# Patient Record
Sex: Male | Born: 1935 | Race: White | Hispanic: No | Marital: Married | Smoking: Never smoker
Health system: Southern US, Community
[De-identification: ages and names within clinical notes are randomized; demographics above are authoritative.]

## PROBLEM LIST (undated history)

## (undated) DIAGNOSIS — F419 Anxiety disorder, unspecified: Secondary | ICD-10-CM

## (undated) DIAGNOSIS — R251 Tremor, unspecified: Secondary | ICD-10-CM

## (undated) DIAGNOSIS — D62 Acute posthemorrhagic anemia: Secondary | ICD-10-CM

## (undated) DIAGNOSIS — G8929 Other chronic pain: Secondary | ICD-10-CM

## (undated) DIAGNOSIS — S02109A Fracture of base of skull, unspecified side, initial encounter for closed fracture: Secondary | ICD-10-CM

## (undated) DIAGNOSIS — M549 Dorsalgia, unspecified: Secondary | ICD-10-CM

## (undated) DIAGNOSIS — Z79899 Other long term (current) drug therapy: Secondary | ICD-10-CM

## (undated) DIAGNOSIS — F0390 Unspecified dementia without behavioral disturbance: Secondary | ICD-10-CM

## (undated) DIAGNOSIS — S065X9A Traumatic subdural hemorrhage with loss of consciousness of unspecified duration, initial encounter: Secondary | ICD-10-CM

## (undated) DIAGNOSIS — G629 Polyneuropathy, unspecified: Secondary | ICD-10-CM

## (undated) DIAGNOSIS — F329 Major depressive disorder, single episode, unspecified: Secondary | ICD-10-CM

## (undated) DIAGNOSIS — K5909 Other constipation: Secondary | ICD-10-CM

## (undated) DIAGNOSIS — N400 Enlarged prostate without lower urinary tract symptoms: Secondary | ICD-10-CM

## (undated) DIAGNOSIS — K219 Gastro-esophageal reflux disease without esophagitis: Secondary | ICD-10-CM

## (undated) DIAGNOSIS — R45851 Suicidal ideations: Secondary | ICD-10-CM

## (undated) DIAGNOSIS — S0292XA Unspecified fracture of facial bones, initial encounter for closed fracture: Secondary | ICD-10-CM

## (undated) DIAGNOSIS — E039 Hypothyroidism, unspecified: Secondary | ICD-10-CM

## (undated) DIAGNOSIS — I10 Essential (primary) hypertension: Secondary | ICD-10-CM

## (undated) DIAGNOSIS — M1990 Unspecified osteoarthritis, unspecified site: Secondary | ICD-10-CM

## (undated) DIAGNOSIS — N390 Urinary tract infection, site not specified: Secondary | ICD-10-CM

## (undated) DIAGNOSIS — F39 Unspecified mood [affective] disorder: Secondary | ICD-10-CM

## (undated) DIAGNOSIS — F32A Depression, unspecified: Secondary | ICD-10-CM

## (undated) DIAGNOSIS — C50912 Malignant neoplasm of unspecified site of left female breast: Secondary | ICD-10-CM

## (undated) HISTORY — DX: Suicidal ideations: R45.851

## (undated) HISTORY — PX: SKIN SURGERY: SHX2413

## (undated) HISTORY — PX: PROSTATE SURGERY: SHX751

## (undated) HISTORY — PX: EYE SURGERY: SHX253

## (undated) HISTORY — DX: Other constipation: K59.09

## (undated) HISTORY — DX: Unspecified fracture of facial bones, initial encounter for closed fracture: S02.92XA

## (undated) HISTORY — DX: Fracture of base of skull, unspecified side, initial encounter for closed fracture: S02.109A

## (undated) HISTORY — DX: Acute posthemorrhagic anemia: D62

## (undated) HISTORY — DX: Traumatic subdural hemorrhage with loss of consciousness of unspecified duration, initial encounter: S06.5X9A

## (undated) HISTORY — PX: APPENDECTOMY: SHX54

## (undated) HISTORY — PX: MULTIPLE TOOTH EXTRACTIONS: SHX2053

---

## 1998-10-01 ENCOUNTER — Emergency Department (HOSPITAL_COMMUNITY): Admission: EM | Admit: 1998-10-01 | Discharge: 1998-10-02 | Payer: Self-pay | Admitting: Emergency Medicine

## 1998-10-02 ENCOUNTER — Encounter: Payer: Self-pay | Admitting: Emergency Medicine

## 2000-02-24 ENCOUNTER — Encounter: Admission: RE | Admit: 2000-02-24 | Discharge: 2000-02-24 | Payer: Self-pay | Admitting: Oral Surgery

## 2000-02-24 ENCOUNTER — Encounter: Payer: Self-pay | Admitting: Oral Surgery

## 2000-02-25 ENCOUNTER — Ambulatory Visit (HOSPITAL_BASED_OUTPATIENT_CLINIC_OR_DEPARTMENT_OTHER): Admission: RE | Admit: 2000-02-25 | Discharge: 2000-02-25 | Payer: Self-pay | Admitting: Oral Surgery

## 2000-10-18 ENCOUNTER — Emergency Department (HOSPITAL_COMMUNITY): Admission: EM | Admit: 2000-10-18 | Discharge: 2000-10-18 | Payer: Self-pay | Admitting: Internal Medicine

## 2000-10-18 ENCOUNTER — Encounter: Payer: Self-pay | Admitting: Internal Medicine

## 2000-11-18 ENCOUNTER — Encounter: Admission: RE | Admit: 2000-11-18 | Discharge: 2000-11-18 | Payer: Self-pay | Admitting: Family Medicine

## 2000-11-18 ENCOUNTER — Encounter: Payer: Self-pay | Admitting: Family Medicine

## 2000-11-25 ENCOUNTER — Encounter: Payer: Self-pay | Admitting: Family Medicine

## 2000-11-25 ENCOUNTER — Encounter: Admission: RE | Admit: 2000-11-25 | Discharge: 2000-11-25 | Payer: Self-pay | Admitting: Family Medicine

## 2001-09-04 ENCOUNTER — Ambulatory Visit (HOSPITAL_COMMUNITY): Admission: RE | Admit: 2001-09-04 | Discharge: 2001-09-04 | Payer: Self-pay | Admitting: Family Medicine

## 2001-09-04 ENCOUNTER — Encounter: Payer: Self-pay | Admitting: Family Medicine

## 2002-10-24 ENCOUNTER — Emergency Department (HOSPITAL_COMMUNITY): Admission: EM | Admit: 2002-10-24 | Discharge: 2002-10-24 | Payer: Self-pay | Admitting: Emergency Medicine

## 2002-10-24 ENCOUNTER — Encounter: Payer: Self-pay | Admitting: Emergency Medicine

## 2007-12-18 ENCOUNTER — Emergency Department (HOSPITAL_COMMUNITY): Admission: EM | Admit: 2007-12-18 | Discharge: 2007-12-18 | Payer: Self-pay | Admitting: Emergency Medicine

## 2009-07-02 ENCOUNTER — Encounter: Admission: RE | Admit: 2009-07-02 | Discharge: 2009-07-02 | Payer: Self-pay | Admitting: Internal Medicine

## 2009-07-14 ENCOUNTER — Ambulatory Visit (HOSPITAL_COMMUNITY): Admission: RE | Admit: 2009-07-14 | Discharge: 2009-07-14 | Payer: Self-pay | Admitting: Internal Medicine

## 2010-08-07 NOTE — Op Note (Signed)
[place]. [facility]  Patient:    George Foster, George Foster                        MRN: [id number] Proc. Date: 02/25/00 Adm. Date:  [id number] Attending:  Retia Passe                           Operative Report  PREOPERATIVE DIAGNOSIS:  Nonrestorable teeth numbers 2, 4, 6, 12, 13, 18, 29, 20, 28, 29, mandibular right and left tori, mandibular anterior alveolar exostoses, maxillary right and left buccal exostoses.  POSTOPERATIVE DIAGNOSIS:  Nonrestorable teeth numbers 2, 4, 6, 12, 13, 18, 29, 20, 28, 29, mandibular right and left tori, mandibular anterior alveolar exostoses, maxillary right and left buccal exostoses.  OPERATION PERFORMED:  Removal of the above teeth, production of the maxillary and mandibular exostoses and tori.  SURGEON:  Vania Rea. Warren Danes, D.D.S.  ASSISTANT:  Gaynell Face.  ANESTHESIA:  General orotracheal.  ESTIMATED BLOOD LOSS:  Less than 50 cc.  FLUID REPLACEMENT:  Approximately 1000 cc crystalloid solution.  COMPLICATIONS:  None.  INDICATIONS FOR PROCEDURE:  The patient is a 77 year old gentleman who was referred to my office for evaluation and removal of the above teeth and reduction of the bony growths of the maxilla and mandible.  Due to the extent of the exostoses and tori, the procedure was performed at the [facility] under general anesthesia where the airway could be maintained during removal of the bony exostoses and tori.  DESCRIPTION OF PROCEDURE:  On February 25, 2000, George Foster was taken to the [facility] where he was placed on the operating table in a supine position.  Following successful oral endotracheal intubation and general anesthesia, the patients face, neck and oral cavity were prepped and draped in the usual sterile operating room fashion.  The hypopharynx was suctioned free of fluids and secretions, and a moistened 2 inch vaginal pack was placed as a throat pack.   Attention was then directed intraorally, where approximately 10 cc of 0.5% Xylocaine containing 1:200,000 epinephrine were infiltrated in the maxillary buccal soft tissues, the maxillary palatal soft tissues, and the right and left inferior alveolar neurovascular regions.  A #15 Bard-Parker blade was then used to create a full thickness mucoperiosteal incision along the crest of the alveolar gingival tissues beginning in the right posterior maxilla and brought across the midline and through the posterior left maxilla.  A full thickness mucoperiosteal flap was elevated laterally and palatally exposing the alveolar bone.  Multiple large exostoses were noted.  The maxillary teeth were then subluxated from the alveolus using an 11 A elevator, removed from the oral cavity using a 150 dental forcep.  The multiple exostoses were then reduced using a Stryker rotary osteotome and a #8 round bur followed by an alveolar bur.  The area was then thoroughly irrigated with sterile saline irrigating solutions and suctioned.  Mucoperiosteal margins were approximated and sutured in an interrupted fashion using 4-0 chromic suture material in a continuous interlocking fashion.  In a similar fashion, the mandibular teeth were removed, the exostoses and tori reduced.  Immediate dentures were then placed and found to fit appropriately.  The oral cavity was thoroughly suctioned and the throat pack removed.  The hypopharynx was suctioned free of fluids and secretions.  The patient was allowed to awaken from the anesthesia and taken to the recovery room  where he tolerated the procedure well and without apparent complication. DD:  02/25/00 TD:  02/25/00 Job: [id number]

## 2010-12-21 LAB — URINALYSIS, ROUTINE W REFLEX MICROSCOPIC
Glucose, UA: NEGATIVE
Hgb urine dipstick: NEGATIVE
Ketones, ur: NEGATIVE
Nitrite: NEGATIVE
Protein, ur: NEGATIVE
Specific Gravity, Urine: 1.017
Urobilinogen, UA: 0.2

## 2010-12-21 LAB — BASIC METABOLIC PANEL
BUN: 14
CO2: 27
Calcium: 8.8
Chloride: 108
Creatinine, Ser: 0.93
GFR calc Af Amer: >60
GFR calc non Af Amer: >60
Glucose, Bld: 108 — ABNORMAL HIGH

## 2010-12-21 LAB — DIFFERENTIAL
Basophils Absolute: 0
Eosinophils Absolute: 0.1
Eosinophils Relative: 2
Monocytes Absolute: 0.3

## 2010-12-21 LAB — CBC: RBC: 3.55 — ABNORMAL LOW

## 2010-12-21 LAB — URINE CULTURE: Colony Count: NO GROWTH

## 2010-12-24 ENCOUNTER — Emergency Department (HOSPITAL_COMMUNITY): Payer: Medicare Other

## 2010-12-24 ENCOUNTER — Observation Stay (HOSPITAL_COMMUNITY)
Admission: EM | Admit: 2010-12-24 | Discharge: 2010-12-28 | Disposition: A | Discharge status: Home / Self Care | Payer: Medicare Other | Attending: Internal Medicine | Admitting: Internal Medicine

## 2010-12-24 DIAGNOSIS — R404 Transient alteration of awareness: Principal | ICD-10-CM | POA: Insufficient documentation

## 2010-12-24 DIAGNOSIS — G319 Degenerative disease of nervous system, unspecified: Secondary | ICD-10-CM | POA: Insufficient documentation

## 2010-12-24 DIAGNOSIS — I10 Essential (primary) hypertension: Secondary | ICD-10-CM | POA: Insufficient documentation

## 2010-12-24 DIAGNOSIS — N179 Acute kidney failure, unspecified: Secondary | ICD-10-CM | POA: Insufficient documentation

## 2010-12-24 DIAGNOSIS — E039 Hypothyroidism, unspecified: Secondary | ICD-10-CM | POA: Insufficient documentation

## 2010-12-24 DIAGNOSIS — F411 Generalized anxiety disorder: Secondary | ICD-10-CM | POA: Insufficient documentation

## 2010-12-24 DIAGNOSIS — Z79899 Other long term (current) drug therapy: Secondary | ICD-10-CM | POA: Insufficient documentation

## 2010-12-24 DIAGNOSIS — G8929 Other chronic pain: Secondary | ICD-10-CM | POA: Insufficient documentation

## 2010-12-24 DIAGNOSIS — M549 Dorsalgia, unspecified: Secondary | ICD-10-CM | POA: Insufficient documentation

## 2010-12-24 LAB — URINALYSIS, ROUTINE W REFLEX MICROSCOPIC
Bilirubin Urine: NEGATIVE
Glucose, UA: NEGATIVE mg/dL
Hgb urine dipstick: NEGATIVE
Leukocytes, UA: NEGATIVE
pH: 6 (ref 5.0–8.0)

## 2010-12-24 LAB — DIFFERENTIAL
Eosinophils Relative: 1 % (ref 0–5)
Lymphocytes Relative: 15 % (ref 12–46)
Neutro Abs: 3.4 10*3/uL (ref 1.7–7.7)

## 2010-12-24 LAB — CBC
HCT: 33.6 % — ABNORMAL LOW (ref 39.0–52.0)
Hemoglobin: 11.4 g/dL — ABNORMAL LOW (ref 13.0–17.0)
MCH: 32.9 pg (ref 26.0–34.0)
MCHC: 33.9 g/dL (ref 30.0–36.0)
MCV: 96.8 fL (ref 78.0–100.0)
RBC: 3.47 MIL/uL — ABNORMAL LOW (ref 4.22–5.81)
RDW: 12.8 % (ref 11.5–15.5)

## 2010-12-24 LAB — COMPREHENSIVE METABOLIC PANEL
BUN: 20 mg/dL (ref 6–23)
CO2: 25 mEq/L (ref 19–32)
Calcium: 9.4 mg/dL (ref 8.4–10.5)
Chloride: 102 mEq/L (ref 96–112)
GFR calc Af Amer: 51 mL/min — ABNORMAL LOW (ref >90)
Sodium: 137 mEq/L (ref 135–145)
Total Bilirubin: 0.4 mg/dL (ref 0.3–1.2)

## 2010-12-25 ENCOUNTER — Observation Stay (HOSPITAL_COMMUNITY): Payer: Medicare Other

## 2010-12-25 LAB — TSH: TSH: 0.68 u[IU]/mL (ref 0.350–4.500)

## 2010-12-26 DIAGNOSIS — F411 Generalized anxiety disorder: Secondary | ICD-10-CM

## 2010-12-31 NOTE — Discharge Summary (Signed)
[name], [name] NO.:  [id number]  MEDICAL RECORD NO.:  [id number]  LOCATION:  1511                         FACILITY:  [facility]  PHYSICIAN:  Baltazar Najjar, MD     DATE OF BIRTH:  25-Nov-1924  DATE OF ADMISSION:  12/24/2010 DATE OF DISCHARGE:                              DISCHARGE SUMMARY   DISCHARGE DIAGNOSES: 1. Altered mental status secondary to polypharmacy. 2. Acute kidney injury. 3. Chronic back pain. 4. Anxiety. 5. History of hypothyroidism.  CONSULTATIONS:  Psychiatric consult.  The patient was seen by Dr. Lolly Mustache psychiatric service.  RADIOLOGY/IMAGING STUDIES:  Head CT showed negative for blood or any other acute intracranial process.  Atrophy and nonspecific white matter changes.  BRIEF ADMITTING HISTORY:  Please refer to H and P for more details.  On summary, [name] is an 77 year old man with history of chronic back pain on methadone.  He also has history of anxiety and takes Xanax, presented to the ER with altered mental status.  Please see H and P for more details.  H and P stated that the patient was brought into the hospital by his wife; however his wife with who is an RN told me that the patient was found wandering in the streets and was brought in by EMS.  As per wife also, he had been having problems with his memory in the past which was described as mild.  HOSPITAL COURSE: 1. Altered mental status thought to be secondary to polypharmacy.  We     adjusted his medication.  I decreased his Xanax to 0.5 mg p.o.     twice a day instead of 3 times a day.  I also decreased his     tramadol to 25 mg t.i.d. instead of 50 mg t.i.d.  We decreased his     gabapentin from 600 mg q.h.s. to 400 q.h.s.  We kept him on the     methadone and his wife does not want his dose to be changed.  With     this approach, the patient's mental status significantly improved.     He is alert, oriented x2, and his confusion was probably secondary     to  combination of those medications.  His head CT showed no acute     events.  MRI was ordered; however, that was not done given the     patient was claustrophobic and refused MRI.  He also was found to     have normal neurological exam with no focal deficits and his mental     status started to improve with adjustment of his medicine.  Psychiatric service was consulted and the patient was noted to have capacity to make his own decision and to be involved in his treatment. He was also found to have to both no threat to himself as Psych consultation.  His wife was initially was very upset and wanted to take the patient again to home against medical advise.  She was not happy that he was admitted and stated that her brother stays at home and watch him when she goes to work.  George Foster initially stated, when I spoke  to her on the phone, when I was phoned by RN that she does not want to wait for me to come in to see the patient and she was planning to take him immediately to home, even though I explained to her I will be coming shortly to see the patient.  So, according to that, I spoke to the Neurosurgeon, George Foster and I also spoke to George Foster with risk management to explain the situation.  The patient was seen by Psych and noted to have capacity as above and I also involved the social worker to rule out any kind of elderly abuse at home.  I do not really have any specific event to confirm that, however, the patient was found wandering on the street and I want to make sure we take all the measures to ensure his safety.  Noted that, afterwards the wife decided to keep him in the hospital, she was okay by me seeing the patient, so I saw the patient on rounds yesterday.  Please see progress note dated, 10/05.  At that time, the patient was still confused and I requested the psych consultation to check on his capacity and to see if they can help [place] with adjusting  his medication.  I also called his doctor, Dr. Murray Hodgkins, I left a message for him to discuss his case; however, I have not heard back from him yet.  Yesterday, as per staff, the patient needed 2 person assist to move him from the chair to the bed.  So, I requested PT/OT consultation which is still pending at the time of dictation.  As per wife and nursing staff today, the patient was able to ambulate with 1 person assist and his ambulation was much better; however, I still would like PT/OT to see him to see if he will need any kind of physical therapy at home prior to discharge.  We will discuss with the social worker for following that and arrange for surface as needed at home and also to offer the wife any kind of home health aide and to ensure the safety of the patient at home.  The patient seen and examined by me today.  He is alert, oriented x2, I am not sure what his baseline orientation, noted that he has history of memory issues in the past.  He denies any complaints.  His exam is unremarkable.  His vital signs, blood pressure 117/54, pulse rate of 68, temperature 98.2, O2 sat 96% on room air.  I also called his wife and discussed his status with her and she would like him to be discharged.  The patient is also requesting discharge home today.  I will discharge him today after his being seen by PT/OT.  1. Acute kidney injury.  The patient's creatinine was found to be at     1.4 on admission.  His baseline creatinine was normal prior to     that.  The patient was on Diovan at home that was held on admission     and I will discharge him on amlodipine 5 mg p.o. daily.  He will     need repeat basic metabolic panel to reassess his creatinine and if     his renal function is stabilized, he can be placed back on his     Diovan if indicated by his PCP.  DISCHARGE MEDICATION: 1. Amlodipine 5 mg p.o. daily. 2. Colace 100 mg p.o. twice daily. 3. MiraLax 17 grams  p.o. daily as  needed. 4. Xanax 0.5 mg p.o. twice daily. 5. Gabapentin 400 mg p.o. daily at bedtime. 6. Tramadol 50 mg half a tablet p.o. q.8 h. p.r.n. 7. Dutasteride-tamsulosin 1 tablet p.o. daily. 8. Haldol 0.5 mg 1 tablet p.o. twice daily. 9. Synthroid 100 mcg 1 tablet p.o. daily. 10.Methadone 10 mg tablets 1-1/2 to 2 tablets p.o. 3 times a day as     instructed.  As per wife, he takes 2 tablets in the morning, 1-1/2     tablet in the afternoon, and 1-1/2 tablet in the evening. 11.Omeprazole 40 mg p.o. daily.  DISCHARGE INSTRUCTIONS: 1. The patient to continue above medications as prescribed. 2. The patient to follow with his PCP ASAP or preferably within 1 week     of discharge. 3. The patient will need repeat basic metabolic panel to reassess his     kidney function. 4. Social worker to address home situation and to arrange for any     social worker/to address any home situation to ensure the patient's     safety and case manager to arrange for any home services that the     patient might need. 5. PT/OT to see the patient prior to discharge. 6. The patient/ wife to report any worsening of symptoms or any new     symptoms to PCP or come to the ER if needed.  CONDITION ON DISCHARGE:  Stable.          ______________________________Sosan Cleotis Lema, MD     SA/MEDQ  D:  12/27/2010  T:  12/27/2010  Job:  [id number]  cc:   Lenon Curt. Chilton Si, M.D. Fax: [phone]  Electronically Signed by Hannah Beat MD on 12/31/2010 08:23:11 AM

## 2011-01-06 NOTE — H&P (Signed)
NAME:  George Foster, George Foster NO.:  [id number]  MEDICAL RECORD NO.:  [id number]  LOCATION:  WLED                         FACILITY:  [facility]  PHYSICIAN:  Houston Siren, MD           DATE OF BIRTH:  Aug 23, 1924  DATE OF ADMISSION:  12/24/2010 DATE OF DISCHARGE:                             HISTORY & PHYSICAL   PRIMARY CARE PHYSICIAN:  Lenon Curt. Chilton Si, M.D.  ADVANCED DIRECTIVE:  Full code.  REASON FOR ADMISSION:  Altered mental status felt to be secondary to medication.  HISTORY OF PRESENT ILLNESS:  This is an 77 year old male with history of chronic severe back pain, anxiety, and hypothyroidism, brought in by his wife who is an Charity fundraiser for [employer] because of increased confusion today. She did say that he was having problem with his memory intermittently in the past, but it has been mild.  There has been no fever, chills, headache, or any focal neurological deficit.  It should be noted that he has been on methadone along with Xanax, allopurinol, and tramadol on a chronic basis. Generally, his wife gives him the medication.  Workup in emergency room included a head CT which was negative except for atrophy. Liver function tests, white count, and urinalysis were all normal.  His creatinine is 1.42 and blood sugar is 124.  Hospitalist was asked to admit the patient for observation because of transient worsening of his mental status.  He has no chest pain, shortness of breath, fever, chills, or any other symptomatology.  PAST MEDICAL HISTORY: 1. Hypothyroidism. 2. Anxiety. 3. Chronic back pain.  CURRENT MEDICATIONS: 1. Methadone 10 mg 2 tablets q.a.m., half tablet q.p.m., half tablet     q.h.s. 2. Xanax 0.5 mg t.i.d. 3. Allopurinol 0.5 mg b.i.d. 4. Diovan 160 mg per day. 5. Gabapentin. 6. Levoxyl 100 mcg per day. 7. Omeprazole. 8. Tramadol.  FAMILY HISTORY:  Noncontributory.  SOCIAL HISTORY:  He lives at home with his wife who is an Charity fundraiser.  SOCIAL HISTORY:  Denied  tobacco, alcohol, or drug use.  PHYSICAL EXAMINATION:  VITAL SIGNS:  Blood pressure 123/63, pulse of 85, respiratory rate of 23, temperature 98.4. GENERAL:  He is alert and oriented, knows that he is at [facility] and was able to tell a story that he was confused today trying to find his check book, when outside trying to get attention from a neighbor.  His story matched out okay and he knows that his wife is an Charity fundraiser.  He does have facial symmetry and fluent speech.  Tongue is midline. NECK:  Supple. HEENT:  Pupils equal, round, and reactive.  No carotid bruit. CARDIAC EXAM:  Revealed S1 and S2 regular. LUNGS:  Clear. EXTREMITIES:  His strength is equal bilaterally.  No calf tenderness. SKIN:  Warm and dry.  There is no rash. NEUROLOGIC:  Babinski is negative.  Strength is equal.  No asterixis.  LABORATORY STUDIES:  Serum sodium 137, potassium 4.8, glucose 124, creatinine 1.42, calcium of 9.4.  Urinalysis is negative.  White count 4,300, hemoglobin of 11.4, platelet count of 158,000.  Liver function tests are normal.  Head CT shows atrophy.  IMPRESSION:  This is an 77 year old with baseline mental status with memory loss, presented with acute transient confusion.  I suspect that medication-induced confusion is high on the list.  He is on methadone, Xanax, allopurinol, and tramadol.  We will admit.  I would like to at least get B12, RPR, and TSH.  We are trying to minimize his medication and discontinue those sedative ones.  I will reduce methadone to 5 mg t.i.d.  We will try to use Haldol if he is very agitated.  He is stable, otherwise, full code, and will be admitted to [facility] Rayfield Citizen, MD     PL/MEDQ  D:  12/24/2010  T:  12/24/2010  Job:  [id number]  Electronically Signed by Houston Siren  on 01/06/2011 02:00:29 AM

## 2011-01-21 ENCOUNTER — Emergency Department (HOSPITAL_COMMUNITY): Payer: Medicare Other

## 2011-01-21 ENCOUNTER — Emergency Department (HOSPITAL_COMMUNITY)
Admission: EM | Admit: 2011-01-21 | Discharge: 2011-01-21 | Disposition: A | Discharge status: Home / Self Care | Payer: Medicare Other | Attending: Internal Medicine | Admitting: Internal Medicine

## 2011-01-21 DIAGNOSIS — J189 Pneumonia, unspecified organism: Secondary | ICD-10-CM | POA: Insufficient documentation

## 2011-01-21 DIAGNOSIS — R259 Unspecified abnormal involuntary movements: Secondary | ICD-10-CM | POA: Insufficient documentation

## 2011-01-21 DIAGNOSIS — R4182 Altered mental status, unspecified: Secondary | ICD-10-CM | POA: Insufficient documentation

## 2011-01-21 DIAGNOSIS — E860 Dehydration: Secondary | ICD-10-CM | POA: Insufficient documentation

## 2011-01-21 LAB — CBC
HCT: 31.5 % — ABNORMAL LOW (ref 39.0–52.0)
Hemoglobin: 10.9 g/dL — ABNORMAL LOW (ref 13.0–17.0)
RBC: 3.22 MIL/uL — ABNORMAL LOW (ref 4.22–5.81)
WBC: 4.4 10*3/uL (ref 4.0–10.5)

## 2011-01-21 LAB — DIFFERENTIAL
Basophils Absolute: 0 10*3/uL (ref 0.0–0.1)
Basophils Relative: 0 % (ref 0–1)
Eosinophils Absolute: 0.2 10*3/uL (ref 0.0–0.7)
Monocytes Relative: 9 % (ref 3–12)

## 2011-01-21 LAB — URINALYSIS, ROUTINE W REFLEX MICROSCOPIC
Bilirubin Urine: NEGATIVE
Hgb urine dipstick: NEGATIVE
Ketones, ur: NEGATIVE mg/dL
Specific Gravity, Urine: 1.021 (ref 1.005–1.030)
Urobilinogen, UA: 0.2 mg/dL (ref 0.0–1.0)

## 2011-01-21 LAB — COMPREHENSIVE METABOLIC PANEL
ALT: 39 U/L (ref 0–53)
AST: 129 U/L — ABNORMAL HIGH (ref 0–37)
Alkaline Phosphatase: 68 U/L (ref 39–117)
CO2: 25 mEq/L (ref 19–32)
Calcium: 10 mg/dL (ref 8.4–10.5)
Chloride: 105 mEq/L (ref 96–112)
GFR calc Af Amer: 40 mL/min — ABNORMAL LOW (ref >90)
GFR calc non Af Amer: 34 mL/min — ABNORMAL LOW (ref >90)
Glucose, Bld: 117 mg/dL — ABNORMAL HIGH (ref 70–99)
Potassium: 4.6 mEq/L (ref 3.5–5.1)
Sodium: 139 mEq/L (ref 135–145)

## 2011-01-21 LAB — AMMONIA: Ammonia: 25 umol/L (ref 11–60)

## 2011-01-22 NOTE — Consult Note (Signed)
NAME:  George Foster, George Foster NO.:  [id number]  MEDICAL RECORD NO.:  [id number]  LOCATION:  WLED                         FACILITY:  [facility]  PHYSICIAN:  Jonny Ruiz, MD    DATE OF BIRTH:  1924-04-19  DATE OF CONSULTATION: DATE OF DISCHARGE:                                CONSULTATION   REQUESTING PHYSICIAN:  Dr. Dianne Dun.  REASON FOR CONSULTATION:  Change in mental status.  HISTORY OF PRESENT ILLNESS:  The patient is an 77 year old male recently discharged from our institution about 2 weeks ago because of change in mental status/confusion.  Today, he is brought back by his wife because of change in mental status, which is described as erratic behavior, wandering, insomnia, restlessness, occasional tremors and in general confused and disoriented.  In the emergency department, the patient was evaluated with blood work and x-rays, and he was found on portable chest x-ray with mild left lower lobe airspace disease, which might be atelectasis or possibly pneumonia.  No pleural effusion or mass lesion. No CHF.  Dr. Geanie Logan suspected the patient could be having a healthcare associated pneumonia and the patient was started on Cipro, Zosyn and vancomycin IV.  When I saw the patient, he was sitting in the chair, calm, he stood up and introduced himself to me and he was oriented x3. The patient acknowledged that he has been having episodes of confusion, disorientation, and doing things that he does not mean to do.  At some point, the patient was wandering around the house with a flashlight, which the patient acknowledged.  The wife tells me that his medication doses have been changed recently.  Dr. Chilton Si, his primary care physician, recently increased his Haldol to 0.5 mg in the morning and 1 mg in the evening, to manage his behavior; however, the wife being a nurse decided not to increase his dose of haloperidol in the evening. She is just giving him 0.5 mg b.i.d.  He is also  on Xanax as well as methadone for degenerative joint disease.  PAST MEDICAL HISTORY: 1. Hypothyroidism. 2. Degenerative joint disease. 3. Chronic back pain. 4. Anxiety disorder. 5. Dementia, probably Alzheimer type. 6. GERD.  CURRENT MEDICATIONS: 1. Haloperidol 0.5 mg b.i.d. 2. Methadone 10 mg 2 tablets in the morning, half tablet in the     afternoon, and half at bedtime. 3. Xanax 0.5 mg t.i.d., recently decreased to b.i.d. 4. Gabapentin. 5. Jalyn. 6. Levothyroxine. 7. Omeprazole. 8. Multivitamin 1 a day.  ALLERGIES:  No known drug allergies.  FAMILY HISTORY:  Noncontributory.  SOCIAL HISTORY:  The patient lives with his wife who is a Engineer, civil (consulting).  He denies tobacco, alcohol, or illicits.  He is a full code.  REVIEW OF SYSTEMS:  CONSTITUTIONAL:  No fever, chills, or sweats.  No weight loss.  Normal appetite.  ENT:  No sore throat, earache or runny nose.  No sinus tenderness.  CARDIOVASCULAR:  He denies chest pain, shortness of breath, palpitations, orthopnea, nocturia, PND or edema. RESPIRATORY:  He denies cough, shortness of breath, wheezes or sputum production.  GASTROINTESTINAL:  No abdominal pain, nausea, vomiting, diarrhea.  GU:  The patient has had urinary frequency for  a long time and also diminished urinary stream.  He was told by PCP he has an enlarged prostate.  NEUROLOGICAL:  He denies headaches, neck stiffness, no focal weakness or numbness.  LABORATORY ASSESSMENT:  Sodium is 139, potassium 4.6, chloride 105, carbon dioxide 25, glucose 117, BUN 38, creatinine 1.74, calcium 10, total protein 6.6, albumin 3.9, AST 129, ALT 39, alk phos 68, bilirubin total 0.5, ammonia 25.  The WBC is 4.4, hemoglobin 10.9, hematocrit 31.5, MCV 97.8, platelet count 164.  His EKG is unremarkable and his CT scan of the head shows atrophy with chronic microvascular ischemia.  No acute intracranial abnormality.  His UA is negative and chest x-ray as stated in the HPI.  IMPRESSION:  An  77 year old man with a history of dementia, presenting with change in behavior as well as fluctuating disorientation and insomnia.  Initially it was felt by the ED that he had pneumonia, however, the patient has been afebrile with no convincing evidence on physical examination and chest x-ray of pneumonia.  Unfortunately, he already received Zosyn, Cipro and vancomycin in the ED.  The patient has no evidence of ongoing infection at this time and therefore he should return home and be followed by his primary care physician, Dr. Neva Seat for further management of his medications intended to control his mood and behavior.  The patient has no indication for hospitalization at this time.          ______________________________ Jonny Ruiz, MD     GL/MEDQ  D:  01/21/2011  T:  01/21/2011  Job:  [id number]  cc:   Lenon Curt. Chilton Si, M.D. Fax: [phone]  Electronically Signed by Jonny Ruiz MD on 01/22/2011 02:12:57 PM

## 2011-06-06 ENCOUNTER — Encounter (HOSPITAL_COMMUNITY): Payer: Self-pay

## 2011-06-06 ENCOUNTER — Inpatient Hospital Stay (HOSPITAL_COMMUNITY)
Admission: EM | Admit: 2011-06-06 | Discharge: 2011-06-11 | DRG: 689 | Disposition: A | Discharge status: Other Acute Inpatient Hospital | Payer: Medicare Other | Attending: Internal Medicine | Admitting: Internal Medicine

## 2011-06-06 ENCOUNTER — Emergency Department (HOSPITAL_COMMUNITY): Payer: Medicare Other

## 2011-06-06 ENCOUNTER — Other Ambulatory Visit: Payer: Self-pay

## 2011-06-06 DIAGNOSIS — Y92009 Unspecified place in unspecified non-institutional (private) residence as the place of occurrence of the external cause: Secondary | ICD-10-CM

## 2011-06-06 DIAGNOSIS — R5381 Other malaise: Secondary | ICD-10-CM | POA: Diagnosis present

## 2011-06-06 DIAGNOSIS — N390 Urinary tract infection, site not specified: Secondary | ICD-10-CM

## 2011-06-06 DIAGNOSIS — F419 Anxiety disorder, unspecified: Secondary | ICD-10-CM | POA: Diagnosis present

## 2011-06-06 DIAGNOSIS — G8929 Other chronic pain: Secondary | ICD-10-CM

## 2011-06-06 DIAGNOSIS — F03918 Unspecified dementia, unspecified severity, with other behavioral disturbance: Secondary | ICD-10-CM | POA: Diagnosis present

## 2011-06-06 DIAGNOSIS — B9620 Unspecified Escherichia coli [E. coli] as the cause of diseases classified elsewhere: Secondary | ICD-10-CM | POA: Diagnosis present

## 2011-06-06 DIAGNOSIS — F0391 Unspecified dementia with behavioral disturbance: Secondary | ICD-10-CM

## 2011-06-06 DIAGNOSIS — R4182 Altered mental status, unspecified: Secondary | ICD-10-CM

## 2011-06-06 DIAGNOSIS — R45851 Suicidal ideations: Secondary | ICD-10-CM

## 2011-06-06 DIAGNOSIS — I10 Essential (primary) hypertension: Secondary | ICD-10-CM

## 2011-06-06 DIAGNOSIS — Z79899 Other long term (current) drug therapy: Secondary | ICD-10-CM | POA: Diagnosis present

## 2011-06-06 DIAGNOSIS — G92 Toxic encephalopathy: Secondary | ICD-10-CM | POA: Diagnosis present

## 2011-06-06 DIAGNOSIS — G929 Unspecified toxic encephalopathy: Secondary | ICD-10-CM | POA: Diagnosis present

## 2011-06-06 DIAGNOSIS — F411 Generalized anxiety disorder: Secondary | ICD-10-CM | POA: Diagnosis present

## 2011-06-06 DIAGNOSIS — W010XXA Fall on same level from slipping, tripping and stumbling without subsequent striking against object, initial encounter: Secondary | ICD-10-CM | POA: Diagnosis present

## 2011-06-06 DIAGNOSIS — T07XXXA Unspecified multiple injuries, initial encounter: Secondary | ICD-10-CM | POA: Diagnosis present

## 2011-06-06 DIAGNOSIS — A498 Other bacterial infections of unspecified site: Secondary | ICD-10-CM | POA: Diagnosis present

## 2011-06-06 DIAGNOSIS — E039 Hypothyroidism, unspecified: Secondary | ICD-10-CM | POA: Diagnosis present

## 2011-06-06 DIAGNOSIS — S41109A Unspecified open wound of unspecified upper arm, initial encounter: Secondary | ICD-10-CM | POA: Diagnosis present

## 2011-06-06 DIAGNOSIS — K219 Gastro-esophageal reflux disease without esophagitis: Secondary | ICD-10-CM | POA: Diagnosis present

## 2011-06-06 HISTORY — DX: Suicidal ideations: R45.851

## 2011-06-06 HISTORY — DX: Gastro-esophageal reflux disease without esophagitis: K21.9

## 2011-06-06 HISTORY — DX: Unspecified dementia, unspecified severity, without behavioral disturbance, psychotic disturbance, mood disturbance, and anxiety: F03.90

## 2011-06-06 HISTORY — DX: Other long term (current) drug therapy: Z79.899

## 2011-06-06 HISTORY — DX: Other chronic pain: G89.29

## 2011-06-06 HISTORY — DX: Dorsalgia, unspecified: M54.9

## 2011-06-06 HISTORY — DX: Unspecified osteoarthritis, unspecified site: M19.90

## 2011-06-06 HISTORY — DX: Hypothyroidism, unspecified: E03.9

## 2011-06-06 HISTORY — DX: Anxiety disorder, unspecified: F41.9

## 2011-06-06 HISTORY — DX: Essential (primary) hypertension: I10

## 2011-06-06 LAB — COMPREHENSIVE METABOLIC PANEL
CO2: 27 mEq/L (ref 19–32)
Calcium: 9.2 mg/dL (ref 8.4–10.5)
Creatinine, Ser: 1.21 mg/dL (ref 0.50–1.35)
GFR calc Af Amer: 61 mL/min — ABNORMAL LOW (ref >90)
GFR calc non Af Amer: 53 mL/min — ABNORMAL LOW (ref >90)
Glucose, Bld: 107 mg/dL — ABNORMAL HIGH (ref 70–99)
Total Protein: 6.5 g/dL (ref 6.0–8.3)

## 2011-06-06 LAB — URINE MICROSCOPIC-ADD ON

## 2011-06-06 LAB — CBC
Hemoglobin: 11.7 g/dL — ABNORMAL LOW (ref 13.0–17.0)
MCH: 33.3 pg (ref 26.0–34.0)
MCHC: 33.4 g/dL (ref 30.0–36.0)
MCV: 99.7 fL (ref 78.0–100.0)
Platelets: 148 10*3/uL — ABNORMAL LOW (ref 150–400)
RBC: 3.51 MIL/uL — ABNORMAL LOW (ref 4.22–5.81)

## 2011-06-06 LAB — URINALYSIS, ROUTINE W REFLEX MICROSCOPIC
Nitrite: POSITIVE — AB
Specific Gravity, Urine: 1.025 (ref 1.005–1.030)
Urobilinogen, UA: 1 mg/dL (ref 0.0–1.0)

## 2011-06-06 LAB — ETHANOL: Alcohol, Ethyl (B): <11 mg/dL (ref 0–11)

## 2011-06-06 MED ORDER — CEFTRIAXONE SODIUM 1 G IJ SOLR
1.0000 g | INTRAMUSCULAR | Status: DC
  Administered 2011-06-06 – 2011-06-07 (×2): 1 g via INTRAVENOUS
  Filled 2011-06-06 (×3): qty 10

## 2011-06-06 MED ORDER — ENOXAPARIN SODIUM 40 MG/0.4ML ~~LOC~~ SOLN
40.0000 mg | SUBCUTANEOUS | Status: DC
  Administered 2011-06-09 – 2011-06-10 (×2): 40 mg via SUBCUTANEOUS
  Filled 2011-06-06 (×6): qty 0.4

## 2011-06-06 MED ORDER — ACETAMINOPHEN 325 MG PO TABS
650.0000 mg | ORAL_TABLET | Freq: Four times a day (QID) | ORAL | Status: DC | PRN
  Administered 2011-06-07 – 2011-06-10 (×6): 650 mg via ORAL
  Filled 2011-06-06 (×7): qty 2

## 2011-06-06 MED ORDER — THERA M PLUS PO TABS: 1.0000 | ORAL_TABLET | Freq: Every day | ORAL | Status: DC

## 2011-06-06 MED ORDER — CIPROFLOXACIN HCL 500 MG PO TABS
500.0000 mg | ORAL_TABLET | Freq: Once | ORAL | Status: AC
  Administered 2011-06-06: 500 mg via ORAL
  Filled 2011-06-06: qty 1

## 2011-06-06 MED ORDER — ACETAMINOPHEN 650 MG RE SUPP: 650.0000 mg | Freq: Four times a day (QID) | RECTAL | Status: DC | PRN

## 2011-06-06 MED ORDER — SODIUM CHLORIDE 0.9 % IV SOLN
250.0000 mL | INTRAVENOUS | Status: DC | PRN
  Administered 2011-06-06: 250 mL via INTRAVENOUS

## 2011-06-06 MED ORDER — LORAZEPAM 1 MG PO TABS
1.0000 mg | ORAL_TABLET | Freq: Three times a day (TID) | ORAL | Status: DC | PRN
  Administered 2011-06-06 – 2011-06-09 (×4): 1 mg via ORAL
  Filled 2011-06-06 (×4): qty 1

## 2011-06-06 MED ORDER — GABAPENTIN 300 MG PO CAPS
600.0000 mg | ORAL_CAPSULE | Freq: Every day | ORAL | Status: DC
  Administered 2011-06-06 – 2011-06-10 (×5): 600 mg via ORAL
  Filled 2011-06-06 (×6): qty 2

## 2011-06-06 MED ORDER — GABAPENTIN 600 MG PO TABS
600.0000 mg | ORAL_TABLET | Freq: Every day | ORAL | Status: DC
  Filled 2011-06-06: qty 2

## 2011-06-06 MED ORDER — LEVOTHYROXINE SODIUM 100 MCG PO TABS
100.0000 ug | ORAL_TABLET | Freq: Every day | ORAL | Status: DC
  Administered 2011-06-07 – 2011-06-11 (×5): 100 ug via ORAL
  Filled 2011-06-06 (×6): qty 1

## 2011-06-06 MED ORDER — PANTOPRAZOLE SODIUM 40 MG PO TBEC
40.0000 mg | DELAYED_RELEASE_TABLET | Freq: Every day | ORAL | Status: DC
  Administered 2011-06-06 – 2011-06-11 (×5): 40 mg via ORAL
  Filled 2011-06-06 (×6): qty 1

## 2011-06-06 MED ORDER — TEMAZEPAM 15 MG PO CAPS
15.0000 mg | ORAL_CAPSULE | Freq: Every evening | ORAL | Status: DC | PRN
  Administered 2011-06-06 – 2011-06-08 (×2): 15 mg via ORAL
  Filled 2011-06-06 (×2): qty 1

## 2011-06-06 MED ORDER — SODIUM CHLORIDE 0.9 % IJ SOLN: 3.0000 mL | INTRAMUSCULAR | Status: DC | PRN

## 2011-06-06 MED ORDER — BISACODYL 5 MG PO TBEC
5.0000 mg | DELAYED_RELEASE_TABLET | Freq: Every day | ORAL | Status: DC | PRN
  Administered 2011-06-09 – 2011-06-10 (×2): 5 mg via ORAL
  Filled 2011-06-06 (×2): qty 1

## 2011-06-06 MED ORDER — METHADONE HCL 10 MG PO TABS
10.0000 mg | ORAL_TABLET | Freq: Three times a day (TID) | ORAL | Status: DC
  Administered 2011-06-06 – 2011-06-08 (×6): 10 mg via ORAL
  Filled 2011-06-06 (×6): qty 1

## 2011-06-06 MED ORDER — ONDANSETRON HCL 4 MG PO TABS: 4.0000 mg | ORAL_TABLET | Freq: Four times a day (QID) | ORAL | Status: DC | PRN

## 2011-06-06 MED ORDER — ONDANSETRON HCL 4 MG/2ML IJ SOLN: 4.0000 mg | Freq: Four times a day (QID) | INTRAMUSCULAR | Status: DC | PRN

## 2011-06-06 MED ORDER — ADULT MULTIVITAMIN W/MINERALS CH
1.0000 | ORAL_TABLET | Freq: Every day | ORAL | Status: DC
  Administered 2011-06-06 – 2011-06-11 (×6): 1 via ORAL
  Filled 2011-06-06 (×6): qty 1

## 2011-06-06 MED ORDER — SODIUM CHLORIDE 0.9 % IJ SOLN
3.0000 mL | Freq: Two times a day (BID) | INTRAMUSCULAR | Status: DC
  Administered 2011-06-06 – 2011-06-10 (×8): 3 mL via INTRAVENOUS
  Administered 2011-06-10: 21:00:00 via INTRAVENOUS

## 2011-06-06 NOTE — BH Assessment (Addendum)
Assessment Note   George Foster is an 78 y.o. male who was transported to [facility] Emergency Department with the chief complaint of "unusual behavior" and suicidal ideations per his spouse. Pt's spouse informed Clinical research associate that yesterday evening patient became very irate, throwing items in their home and "ranting and raving" for no apparent reason. Patient eventually calmed down, however later patient began to cut superficial lacerations on the front of his hands. Per spouse, "I have 3 little metal chinese fishes at home. He tore the fins off and used the metal piece to cut himself. I was terrified. I cleaned his wounds and bandaged them up."  Patient's spouse stated that this morning patient informed his wife that he was going to commit suicide which is when she transported him to [facility] for evaluation. Patient has a noted diagnosis of dementia and has no previous SI attempts or gestures per spouse. During assessment patient was a poor historian and unable to provide information in regards to the events that happen prior to being transported to the hospital. Patient continuously stated, "Please help my wife. She needs to get well. She wants to leave me. I love her too much for her to leave." Pt and pt's spouse were tearful several times during the assessment. Pt's spouse stated she has been married to pt for 31 years and that she recently filed for bankruptcy due to quitting her job to provide continuous care for her husband. "It's a lot on me. I can't sleep at night because I'm concerned about my safety and his safety." "My brother helps as much as he can but it's not enough." Patient's spouse reported that pt has never received any psychiatric treatment and that this is the first occurrence in which he cut himself. Patient has a limited support system and several financial stressors reported by his spouse.   Axis I: Dementia NOS Axis II: Deferred Axis III:  Past Medical History  Diagnosis Date  . Dementia     . Hypertension   . Arthritis    Axis IV: problems related to social environment Axis V: 41-50 serious symptoms  Past Medical History:  Past Medical History  Diagnosis Date  . Dementia   . Hypertension   . Arthritis     History reviewed. No pertinent past surgical history.  Family History: No family history on file.  Social History:  reports that he has never smoked. He does not have any smokeless tobacco history on file. He reports that he does not drink alcohol or use illicit drugs.  Additional Social History:  Alcohol / Drug Use History of alcohol / drug use?: No history of alcohol / drug abuse Allergies: Not on File  Home Medications:  Medications Prior to Admission  Medication Dose Route Frequency Provider Last Rate Last Dose  . ciprofloxacin (CIPRO) tablet 500 mg  500 mg Oral Once Shaaron Adler, PA-C   500 mg at 06/06/11 1257   No current outpatient prescriptions on file as of 06/06/2011.    OB/GYN Status:  No LMP for male patient.  General Assessment Data Location of Assessment: [facility] ED Living Arrangements: Spouse/significant other Can pt return to current living arrangement?: Yes Admission Status: Voluntary Is patient capable of signing voluntary admission?: No Transfer from: Acute Hospital Referral Source: Self/Family/Friend  Education Status Is patient currently in school?: No  Risk to self Suicidal Ideation: Yes-Currently Present Suicidal Intent: No-Not Currently/Within Last 6 Months Is patient at risk for suicide?: Yes Suicidal Plan?: No Access to  Means: Yes Specify Access to Suicidal Means: Access to sharp objects What has been your use of drugs/alcohol within the last 12 months?: N/A Previous Attempts/Gestures: No How many times?: 0  Triggers for Past Attempts: None known Intentional Self Injurious Behavior: Cutting Comment - Self Injurious Behavior: Superficial lacerations bilateral on hands  Family Suicide History: No Recent  stressful life event(s): Other (Comment) (Dementia ) Persecutory voices/beliefs?: No Depression: Yes Depression Symptoms: Tearfulness Substance abuse history and/or treatment for substance abuse?: No  Risk to Others Homicidal Ideation: No Thoughts of Harm to Others: No Current Homicidal Intent: No Current Homicidal Plan: No Access to Homicidal Means: No Identified Victim: None reported History of harm to others?: No Assessment of Violence: None Noted Does patient have access to weapons?: No Criminal Charges Pending?: No Does patient have a court date: No  Psychosis Hallucinations: None noted Delusions: None noted  Mental Status Report Appear/Hygiene: Other (Comment) (Appropriate) Eye Contact: Fair Motor Activity: Freedom of movement Speech: Tangential;Incoherent Level of Consciousness: Quiet/awake Mood: Depressed;Helpless;Sad Affect: Depressed Anxiety Level: None Thought Processes: Tangential Judgement: Impaired Orientation: Person;Place;Time Obsessive Compulsive Thoughts/Behaviors: None  Cognitive Functioning Concentration: Decreased Memory: Remote Intact IQ: Average Insight: Fair Impulse Control: Poor Appetite: Fair Weight Loss:  (Spouse stated pt has lost weight but is unsure of the amount) Weight Gain: 0  Sleep: Decreased Total Hours of Sleep: 1  (Per wife pt may sleep for 15 minutes and stay up for 48hrs) Vegetative Symptoms: None  Prior Inpatient Therapy Prior Inpatient Therapy: No  Prior Outpatient Therapy Prior Outpatient Therapy: No  ADL Screening (condition at time of admission) Patient's cognitive ability adequate to safely complete daily activities?: Yes Patient able to express need for assistance with ADLs?: Yes Communication: Independent Dressing (OT): Independent Grooming: Independent Feeding: Independent Bathing: Independent Toileting: Independent In/Out Bed: Independent Walks in Home: Independent (Used a walker in the past but no  longer requires it ) Weakness of Legs:  (Unable to observe. No weakness reported by spouse) Weakness of Arms/Hands: None  Home Assistive Devices/Equipment Home Assistive Devices/Equipment: Environmental consultant (specify type)  Therapy Consults (therapy consults require a physician order) PT Evaluation Needed: No OT Evalulation Needed: No Abuse/Neglect Assessment (Assessment to be complete while patient is alone) Physical Abuse: Denies Verbal Abuse: Denies Sexual Abuse: Denies Exploitation of patient/patient's resources: Denies Self-Neglect: Denies Values / Beliefs Cultural Requests During Hospitalization: None Spiritual Requests During Hospitalization: None Consults Spiritual Care Consult Needed: No      Additional Information 1:1 In Past 12 Months?: Yes CIRT Risk: No Elopement Risk: No Does patient have medical clearance?: No     Disposition:  Disposition Disposition of Patient: Referred to (Referral for medical admission by PA)  On Site Evaluation by:  Self Reviewed with Physician:  Lorenz Coaster, PA   Paulino Door, Adriel Desrosier C 06/06/2011 2:42 PM

## 2011-06-06 NOTE — ED Notes (Signed)
BELONGINGS IN [address] IN TCU

## 2011-06-06 NOTE — ED Notes (Signed)
Pt Belongings in [place] [address]. Two bags two rings one gold band and one gold band with black.

## 2011-06-06 NOTE — ED Provider Notes (Signed)
History     CSN: [id number]  Arrival date & time 06/06/11  1018   First MD Initiated Contact with Patient 06/06/11 1111      Chief Complaint  Patient presents with  . Fall    (Consider location/radiation/quality/duration/timing/severity/associated sxs/prior treatment) The history is provided by the patient and the spouse. The history is limited by the condition of the patient.  78 y/o M who presents with c/c of fall and depression with suicidal ideation. The fall occurred yesterday; pt lost his footing while walking on gravel. It was a witnessed fall and there was no loss of consciousness. Pt reports he hit his head in the fall, though his wife disagrees. He has been complaining of right foot pain and right arm pain since the fall. He has been ambulatory since the fall. There was no treatment PTA. Pt wife also notes 6 months of gradually worsening depression with a hx of dementia (for which she reports he has not had any outpatient evaluation but rather was dx in ED). He has generally poor sleep habits, with very little sleep the last several nights. In the last few days, he has been expressing suicidal ideation with self-inflicted wounds to BUE. Pt relates to me that he is worried about losing his wife and doesn't want to live without her. Per wife, his dose of ativan to help sleep at night was recently increased from 1mg  to 2mg , though medication reconciliation has not been performed by pharmacy at this point. The patient and spouse deny homicidal ideation or hallucinations. Wife does note that this behavior is not typical for the patient and she is concerned he is rapidly declining.  Past Medical History  Diagnosis Date  . Dementia   . Hypertension   . Arthritis     History reviewed. No pertinent past surgical history.  No family history on file.  History  Substance Use Topics  . Smoking status: Never Smoker   . Smokeless tobacco: Not on file  . Alcohol Use: No      Review  of Systems  Unable to perform ROS: Dementia  Respiratory: Negative for cough and shortness of breath.   Cardiovascular: Negative for chest pain and leg swelling.  Musculoskeletal: Negative for back pain.       See HPI  Skin: Positive for wound.  Neurological: Positive for headaches.  Psychiatric/Behavioral: Positive for confusion.       See HPI    Allergies  Review of patient's allergies indicates not on file.  Home Medications  No current outpatient prescriptions on file.  BP 126/59  Pulse 88  Temp(Src) 98.5 F (36.9 C) (Oral)  Resp 16  SpO2 97%  Physical Exam  Nursing note and vitals reviewed. Constitutional: He appears well-developed and well-nourished. Distressed: tearful, though in no distress.  HENT:  Head: Normocephalic and atraumatic.  Right Ear: External ear normal.  Left Ear: External ear normal.  Mouth/Throat: Oropharynx is clear and moist.  Eyes: Conjunctivae and EOM are normal. Pupils are equal, round, and reactive to light.  Neck: Normal range of motion. Neck supple.  Cardiovascular: Normal rate, regular rhythm, normal heart sounds and intact distal pulses.   Pulmonary/Chest: Breath sounds normal. No respiratory distress. He has no wheezes. He exhibits no tenderness.  Abdominal: Soft. Bowel sounds are normal. He exhibits no distension. There is no tenderness. There is no rebound and no guarding.  Musculoskeletal: Normal range of motion. He exhibits no edema.       Legs:  Right foot: He exhibits tenderness. He exhibits normal range of motion, no swelling, normal capillary refill and no deformity.       Feet:  Skin: Skin is warm and dry.       Abrasions to dorsal and volar aspect of bilateral hands and forearms without bleeding or wound drainage    ED Course  Procedures (including critical care time)  Labs Reviewed  CBC - Abnormal; Notable for the following:    RBC 3.51 (*)    Hemoglobin 11.7 (*)    HCT 35.0 (*)    Platelets 148 (*)    All other  components within normal limits  COMPREHENSIVE METABOLIC PANEL - Abnormal; Notable for the following:    Glucose, Bld 107 (*)    GFR calc non Af Amer 53 (*)    GFR calc Af Amer 61 (*)    All other components within normal limits  URINALYSIS, ROUTINE W REFLEX MICROSCOPIC - Abnormal; Notable for the following:    Color, Urine AMBER (*) BIOCHEMICALS MAY BE AFFECTED BY COLOR   APPearance TURBID (*)    Hgb urine dipstick MODERATE (*)    Bilirubin Urine SMALL (*)    Ketones, ur TRACE (*)    Protein, ur 30 (*)    Nitrite POSITIVE (*)    Leukocytes, UA LARGE (*)    All other components within normal limits  URINE MICROSCOPIC-ADD ON - Abnormal; Notable for the following:    Bacteria, UA FEW (*)    Casts HYALINE CASTS (*)    Crystals CA OXALATE CRYSTALS (*)    All other components within normal limits  ETHANOL  URINE RAPID DRUG SCREEN (HOSP PERFORMED)  URINE CULTURE   Dg Chest 2 View  06/06/2011  *RADIOLOGY REPORT*  Clinical Data: Medical clearance  CHEST - 2 VIEW  Comparison: 01/21/2011  Findings: Cardiomediastinal silhouette is stable.  No acute infiltrate or pleural effusion.  Stable mild elevation of the left hemidiaphragm with left basilar atelectasis or scarring.  Mild degenerative changes thoracic spine.  IMPRESSION: No active disease.  Mild elevation of the left hemidiaphragm with left basilar atelectasis or scarring.  Original Report Authenticated By: Natasha Mead, M.D.   Ct Head Wo Contrast  06/06/2011  *RADIOLOGY REPORT*  Clinical Data:  Fall, right leg pain  CT HEAD WITHOUT CONTRAST CT CERVICAL SPINE WITHOUT CONTRAST  Technique:  Multidetector CT imaging of the head and cervical spine was performed following the standard protocol without intravenous contrast.  Multiplanar CT image reconstructions of the cervical spine were also generated.  Comparison:  01/21/2011  CT HEAD  Findings:  No skull fracture is noted.  Paranasal sinuses and mastoid air cells are unremarkable.  No intracranial  hemorrhage, mass effect or midline shift.  No acute infarction.  No mass lesion is noted on this unenhanced scan.  Stable cerebral atrophy.  Stable periventricular and subcortical chronic white matter disease.  Ventricular size is stable from prior exam.  Impression: 1.  No acute intracranial abnormality.  Stable atrophy and chronic white matter disease.  CT CERVICAL SPINE  Findings:  Axial images shows no acute fracture or subluxation. There is diffuse osteopenia.  There is no pneumothorax in visualized lung apices.  Computer processed images shows no acute fracture or subluxation.  There is diffuse osteopenia.  Degenerative changes are noted C1-C2 articulation.  Large anterior osteophyte noted lower endplate of the C3 vertebral body.  There is disc space flattening with mild anterior and posterior spurring at C4-C5 C5-C6 and C6-C7 level.  Anterior spurring noted at C 7-T1 level.  There is mild spinal canal stenosis at C4-C5 and C5-C6 level due to posterior osteophytes and calcifications of posterior longitudinal ligament. No prevertebral soft tissue swelling.  Cervical airway is patent.  IMPRESSION:  1.  No acute fracture or subluxation.  Diffuse osteopenia. Degenerative changes as described above.  Original Report Authenticated By: Natasha Mead, M.D.   Ct Cervical Spine Wo Contrast  06/06/2011  *RADIOLOGY REPORT*  Clinical Data:  Fall, right leg pain  CT HEAD WITHOUT CONTRAST CT CERVICAL SPINE WITHOUT CONTRAST  Technique:  Multidetector CT imaging of the head and cervical spine was performed following the standard protocol without intravenous contrast.  Multiplanar CT image reconstructions of the cervical spine were also generated.  Comparison:  01/21/2011  CT HEAD  Findings:  No skull fracture is noted.  Paranasal sinuses and mastoid air cells are unremarkable.  No intracranial hemorrhage, mass effect or midline shift.  No acute infarction.  No mass lesion is noted on this unenhanced scan.  Stable cerebral  atrophy.  Stable periventricular and subcortical chronic white matter disease.  Ventricular size is stable from prior exam.  Impression: 1.  No acute intracranial abnormality.  Stable atrophy and chronic white matter disease.  CT CERVICAL SPINE  Findings:  Axial images shows no acute fracture or subluxation. There is diffuse osteopenia.  There is no pneumothorax in visualized lung apices.  Computer processed images shows no acute fracture or subluxation.  There is diffuse osteopenia.  Degenerative changes are noted C1-C2 articulation.  Large anterior osteophyte noted lower endplate of the C3 vertebral body.  There is disc space flattening with mild anterior and posterior spurring at C4-C5 C5-C6 and C6-C7 level. Anterior spurring noted at C 7-T1 level.  There is mild spinal canal stenosis at C4-C5 and C5-C6 level due to posterior osteophytes and calcifications of posterior longitudinal ligament. No prevertebral soft tissue swelling.  Cervical airway is patent.  IMPRESSION:  1.  No acute fracture or subluxation.  Diffuse osteopenia. Degenerative changes as described above.  Original Report Authenticated By: Natasha Mead, M.D.   Dg Foot Complete Right  06/06/2011  *RADIOLOGY REPORT*  Clinical Data: Fall, right foot pain  RIGHT FOOT COMPLETE - 3+ VIEW  Comparison: None.  Findings: Four views of the right foot submitted.  No acute fracture or subluxation.  There is diffuse osteopenia.  Plantar spur of the calcaneus is noted.  IMPRESSION: No acute fracture or subluxation.  Diffuse osteopenia.  Plantar spur of the calcaneus.  Original Report Authenticated By: Natasha Mead, M.D.    Date: 06/06/2011  Rate: 74  Rhythm: sinus  QRS Axis: normal  Intervals: normal  ST/T Wave abnormalities: normal  Conduction Disutrbances:none  Narrative Interpretation:   Old EKG Reviewed: unchanged    1. UTI (urinary tract infection)   2. Altered mental status   3. Suicidal ideation       MDM  11:30 AM Pt seen and evaluated.  Initial H&P complete. Depression with SI vs worsening dementia. Fall that appears mechanical with questionable head injury. Medical clearance/screening labs, CXR, CT head and neck ordered. Will continue to monitor to determine whether pt will require medical vs psychiatric admission.    2:15 PM Urinalysis with infection. Other labs reviewed with only stable anemia. CXR, right foot x-ray, CT head and c-spine reviewed without any acute findings. Pt will be admitted to hospital for tx of UTI and further testing/treatment of altered mental status.        Judeth Cornfield  Phillips Hay, PA-C 06/06/11 1601

## 2011-06-06 NOTE — ED Provider Notes (Signed)
Medical screening examination/treatment/procedure(s) were conducted as a shared visit with non-physician practitioner(s) and myself.  I personally evaluated the patient during the encounter.  Pt with delirium, worse in the past weeks, evidence of sig UTI without being septic.  No h/o psychosis previously.  Will admit, start on oral abx, likely would benefit from psych consultation as well.  Head CT is neg for new lesions, trauma.  VS ok.   George Foster. Hughey Rittenberry, MD 06/06/11 1630

## 2011-06-06 NOTE — ED Notes (Signed)
Patient here with spouse this am who reports that patient has been up all night and fell due to same. Complains of right leg and foot pain. Patient also has self inflicted superficial lacerations to bilateral arms and hands-spouse reports that this am made suicidal threats, patient denies

## 2011-06-06 NOTE — H&P (Addendum)
Hospital Admission Note Date: 06/06/2011  Patient name: George Foster Medical record number: [id number] Date of birth: Mar 25, 1924 Age: 78 y.o. Gender: male PCP: GREEN, Lenon Curt, MD, MD  Attending physician: Maryruth Bun Karelyn Brisby, MD Emergency Contact: George Foster, Krone Spouse [phone]  Code Status:  Full code  Chief Complaint: Altered mental status  History of Present Illness: George Foster is an 78 y.o. male with a PMH of dementia who was brought to the hospital with altered mental status.  According to the patient's wife (who is no longer present, and who was not available by telephone), Mr. Apfel has been depressed for the past 6 months. He has had poor sleeping hygiene and was put on Ativan by his primary care physician. He Ativan dose was recently increased, and he became self destructive. Patient cut his upper arms and apparently, expressed suicidal ideation to his wife. The patient freely admits that he intentionally cut himself over "a woman". According to the emergency department physician's notes, he was worried about losing his wife and didn't want to live without her. Results of the history of a mechanical fall within the past 24 hours where the patient lost his footing while walking on gravel. The fall was witnessed and there was no loss of consciousness. Patient claims to have hit his head in the fall, but his wife, who witnessed the fall, and states that he did not. The patient is currently cooperative and answering questions appropriately.  Past Medical History Past Medical History  Diagnosis Date  . Dementia   . Hypertension   . Arthritis   . Hypothyroidism   . Chronic back pain   . Anxiety disorder   . Gastroesophageal reflux disease   . Polypharmacy     History of hospitalizations for altered mental status related to medications    Past Surgical History Past Surgical History  Procedure Date  . Appendectomy   . Removal of nonrestorable teeth numbers 2, 4, 6, 12, 13, 18,  29     Meds: Prior to Admission medications   Medication Sig Start Date End Date Taking? Authorizing Provider  bisacodyl (BISACODYL) 5 MG EC tablet Take 5 mg by mouth daily as needed. For constipation.   Yes Historical Provider, MD  gabapentin (NEURONTIN) 600 MG tablet Take 600-1,200 mg by mouth at bedtime.   Yes Historical Provider, MD  levothyroxine (SYNTHROID, LEVOTHROID) 100 MCG tablet Take 100 mcg by mouth daily.   Yes Historical Provider, MD  LORazepam (ATIVAN) 1 MG tablet Take 1 mg by mouth every 8 (eight) hours as needed. For nerves.   Yes Historical Provider, MD  methadone (DOLOPHINE) 10 MG tablet Take 10 mg by mouth every 8 (eight) hours.   Yes Historical Provider, MD  Multiple Vitamins-Minerals (MULTIVITAMINS THER. W/MINERALS) TABS Take 1 tablet by mouth daily.   Yes Historical Provider, MD  omeprazole (PRILOSEC) 40 MG capsule Take 40 mg by mouth daily.   Yes Historical Provider, MD  temazepam (RESTORIL) 15 MG capsule Take 15 mg by mouth at bedtime as needed. For sleep.   Yes Historical Provider, MD    Allergies: Review of patient's allergies indicates not on file.  Social History: History   Social History  . Marital Status: Married    Spouse Name: Ginny    Number of Children: 5  . Years of Education: 12   Occupational History  . Retired Merchandiser, retail in a [employer]    Social History Main Topics  . Smoking status: Never Smoker   . Smokeless  tobacco: Never Used  . Alcohol Use: No     Pt denies  . Drug Use: No     Pt denies  . Sexually Active: Not on file   Other Topics Concern  . Not on file   Social History Narrative   Married.  Lives in [place] with wife.  Ambulates with a walker.    Family History:  Family History  Problem Relation Age of Onset  . Heart disease Father     Review of Systems: Constitutional: + occasional fever or chills;  Appetite normal; +weight loss of 60 lbs in past 6 months, in part intentional.  HEENT: No blurry vision or diplopia, +  poor visual acuity, no pharyngitis or dysphagia CV: No chest pain or arrhythmia.  Resp: No SOB, no cough. GI: No N/V, no diarrhea, no melena or hematochezia.  GU: + dysuria, + frequency, + hesitancy,  no hematuria.  MSK: +generalized joint aches and muscle pains.  Neuro:  + headache, no focal neurological deficits.  Psych: No depression or anxiety.  Endo: + thyroid disease, no DM.  Skin: No rashes or lesions.  Heme: No anemia or blood dyscrasia   Physical Exam: Blood pressure 126/59, pulse 88, temperature 98.5 F (36.9 C), temperature source Oral, resp. rate 16, SpO2 97.00%. BP 126/59  Pulse 88  Temp(Src) 98.5 F (36.9 C) (Oral)  Resp 16  SpO2 97%  General Appearance:    Alert, cooperative, no distress, appears stated age  Head:    Normocephalic, without obvious abnormality, atraumatic  Eyes:    PERRL, conjunctiva/corneas clear, EOM's intact      Ears:    Normal external ear canals, both ears  Nose:   Nares normal, septum midline, mucosa normal, no drainage    or sinus tenderness  Throat:   Lips, mucosa, and tongue normal; edentulous   Neck:   Supple, symmetrical, trachea midline, no adenopathy;       thyroid:  No enlargement/tenderness/nodules; no carotid   bruit or JVD  Back:     Symmetric, no curvature, ROM normal, no CVA tenderness  Lungs:     Clear to auscultation bilaterally, respirations unlabored  Chest wall:    No tenderness or deformity  Heart:    Regular rate and rhythm, S1 and S2 normal, no murmur, rub   or gallop  Abdomen:     Soft, non-tender, bowel sounds active all four quadrants,    no masses, no organomegaly  Extremities:   Extremities normal, atraumatic, no cyanosis or edema  Pulses:   2+ and symmetric all extremities  Skin:   Skin color, texture, turgor normal, no rashes or lesions  Lymph nodes:   Cervical, supraclavicular, and axillary nodes normal  Neurologic:   CNII-XII intact. Normal strength.   Lab results: Basic Metabolic Panel:  Lab 06/06/11 9562  NA  138  K 4.4  CL 103  CO2 27  GLUCOSE 107*  BUN 22  CREATININE 1.21  CALCIUM 9.2  MG --  PHOS --   GFR CrCl is unknown because there is no height on file for the current visit. Liver Function Tests:  Lab 06/06/11 1120  AST 20  ALT 12  ALKPHOS 67  BILITOT 0.4  PROT 6.5  ALBUMIN 3.9    CBC:  Lab 06/06/11 1120  WBC 5.6  NEUTROABS --  HGB 11.7*  HCT 35.0*  MCV 99.7  PLT 148*   Urinalysis:  Ref. Range 06/06/2011 11:46  Color, Urine Latest Range: YELLOW  AMBER (A)  APPearance Latest Range: CLEAR  TURBID (A)  Specific Gravity, Urine Latest Range: 1.005-1.030  1.025  pH Latest Range: 5.0-8.0  5.5  Glucose, UA Latest Range: NEGATIVE mg/dL NEGATIVE  Bilirubin Urine Latest Range: NEGATIVE  SMALL (A)  Ketones, ur Latest Range: NEGATIVE mg/dL TRACE (A)  Protein Latest Range: NEGATIVE mg/dL 30 (A)  Urobilinogen, UA Latest Range: 0.0-1.0 mg/dL 1.0  Nitrite Latest Range: NEGATIVE  POSITIVE (A)  Leukocytes, UA Latest Range: NEGATIVE  LARGE (A)  Hgb urine dipstick Latest Range: NEGATIVE  MODERATE (A)  WBC, UA Latest Range: <3 WBC/hpf TOO NUMEROUS TO COUNT  RBC / HPF Latest Range: <3 RBC/hpf 0-2  Bacteria, UA Latest Range: RARE  FEW (A)  Crystals Latest Range: NEGATIVE  CA OXALATE CRYSTALS (A)  Casts Latest Range: NEGATIVE  HYALINE CASTS (A)    12-lead WUJ:WJXBJ RHYTHM ~ normal P axis, V-rate 50- 99  Imaging results:   Dg Chest 2 View 06/06/2011 IMPRESSION: No active disease.  Mild elevation of the left hemidiaphragm with left basilar atelectasis or scarring.  Original Report Authenticated By: Natasha Mead, M.D.    Ct Head Wo Contrast 06/06/2011  Impression: 1.  No acute intracranial abnormality.  Stable atrophy and chronic white matter disease.     Ct Cervical Spine Wo Contrast 06/06/2011 IMPRESSION:  1.  No acute fracture or subluxation.  Diffuse osteopenia. Degenerative changes as described above.  Original Report Authenticated By: Natasha Mead, M.D.    Dg Foot Complete Right  06/06/2011  IMPRESSION: No acute fracture or subluxation.  Diffuse osteopenia.  Plantar spur of the calcaneus.  Original Report Authenticated By: Natasha Mead, M.D.    Assessment & Plan: Principal Problem:  *Toxic encephalopathy The patient's altered mental status appears to be due to toxic encephalopathy from a urinary tract infection in the setting of dementia. We will treat his urinary tract infection with IV Rocephin and followup on urine cultures, sent on admission. Given his concurrent suicidal ideation, would also get a psychiatry evaluation. Would also do a reversible causes of dementia workup including a check of his TSH, RPR, and B12. Active Problems:  Urinary tract infection Urine cultures were sent. The patient was given one dose of Cipro in the emergency department. We'll place him on IV Rocephin, 1 g daily.  Dementia with behavioral disturbance /  Suicidal ideation The patient's mental status is currently very clear. Given his recent self-mutilation, we'll get a psychiatry consult. We'll place on suicide precautions.  Hypertension The patient is not on any antihypertensives at baseline and his blood pressure is currently normal. We'll continue to monitor.  Hypothyroidism We'll check the patient's TSH to ensure that he is appropriately replaced and continue his current dose of Synthroid.  Gastroesophageal reflux disease We'll continue the patient's PPI therapy.  Anxiety disorder We'll continue when necessary Ativan, but only give 1 mg every 8 hours as needed. If he becomes agitated, would prefer to use an antipsychotic medication.  Polypharmacy Patient's current medications have been reviewed. We'll lower his dose of Ativan. Would continue his current dose of methadone at 10 mg every 8 hours as he would be at high-risk for withdrawal if we were to stop this.  Wounds, multiple Patient's upper extremity wounds appear to be superficial and not infected. We'll apply Neosporin but leave  open to air.   Prophylaxis: Lovenox has been ordered for DVT prophylaxis.  Time Spent On Admission: One hour  Patsey Pitstick 06/06/2011, 3:39 PM Pager [phone]

## 2011-06-07 LAB — BASIC METABOLIC PANEL
CO2: 27 mEq/L (ref 19–32)
Calcium: 8.8 mg/dL (ref 8.4–10.5)
Creatinine, Ser: 1.09 mg/dL (ref 0.50–1.35)
Glucose, Bld: 93 mg/dL (ref 70–99)

## 2011-06-07 LAB — CBC
MCH: 33 pg (ref 26.0–34.0)
MCV: 100 fL (ref 78.0–100.0)
Platelets: 164 10*3/uL (ref 150–400)
RDW: 14.1 % (ref 11.5–15.5)

## 2011-06-07 MED ORDER — KETOROLAC TROMETHAMINE 30 MG/ML IJ SOLN
30.0000 mg | Freq: Four times a day (QID) | INTRAMUSCULAR | Status: DC | PRN
  Administered 2011-06-07 (×2): 30 mg via INTRAVENOUS
  Filled 2011-06-07: qty 1

## 2011-06-07 MED ORDER — KETOROLAC TROMETHAMINE 30 MG/ML IJ SOLN
INTRAMUSCULAR | Status: AC
  Filled 2011-06-07: qty 1

## 2011-06-07 NOTE — Progress Notes (Addendum)
PROGRESS NOTE  George Foster EAV:[id number] DOB: Nov 03, 1924 DOA: 06/06/2011 PCP: Kimber Relic, MD, MD  Brief narrative: George Foster is a 78 year old man with a past medical history of dementia who was admitted on 06/06/2011 with suicidal ideation, self mutilation, and toxic encephalopathy from a urinary tract infection. He is currently being maintained with a Recruitment consultant and awaiting formal psychiatry evaluation.  Assessment/Plan: Principal Problem:  *Toxic encephalopathy  The patient's altered mental status appears to be due to toxic encephalopathy from a urinary tract infection in the setting of dementia. We are treating his urinary tract infection with IV Rocephin and will followup on urine cultures, sent on admission. Given his concurrent suicidal ideation, we requested a psychiatry evaluation, which is pending. We also ordered a reversible causes of dementia workup including a check of his TSH which was within normal limits, RPR which was nonreactive and B12, which was within normal limits.  Active Problems:  Urinary tract infection  Urine cultures were sent. The patient was given one dose of Cipro in the emergency department. He is now on Rocephin IV daily.  Dementia with behavioral disturbance / Suicidal ideation  Given his recent self-mutilation, we ordered a psychiatry consult. He remains on suicide precautions.  Hypertension  The patient is not on any antihypertensives at baseline and his blood pressure is currently normal. He did have one high reading overnight. We'll continue to monitor.  Hypothyroidism  Appropriately replaced on his current dose of Synthroid, given his normal TSH. Gastroesophageal reflux disease  We'll continue the patient's PPI therapy.  Anxiety disorder  We'll continue when necessary Ativan, but only give 1 mg every 8 hours as needed. If he becomes agitated, would prefer to use an antipsychotic medication.  Polypharmacy  Patient's current medications have been  reviewed. We'll lower his dose of Ativan. Would continue his current dose of methadone at 10 mg every 8 hours as he would be at high-risk for withdrawal if we were to stop this.  Wounds, multiple  Patient's upper extremity wounds appear to be superficial and not infected. We'll apply Neosporin but leave open to air.   Code Status: Full code Family Communication: George Foster Spouse [phone]; Called and left message 06/07/11. Disposition Plan: Home anticipated but will await formal psychiatry evaluation.  Consultants:  Psychiatry consult pending.  Procedures:  12-lead EKG 06/06/2011: SINUS RHYTHM ~ normal P axis, V-rate 50- 99   Antibiotics:  Cipro 06/06/2011----> 06/06/2011  Rocephin 06/06/2011--->   Subjective  George Foster complains of a headache and ongoing occasional dysuria. He is sitting up and eating well otherwise.   Objective    Interim History:  George Foster was stable overnight.  Objective: Filed Vitals:   06/06/11 1632 06/06/11 1700 06/06/11 2125 06/07/11 0525  BP: 126/57 171/96 127/74 107/66  Pulse: 75 73 69 62  Temp:  98.3 F (36.8 C) 97.9 F (36.6 C) 98 F (36.7 C)  TempSrc:  Oral Oral Oral  Resp: 18  16 18   Height:  5' 9.5" (1.765 m)    Weight:  73.3 kg (161 lb 9.6 oz)    SpO2: 99% 98% 96% 95%    Intake/Output Summary (Last 24 hours) at 06/07/11 0757 Last data filed at 06/06/11 2300  Gross per 24 hour  Intake    103 ml  Output    200 ml  Net    -97 ml    Exam: Gen:  NAD Cardiovascular:  RRR, No M/R/G Respiratory: Lungs CTAB Gastrointestinal: Abdomen soft, NT/ND with  normal active bowel sounds. Extremities: No C/E/C Psychiatry: Mood and affect stable    Data Reviewed: Basic Metabolic Panel:  Lab 06/07/11 4696 06/06/11 1120  NA 138 138  K 3.9 4.4  CL 105 103  CO2 27 27  GLUCOSE 93 107*  BUN 21 22  CREATININE 1.09 1.21  CALCIUM 8.8 9.2  MG -- --  PHOS -- --   GFR Estimated Creatinine Clearance: 50.4 ml/min (by C-G formula  based on Cr of 1.09). Liver Function Tests:  Lab 06/06/11 1120  AST 20  ALT 12  ALKPHOS 67  BILITOT 0.4  PROT 6.5  ALBUMIN 3.9    CBC:  Lab 06/07/11 0509 06/06/11 1120  WBC 3.9* 5.6  NEUTROABS -- --  HGB 11.0* 11.7*  HCT 33.3* 35.0*  MCV 100.0 99.7  PLT 164 148*   Thyroid function studies  Basename 06/06/11 1930  TSH 0.241*  T4TOTAL --  T3FREE --  THYROIDAB --   Anemia work up  [employer] 06/06/11 1930  VITAMINB12 499  FOLATE --  FERRITIN --  TIBC --  IRON --  RETICCTPCT --    Ref. Range 06/06/2011 19:30  RPR Latest Range: NON REACTIVE  NON REACTIVE   Microbiology No results found for this or any previous visit (from the past 240 hour(s)).     Studies: Dg Chest 2 View 06/06/2011 IMPRESSION: No active disease. Mild elevation of the left hemidiaphragm with left basilar atelectasis or scarring. Original Report Authenticated By: Natasha Mead, M.D.  Ct Head Wo Contrast 06/06/2011 Impression: 1. No acute intracranial abnormality. Stable atrophy and chronic white matter disease.  Ct Cervical Spine Wo Contrast 06/06/2011 IMPRESSION: 1. No acute fracture or subluxation. Diffuse osteopenia. Degenerative changes as described above. Original Report Authenticated By: Natasha Mead, M.D.  Dg Foot Complete Right 06/06/2011 IMPRESSION: No acute fracture or subluxation. Diffuse osteopenia. Plantar spur of the calcaneus. Original Report Authenticated By: Natasha Mead, M.D.   Scheduled Meds:   . cefTRIAXone (ROCEPHIN)  IV  1 g Intravenous Q24H  . ciprofloxacin  500 mg Oral Once  . enoxaparin  40 mg Subcutaneous Q24H  . gabapentin  600 mg Oral QHS  . levothyroxine  100 mcg Oral QAC breakfast  . methadone  10 mg Oral Q8H  . mulitivitamin with minerals  1 tablet Oral Daily  . pantoprazole  40 mg Oral Q1200  . sodium chloride  3 mL Intravenous Q12H  . DISCONTD: gabapentin  600-1,200 mg Oral QHS  . DISCONTD: multivitamins ther. w/minerals  1 tablet Oral Daily   Continuous Infusions:      LOS: 1 day   Hillery Aldo, MD Pager [phone]  06/07/2011, 7:57 AM

## 2011-06-07 NOTE — Progress Notes (Signed)
Met with Pt re: current admission.  Pt reports that he's having marital and financial trouble and that he's depressed.  He admitted to having suicidal thoughts while currently hospitalized but denies any plan or intent.  Pt denies HI, AVH, delusions, paranoia.  Pt reports that he's dreaming more and that his dreams are more vivid and disturbing.  Pt reports cutting self with a thin piece of metal numerous times due to stress.  Pt repeats that he's concerned about his marriage and is worried that his wife is going to leave him.  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  He has worked other jobs, by [employer]    Pt has been married 3x.  His current wife is considerably younger than he is.  Pt cannot remember how long they've been married.  Pt has 3 adult children who live in other states.  Pt and CSW discussed tx options.  Pt amenable to outpt counseling and medication mgmt.  Provided Pt with outpt provider list.  Pt amenable to marriage therapy.  Pt was not interested in [facility].  He stated that this wouldn't be an option due to financial pxs.  Pt gave CSW permission to contact his wife for collateral information.  CSW to continue to follow.  Providence Crosby, LCSWA Clinical Social Work [phone]

## 2011-06-07 NOTE — Progress Notes (Signed)
Patient appears sad today, requesting Chaplain to visit, paged Chaplain and entered spiritual care consult in computer. Updated spouse on status today via phone, patient spoke to his wife via telephone, wife supportive.

## 2011-06-08 DIAGNOSIS — M549 Dorsalgia, unspecified: Secondary | ICD-10-CM | POA: Diagnosis present

## 2011-06-08 DIAGNOSIS — F430 Acute stress reaction: Secondary | ICD-10-CM

## 2011-06-08 DIAGNOSIS — F411 Generalized anxiety disorder: Secondary | ICD-10-CM

## 2011-06-08 DIAGNOSIS — F0391 Unspecified dementia with behavioral disturbance: Secondary | ICD-10-CM

## 2011-06-08 LAB — URINE CULTURE
Colony Count: >=100000
Culture  Setup Time: 201303171959

## 2011-06-08 MED ORDER — METHADONE HCL 5 MG PO TABS
15.0000 mg | ORAL_TABLET | Freq: Three times a day (TID) | ORAL | Status: DC
  Administered 2011-06-08 – 2011-06-11 (×9): 15 mg via ORAL
  Filled 2011-06-08: qty 1
  Filled 2011-06-08: qty 2
  Filled 2011-06-08 (×7): qty 1

## 2011-06-08 MED ORDER — CIPROFLOXACIN HCL 250 MG PO TABS
250.0000 mg | ORAL_TABLET | Freq: Two times a day (BID) | ORAL | Status: DC
  Administered 2011-06-08 – 2011-06-11 (×6): 250 mg via ORAL
  Filled 2011-06-08 (×7): qty 1

## 2011-06-08 NOTE — Consult Note (Addendum)
Reason for Consult:Altered Mental Status; Self-injurious behavior Referring Physician: Dr. Ardyth Man is an 78 y.o. male.  HPI: Pt has been admitted for toxic encephalopathy and history of evolving dementia since October 2012.  He cut his arms with a sheet of metal and created multiple cuts with varying area of ecchymoses.   AXIS I  Acute Stress Reaction, Early Dementia AXIS II Deferred AXIS III Past Medical History  Diagnosis Date  . Dementia   . Hypertension   . Arthritis   . Hypothyroidism   . Chronic back pain   . Anxiety disorder   . Gastroesophageal reflux disease   . Polypharmacy     History of hospitalizations for altered mental status related to medications    Past Surgical History  Procedure Date  . Appendectomy   . Removal of nonrestorable teeth numbers 2, 4, 6, 12, 13, 18, 29   AXIS IV  Marital issues, finances, mental health. Cataracts/vision loss AXIS V GAF 45  Family History  Problem Relation Age of Onset  . Heart disease Father     Social History:  reports that he has never smoked. He has never used smokeless tobacco. He reports that he does not drink alcohol or use illicit drugs.  Allergies: No Known Allergies  Medications: I have reviewed the patient's current medications.  Results for orders placed during the hospital encounter of 06/06/11 (from the past 48 hour(s))  BASIC METABOLIC PANEL     Status: Abnormal   Collection Time   06/07/11  5:09 AM      Component Value Range Comment   Sodium 138  135 - 145 (mEq/L)    Potassium 3.9  3.5 - 5.1 (mEq/L)    Chloride 105  96 - 112 (mEq/L)    CO2 27  19 - 32 (mEq/L)    Glucose, Bld 93  70 - 99 (mg/dL)    BUN 21  6 - 23 (mg/dL)    Creatinine, Ser 1.61  0.50 - 1.35 (mg/dL)    Calcium 8.8  8.4 - 10.5 (mg/dL)    GFR calc non Af Amer 60 (*) >90 (mL/min)    GFR calc Af Amer 69 (*) >90 (mL/min)   CBC     Status: Abnormal   Collection Time   06/07/11  5:09 AM      Component Value Range Comment   WBC  3.9 (*) 4.0 - 10.5 (K/uL)    RBC 3.33 (*) 4.22 - 5.81 (MIL/uL)    Hemoglobin 11.0 (*) 13.0 - 17.0 (g/dL)    HCT 09.6 (*) 04.5 - 52.0 (%)    MCV 100.0  78.0 - 100.0 (fL)    MCH 33.0  26.0 - 34.0 (pg)    MCHC 33.0  30.0 - 36.0 (g/dL)    RDW 40.9  81.1 - 91.4 (%)    Platelets 164  150 - 400 (K/uL)     No results found.  Review of Systems  Unable to perform ROS: other   Blood pressure 146/73, pulse 68, temperature 97.8 F (36.6 C), temperature source Oral, resp. rate 20, height 5' 9.5" (1.765 m), weight 73.3 kg (161 lb 9.6 oz), SpO2 97.00%. Physical Exam  Assessment/Plan:  Chart reviewed.  Discussed with Dr. Darnelle Catalan and with Psych CSW. Review of chart notes and serial interviews with patient reveal great disparity of history.  A follow-up interview with patient after 06/06/11 was conducted 06/07/11 in the afternoon.  At that time he spoke freely of his self-injurious behavior  as a reaction to fear he was going to lose his wife.  He was alert, labile in affect and often would hesitate to speak for tearful pauses.  He says they have problems because his wife [and wife's family] drink every day.  This is his third marriage and says they have been married many years.  He is doubly distress because she called and said she would visit and then called to say she was not coming. He was hopeful but not trusting she would come today.  He offered nothing concerning his behavior except he thought he might consider a suicide attempt if she were to leave him.  Today his wife came but was not able to speak with her.  Discussed with CSW A. Simpson who reviewed wife's concerns about his problematic behavior and confusion and possible financial assistance.  It is observed that he has repeatedly began his comments with "neither one of [place] has ever committed adultery" and is of interest that Dr. York Ram note states he said he cut his arms 'over a woman' [not specifying his wife].  The specifics of his dementia were not  elaborated upon nor admitted during the two interviews with pt.  It is critical to discuss pt's behavior at home.  In the hospital environment, he is very guarded.  He does admit to no longer driving.  He denies he forgets things.  When initiating the more specific questions to evaluate mental capacity, this patient shut down and refused to speak.  This implies, or suggests in the context of his wife's report, that he fears revealing his diminished mental capacity with respect to memory. Today, he focuses on thoughts of worst financial situations and that his wife stopped working.  He is very guarded saying he thinks things will work out but declines to specifically say what things needed to be worked out.  RECOMMENDATION: 1.  Pt initiated self-injurious behavior over acute distress 2. History given by wife strongly supports early dementia with 'sundowning' symptoms exacerbated at night. 3. Pt has become a danger to self with self-injurious behavior and his history of wandering out of home; concern he may become lost due to poor memory.  4. Given these symptoms and behavior may be substantiated in am, he may benefit from Exelon patch 4.6 mg daily 5. Depending on wife's ability to care for pt., he may need transfer to a geriatric inpatient facility.  6. Would not give antipsychotics to this pt with the increased QTc interval.  7. Will re-evaluate 06/18/11 in conjunction with effort to speak with wife.   Tayva Easterday 06/08/2011, 7:39 PM

## 2011-06-08 NOTE — Progress Notes (Signed)
Pt was able to talk with spouse on cordless phone.

## 2011-06-08 NOTE — Progress Notes (Addendum)
Initial visit with pt in response to spiritual care consult.   Pt lying in bed - sitter present in room.  Pt was hospitable and talkative.  Conversation with pt was somewhat tangential. Pt initially confused about chaplain's identity and his location in hospital, asking if chaplain [place].  Pt was able to orient toward things present in room - his meal.  As Pt was clear about chaplain identity, expressed some stresses that he wanted help with.  Pt expressed stresses around marriage, financial stresses and uncertainty about his wife being able to call him.  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  Pt reported that he had not been able to sleep well.   Chaplain provided emotional and spiritual support and engaged pt on techniques for relaxing in the midst of stress.  Chaplain prayed with pt.   Pt requested chaplain follow up tomorrow.  Chaplain will continue to follow during admission.  Thanks you for consult.  Please page as needs arise.    Belva Crome, [place]   06/08/11 1700  Clinical Encounter Type  Visited With Patient;Health care provider  Visit Type Initial;Psychological support;Spiritual support;Social support  Referral From Nurse  Consult/Referral To Chaplain  Recommendations follow up for continued support  Spiritual Encounters  Spiritual Needs Emotional;Prayer  Stress Factors  Patient Stress Factors Lack of knowledge;Health changes;Family relationships;Major life changes

## 2011-06-08 NOTE — Progress Notes (Signed)
PROGRESS NOTE  [name]:[id number] DOB: 1924/04/25 DOA: 06/06/2011 PCP: Kimber Relic, MD, MD  Brief narrative: [name] is a 78 year old man with a past medical history of dementia who was admitted on 06/06/2011 with suicidal ideation, self mutilation, and toxic encephalopathy from a urinary tract infection. He is currently being maintained with a Recruitment consultant and is status post  formal psychiatry evaluation, with final recommendations pending.  Assessment/Plan: Principal Problem:  *Toxic encephalopathy  The patient's altered mental status appears to be due to toxic encephalopathy from a urinary tract infection in the setting of dementia with behavioral disturbance. We are treating his urinary tract infection with IV Rocephin and will followup on urine cultures, sent on admission. Given his concurrent suicidal ideation, we requested a psychiatry evaluation, which has been done but the formal write up is pending.  Spoke with Dr. Ferol Luz about the evaluation, who confirms that he has cognitive impairments.  He will likely need psychiatric placement, but Dr. Ferol Luz wants to speak with his wife before she makes a formal recommendation. We also ordered a reversible causes of dementia workup including a check of his TSH which was slightly low, RPR which was nonreactive and B12, which was within normal limits.  Active Problems:  E. Coli Urinary tract infection  Urine cultures were sent. The patient was given one dose of Cipro in the emergency department, then placed on daily IV Rocephin.  E. Coli growing in urine, Cipro sensitive, so will d/c Rocephin and resume Cipro.  Dementia with behavioral disturbance / Suicidal ideation  Given his recent self-mutilation, we ordered a psychiatry consult. He remains on suicide precautions. He has been seen and evaluated by Dr. Ferol Luz, who will likely recommend [facility] geriatric psychiatric unit placement, but she wants to speak with the patient's wife  further before she makes a formal recommendation. Hypertension  The patient is not on any antihypertensives at baseline and his blood pressure is currently normal. He did have one high reading overnight. We'll continue to monitor.  Hypothyroidism  TSH slightly low on his current dose of Synthroid, check free T4. Gastroesophageal reflux disease  We'll continue the patient's PPI therapy.  Anxiety disorder  We'll continue when necessary Ativan, but only give 1 mg every 8 hours as needed. If he becomes agitated, would prefer to use an antipsychotic medication.  Polypharmacy / Chronic back pain. Patient's current medications have been reviewed. We lowered his dose of Ativan, and continued his home dose of methadone at 10 mg every 8 hours. We ordered Toradol for breakthrough pain, but he continues to report 8/10 pain.  Will increase his methadone to 15 mg TID. Wounds, multiple  Patient's upper extremity wounds appear to be superficial and not infected. We'll apply Neosporin but leave open to air.   Code Status: Full code Family Communication: [place], [place] Spouse [phone]; Called and left message 06/07/11. Disposition Plan: Home anticipated but will await formal psychiatry evaluation.  Consultants:  Dr. Mickeal Skinner, Psychiatry   Procedures:  12-lead EKG 06/06/2011: SINUS RHYTHM ~ normal P axis, V-rate 50- 99   Antibiotics:  Cipro 06/06/2011----> 06/06/2011--->Resumed 06/08/11  Rocephin 06/06/2011--->06/08/11   Subjective  [name] complains of 8/10 spine pain, not relieved by current medicines.  He denies ongoing dysuria.     Objective    Interim History:  [name] was stable overnight.  Objective: Filed Vitals:   06/07/11 1431 06/07/11 2231 06/08/11 0602 06/08/11 1344  BP: 122/59 145/67 146/77 146/73  Pulse: 67 67 62  68  Temp: 98.2 F (36.8 C) 98.8 F (37.1 C) 97.5 F (36.4 C) 97.8 F (36.6 C)  TempSrc: Oral Oral Oral Oral  Resp: 20 18 18 20   Height:        Weight:      SpO2: 95% 95% 96% 97%    Intake/Output Summary (Last 24 hours) at 06/08/11 1551 Last data filed at 06/08/11 1400  Gross per 24 hour  Intake    970 ml  Output      0 ml  Net    970 ml    Exam: Gen:  NAD Cardiovascular:  RRR, No M/R/G Respiratory: Lungs CTAB Gastrointestinal: Abdomen soft, NT/ND with normal active bowel sounds. Extremities: No C/E/C Psychiatry: Mood and affect stable    Data Reviewed: Basic Metabolic Panel:  Lab 06/07/11 1308 06/06/11 1120  NA 138 138  K 3.9 4.4  CL 105 103  CO2 27 27  GLUCOSE 93 107*  BUN 21 22  CREATININE 1.09 1.21  CALCIUM 8.8 9.2  MG -- --  PHOS -- --   GFR Estimated Creatinine Clearance: 50.4 ml/min (by C-G formula based on Cr of 1.09). Liver Function Tests:  Lab 06/06/11 1120  AST 20  ALT 12  ALKPHOS 67  BILITOT 0.4  PROT 6.5  ALBUMIN 3.9    CBC:  Lab 06/07/11 0509 06/06/11 1120  WBC 3.9* 5.6  NEUTROABS -- --  HGB 11.0* 11.7*  HCT 33.3* 35.0*  MCV 100.0 99.7  PLT 164 148*   Thyroid function studies  Basename 06/06/11 1930  TSH 0.241*  T4TOTAL --  T3FREE --  THYROIDAB --   Anemia work up  [employer] 06/06/11 1930  VITAMINB12 499  FOLATE --  FERRITIN --  TIBC --  IRON --  RETICCTPCT --    Ref. Range 06/06/2011 19:30  RPR Latest Range: NON REACTIVE  NON REACTIVE   Microbiology Recent Results (from the past 240 hour(s))  URINE CULTURE     Status: Normal   Collection Time   06/06/11 11:46 AM      Component Value Range Status Comment   Specimen Description URINE, CLEAN CATCH   Final    Special Requests NONE   Final    Culture  Setup Time [id number]   Final    Colony Count >=100,000 COLONIES/ML   Final    Culture ESCHERICHIA COLI   Final    Report Status 06/08/2011 FINAL   Final    Organism ID, Bacteria ESCHERICHIA COLI   Final        Studies: Dg Chest 2 View 06/06/2011 IMPRESSION: No active disease. Mild elevation of the left hemidiaphragm with left basilar atelectasis or  scarring. Original Report Authenticated By: Natasha Mead, M.D.  Ct Head Wo Contrast 06/06/2011 Impression: 1. No acute intracranial abnormality. Stable atrophy and chronic white matter disease.  Ct Cervical Spine Wo Contrast 06/06/2011 IMPRESSION: 1. No acute fracture or subluxation. Diffuse osteopenia. Degenerative changes as described above. Original Report Authenticated By: Natasha Mead, M.D.  Dg Foot Complete Right 06/06/2011 IMPRESSION: No acute fracture or subluxation. Diffuse osteopenia. Plantar spur of the calcaneus. Original Report Authenticated By: Natasha Mead, M.D.   Scheduled Meds:    . cefTRIAXone (ROCEPHIN)  IV  1 g Intravenous Q24H  . enoxaparin  40 mg Subcutaneous Q24H  . gabapentin  600 mg Oral QHS  . levothyroxine  100 mcg Oral QAC breakfast  . methadone  10 mg Oral Q8H  . mulitivitamin with minerals  1 tablet Oral Daily  .  pantoprazole  40 mg Oral Q1200  . sodium chloride  3 mL Intravenous Q12H   Continuous Infusions:     LOS: 2 days   Hillery Aldo, MD Pager [phone]  06/08/2011, 3:51 PM

## 2011-06-08 NOTE — Progress Notes (Signed)
Spoke with Pt's wife, Mrs. Roland, re: Pt's current admission.  Mrs. Polyak reported that Pt has been having pxs with confusion, hallucinations and delusions since October.  She stated that she had to quit her job as and Charity fundraiser at [employer] to care for [employer], as Pt, she feels, Pt is a danger to himself.   Mrs. Iiams states that Pt rarely sleeps and that, each evening, he becomes "mean, angry and "unruly."  Mrs. Rozelle states that Pt doesn't sleep at night and that he wanders the house.  She stated that she gets very little sleep due to having to monitor Pt.    Mrs. Lebeau states that she has to keep Pt locked in the house because he will wander off or want to drive; he no longer has a license.  Pt thinks that he's a locked-down facility and will see people from his past.  Additionally, Pt has trouble remembering his wife and often thinks that she's her sister; Pt has only met his wife's sister 2x.  CSW and Mrs. Hechler agreed to meet in Pt's room today at 11:30 to discuss placement options.  CSW to continue to follow.  Providence Crosby, LCSWA Clinical Social Work [phone]

## 2011-06-08 NOTE — Progress Notes (Addendum)
Met with Pt, his wife and his brother-in-law.  Pt's wife reports that she can no longer handle Pt's behaviors and that she feels that he needs a higher level of care than what she can provide.   Pt's wife is interested in [facility] but is concerned about the cost.  She states that they are struggling financially and that they cannot afford out-of-pocket costs.  CSW to contact [facility] to discuss payment.  CSW left message for Jasmine December, Artist, re: Pt's eligibility for [facility] and assistance with his current hospital stay.  CSW thanked Mrs. Daman and her brother, Rocky Link, for their time.  CSW to follow up with them on Ken's phone, [phone].  Spoke with [facility].  Informed that they accept [facility] and that they are billed as a regular hospital, with the Pt responsible for 20% of the total bill.  Informed that they have assistance through the billing office for those with little to no income.  Spoke with Jasmine December, Artist.  Jasmine December suggested that the family call the [facility] office through DSS directly, at [phone], to inquire about M'ca[facility] eligibility.    Spoke with Rocky Link.  Relayed the above information re: [facility] and M[facility].  Rocky Link will relay information to Mrs. Thorner and they will call [facility] now.    Family feeling that Pt's needs inpt tx but is concerned that Pt will not want to go.  CSW to discuss this with psych to determine if a capacity evaluation is in order.  CSW to continue to follow.  Providence Crosby, LCSWA Clinical Social Work [phone]

## 2011-06-09 LAB — BASIC METABOLIC PANEL
CO2: 30 mEq/L (ref 19–32)
Calcium: 9 mg/dL (ref 8.4–10.5)
Glucose, Bld: 87 mg/dL (ref 70–99)
Sodium: 140 mEq/L (ref 135–145)

## 2011-06-09 LAB — CBC
HCT: 37.5 % — ABNORMAL LOW (ref 39.0–52.0)
MCH: 33.2 pg (ref 26.0–34.0)
MCV: 100.5 fL — ABNORMAL HIGH (ref 78.0–100.0)
Platelets: 172 10*3/uL (ref 150–400)
RBC: 3.73 MIL/uL — ABNORMAL LOW (ref 4.22–5.81)

## 2011-06-09 NOTE — Progress Notes (Signed)
Patient ID: George Foster, male   DOB: Feb 18, 1925, 78 y.o.   MRN: [id number]  Assessment/Plan:   Principal Problem:   *Toxic encephalopathy  - appears to be clinically improving - psych consult pending - placement pending  Active Problems:   E. Coli Urinary tract infection  - continue Cipro  Dementia with behavioral disturbance / Suicidal ideation  - sitter at bedside - placement pending  Hypertension  - stable - continue to monitor the vitals per floor protocol  Hypothyroidism  - TSH slightly low on his current dose of Synthroid - readjust the dose  Gastroesophageal reflux disease  - We'll continue the patient's PPI therapy.   Polypharmacy / Chronic back pain.  - pain controlled on current medication regimen  Wounds, multiple  - Patient's upper extremity wounds appear to be superficial and not infected. We'll apply Neosporin but leave open to air.   EDUCATION - test results and diagnostic studies were discussed with patient  - patient verbalized the understanding - questions were answered at the bedside and contact information was provided for additional questions or concerns  Subjective: No events overnight. Patient denies chest pain, shortness of breath, abdominal pain.   Objective:  Vital signs in last 24 hours:  Filed Vitals:   06/08/11 0602 06/08/11 1344 06/08/11 2113 06/09/11 0547  BP: 146/77 146/73 142/75 104/75  Pulse: 62 68 57 63  Temp: 97.5 F (36.4 C) 97.8 F (36.6 C) 98 F (36.7 C) 97.7 F (36.5 C)  TempSrc: Oral Oral Oral Oral  Resp: 18 20 16 18   SpO2: 96% 97% 97% 96%    Intake/Output from previous day:  Intake/Output Summary (Last 24 hours) at 06/09/11 1347 Last data filed at 06/09/11 0830  Gross per 24 hour  Intake    380 ml  Output     50 ml  Net    330 ml    Physical Exam: General: Alert, awake, in no acute distress. HEENT: No bruits, no goiter. Moist mucous membranes, no scleral icterus, no conjunctival pallor. Heart:  Regular rate and rhythm, S1/S2 +, no murmurs, rubs, gallops. Lungs: Clear to auscultation bilaterally. No wheezing, no rhonchi, no rales.  Abdomen: Soft, nontender, nondistended, positive bowel sounds. Extremities: No clubbing or cyanosis, no pitting edema,  positive pedal pulses. Neuro: Grossly nonfocal.  Lab Results:  Lab 06/09/11 0535 06/07/11 0509 06/06/11 1120  WBC 5.3 3.9* 5.6  HGB 12.4* 11.0* 11.7*  HCT 37.5* 33.3* 35.0*  PLT 172 164 148*  MCV 100.5* 100.0 99.7    Lab 06/09/11 0535 06/07/11 0509 06/06/11 1120  NA 140 138 138  K 4.4 3.9 4.4  CL 105 105 103  CO2 30 27 27   GLUCOSE 87 93 107*  BUN 28* 21 22  CREATININE 1.19 1.09 1.21  CALCIUM 9.0 8.8 9.2    URINE CULTURE     Status: Normal   06/06/11 11:46 AM      Component Value Status   Specimen Description URINE  Final   Colony Count >=100,000 COLONIES/ML  Final   Report Status 06/08/2011 FINAL  Final   Organism ID, Bacteria ESCHERICHIA COLI  Final   Medications: Scheduled Meds:   . ciprofloxacin  250 mg Oral BID  . enoxaparin  40 mg Subcutaneous Q24H  . gabapentin  600 mg Oral QHS  . levothyroxine  100 mcg Oral QAC breakfast  . methadone  15 mg Oral Q8H  . mulitivitamin   1 tablet Oral Daily  . pantoprazole  40 mg Oral Q1200  .  sodium chloride  3 mL Intravenous Q12H   Continuous Infusions:  PRN Meds:.sodium chloride, acetaminophen, acetaminophen, bisacodyl, ketorolac, LORazepam, ondansetron (ZOFRAN) IV, ondansetron, sodium chloride, temazepam     LOS: 3 days   MAGICK-Tao Satz 06/09/2011, 1:47 PM  [facility] Pager: [phone]
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
## 2011-06-09 NOTE — Progress Notes (Signed)
Psych MD requesting a family meeting.  Contacted family.  Meeting scheduled for today at 1700.  Psych MD notified.  CSW to continue to follow.  Providence Crosby, LCSWA Clinical Social Work [phone]

## 2011-06-10 MED ORDER — CLONAZEPAM 1 MG PO TABS
1.0000 mg | ORAL_TABLET | Freq: Three times a day (TID) | ORAL | Status: DC | PRN
  Administered 2011-06-10: 1 mg via ORAL
  Filled 2011-06-10: qty 1

## 2011-06-10 MED ORDER — MEMANTINE HCL 10 MG PO TABS
10.0000 mg | ORAL_TABLET | Freq: Two times a day (BID) | ORAL | Status: DC
  Administered 2011-06-10 – 2011-06-11 (×2): 10 mg via ORAL
  Filled 2011-06-10 (×3): qty 1

## 2011-06-10 MED ORDER — RIVASTIGMINE 4.6 MG/24HR TD PT24
4.6000 mg | MEDICATED_PATCH | Freq: Every day | TRANSDERMAL | Status: DC
  Administered 2011-06-10 – 2011-06-11 (×2): 4.6 mg via TRANSDERMAL
  Filled 2011-06-10 (×2): qty 1

## 2011-06-10 MED ORDER — DIVALPROEX SODIUM 500 MG PO DR TAB
500.0000 mg | DELAYED_RELEASE_TABLET | Freq: Two times a day (BID) | ORAL | Status: DC
  Administered 2011-06-10 – 2011-06-11 (×3): 500 mg via ORAL
  Filled 2011-06-10 (×4): qty 1
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
## 2011-06-10 NOTE — Progress Notes (Signed)
Patient ID: George Foster, male   DOB: February 01, 1925, 78 y.o.   MRN: [id number]  Subjective: No events overnight. Patient denies chest pain, shortness of breath, abdominal pain.   Objective:  Vital signs in last 24 hours:  Filed Vitals:   06/09/11 1351 06/09/11 2126 06/10/11 0531 06/10/11 1349  BP: 150/75 136/71 138/65 125/63  Pulse: 74 83 66 75  Temp: 97.5 F (36.4 C) 98.8 F (37.1 C) 99 F (37.2 C) 97.9 F (36.6 C)  TempSrc: Oral Oral Oral Oral  Resp: 18 16 18 17   Height:      Weight:      SpO2: 96% 94% 96% 95%    Intake/Output from previous day:   Intake/Output Summary (Last 24 hours) at 06/10/11 1928 Last data filed at 06/10/11 1658  Gross per 24 hour  Intake    720 ml  Output      0 ml  Net    720 ml    Physical Exam: General: Alert, awake, oriented to name and year, in no acute distress. HEENT: No bruits, no goiter. Moist mucous membranes, no scleral icterus, no conjunctival pallor. Heart: Regular rate and rhythm, S1/S2 +, no murmurs, rubs, gallops. Lungs: Clear to auscultation bilaterally. No wheezing, no rhonchi, no rales.  Abdomen: Soft, nontender, nondistended, positive bowel sounds. Extremities: No clubbing or cyanosis, no pitting edema,  positive pedal pulses. Neuro: Grossly nonfocal.  Lab Results:  Basic Metabolic Panel:    Component Value Date/Time   NA 140 06/09/2011 0535   K 4.4 06/09/2011 0535   CL 105 06/09/2011 0535   CO2 30 06/09/2011 0535   BUN 28* 06/09/2011 0535   CREATININE 1.19 06/09/2011 0535   GLUCOSE 87 06/09/2011 0535   CALCIUM 9.0 06/09/2011 0535   CBC:    Component Value Date/Time   WBC 5.3 06/09/2011 0535   HGB 12.4* 06/09/2011 0535   HCT 37.5* 06/09/2011 0535   PLT 172 06/09/2011 0535   MCV 100.5* 06/09/2011 0535   NEUTROABS 2.8 01/21/2011 1655   LYMPHSABS 1.0 01/21/2011 1655   MONOABS 0.4 01/21/2011 1655   EOSABS 0.2 01/21/2011 1655   BASOSABS 0.0 01/21/2011 1655      Lab 06/09/11 0535 06/07/11 0509 06/06/11 1120  WBC 5.3 3.9*  5.6  HGB 12.4* 11.0* 11.7*  HCT 37.5* 33.3* 35.0*  PLT 172 164 148*  MCV 100.5* 100.0 99.7  MCH 33.2 33.0 33.3  MCHC 33.1 33.0 33.4  RDW 14.0 14.1 14.2  LYMPHSABS -- -- --  MONOABS -- -- --  EOSABS -- -- --  BASOSABS -- -- --  BANDABS -- -- --    Lab 06/09/11 0535 06/07/11 0509 06/06/11 1120  NA 140 138 138  K 4.4 3.9 4.4  CL 105 105 103  CO2 30 27 27   GLUCOSE 87 93 107*  BUN 28* 21 22  CREATININE 1.19 1.09 1.21  CALCIUM 9.0 8.8 9.2  MG -- -- --    Recent Results (from the past 240 hour(s))  URINE CULTURE     Status: Normal   Collection Time   06/06/11 11:46 AM      Component Value Range Status Comment   Specimen Description URINE, CLEAN CATCH   Final    Special Requests NONE   Final    Culture  Setup Time [id number]   Final    Colony Count >=100,000 COLONIES/ML   Final    Culture ESCHERICHIA COLI   Final    Report Status 06/08/2011 FINAL   Final  Organism ID, Bacteria ESCHERICHIA COLI   Final     Studies/Results: No results found.  Medications: Scheduled Meds:   . ciprofloxacin  250 mg Oral BID  . divalproex  500 mg Oral Q12H  . enoxaparin  40 mg Subcutaneous Q24H  . gabapentin  600 mg Oral QHS  . levothyroxine  100 mcg Oral QAC breakfast  . methadone  15 mg Oral Q8H  . mulitivitamin with minerals  1 tablet Oral Daily  . pantoprazole  40 mg Oral Q1200  . sodium chloride  3 mL Intravenous Q12H   Continuous Infusions:  PRN Meds:.sodium chloride, acetaminophen, acetaminophen, bisacodyl, clonazePAM, ketorolac, LORazepam, ondansetron (ZOFRAN) IV, ondansetron, sodium chloride, temazepam  Assessment/Plan:  Principal Problem:  *Toxic encephalopathy  - appears to be clinically improving  - psych consult appreciated, continue Depakote, Klonopin - placement pending   Active Problems:  E. Coli Urinary tract infection  - continue Cipro   Dementia with behavioral disturbance / Suicidal ideation  - sitter at bedside  - placement pending   Hypertension    - stable  - continue to monitor the vitals per floor protocol   Hypothyroidism  - TSH slightly low on his current dose of Synthroid  - readjust the dose   Gastroesophageal reflux disease  - We'll continue the patient's PPI therapy.   Polypharmacy / Chronic back pain.  - pain controlled on current medication regimen   Wounds, multiple  - Patient's upper extremity wounds appear to be superficial and not infected. We'll apply Neosporin but leave open to air.   EDUCATION  - test results and diagnostic studies were discussed with patient and wife over the phone - patient verbalized the understanding  - questions were answered at the bedside and contact information was provided for additional questions or concerns    LOS: 4 days   MAGICK-Rommel Hogston 06/10/2011, 7:28 PM  [facility] Pager: [phone]

## 2011-06-10 NOTE — Progress Notes (Signed)
Spoke with Pt's wife who was confused about what she wants for Pt.  Initially, she stated that Pt needed PT/OT rehab.  After speaking further with Pt's wife, she stated that Pt isn't in need of physical therapy rehab, he needs "mental health rehab."  Explained to Pt's wife that the option for [facility] is available to her and reminded her that she declined this several days ago.  Pt's wife stated that she will contact her insurance company to see what the coverage is for [employer].  CSW to await a call from Mrs. Cullers.  Providence Crosby, LCSWA Clinical Social Work [phone]

## 2011-06-10 NOTE — Progress Notes (Addendum)
Message from Pt's wife stating that she'd like for CSW to proceed with referring Pt to [facility].    Contacted Pt's wife and spoke with Rocky Link, Pt's brother-in-law. Explained the referral process to Rocky Link and stated that [facility] has to deem Pt appropriate for their facility.  Rocky Link verbalized an understanding and stated that he will relay this information to Pt's wife.  Conferred with psych MD.  Psych MD is ok with this plan.  Contacted [facility] and spoke with [place].  Misty Stanley requesting CMP, CBC, Chest Xray, EKG, UA, psych notes, recent progress notes and med list.  Faxed above mentioned information to [facility].  CSW to continue to follow.  Providence Crosby, LCSWA Clinical Social Work [phone]

## 2011-06-11 MED ORDER — FLEET ENEMA 7-19 GM/118ML RE ENEM
1.0000 | ENEMA | Freq: Once | RECTAL | Status: AC
  Administered 2011-06-11: 1 via RECTAL
  Filled 2011-06-11: qty 1

## 2011-06-11 MED ORDER — RIVASTIGMINE 4.6 MG/24HR TD PT24: 1.0000 | MEDICATED_PATCH | Freq: Every day | TRANSDERMAL | Status: DC

## 2011-06-11 MED ORDER — DIVALPROEX SODIUM 500 MG PO DR TAB: 500.0000 mg | DELAYED_RELEASE_TABLET | Freq: Two times a day (BID) | ORAL | Status: DC

## 2011-06-11 MED ORDER — RIVASTIGMINE 4.6 MG/24HR TD PT24: 30.0000 | MEDICATED_PATCH | Freq: Every day | TRANSDERMAL | Status: DC

## 2011-06-11 MED ORDER — CLONAZEPAM 1 MG PO TABS: 1.0000 mg | ORAL_TABLET | Freq: Three times a day (TID) | ORAL | Status: AC | PRN

## 2011-06-11 MED ORDER — POLYETHYLENE GLYCOL 3350 17 G PO PACK: 17.0000 g | PACK | Freq: Every day | ORAL | Status: AC

## 2011-06-11 MED ORDER — MEMANTINE HCL 10 MG PO TABS: 10.0000 mg | ORAL_TABLET | Freq: Two times a day (BID) | ORAL | Status: AC

## 2011-06-11 MED ORDER — POLYETHYLENE GLYCOL 3350 17 G PO PACK
17.0000 g | PACK | Freq: Every day | ORAL | Status: DC
  Administered 2011-06-11: 17 g via ORAL
  Filled 2011-06-11: qty 1

## 2011-06-11 MED ORDER — BISACODYL 5 MG PO TBEC
5.0000 mg | DELAYED_RELEASE_TABLET | Freq: Two times a day (BID) | ORAL | Status: DC
  Filled 2011-06-11: qty 1

## 2011-06-11 MED ORDER — OXYCODONE-ACETAMINOPHEN 5-325 MG PO TABS
1.0000 | ORAL_TABLET | ORAL | Status: DC | PRN
  Administered 2011-06-11: 1 via ORAL
  Filled 2011-06-11: qty 1

## 2011-06-11 NOTE — Discharge Instructions (Signed)
Altered Mental Status Altered mental status most often refers to an abnormal change in your responsiveness and awareness. It can affect your speech, thought, mobility, memory, attention span, or alertness. It can range from slight confusion to complete unresponsiveness (coma). Altered mental status can be a sign of a serious underlying medical condition. Rapid evaluation and medical treatment is necessary for patients having an altered mental status. CAUSES   Low blood sugar (hypoglycemia) or diabetes.   Severe loss of body fluids (dehydration) or a body salt (electrolyte) imbalance.   A stroke or other neurologic problem, such as dementia or delirium.   A head injury or tumor.   A drug or alcohol overdose.   Exposure to toxins or poisons.   Depression, anxiety, and stress.   A low oxygen level (hypoxia).   An infection.   Blood loss.   Twitching or shaking (seizure).   Heart problems, such as heart attack or heart rhythm problems (arrhythmias).   A body temperature that is too low or too high (hypothermia or hyperthermia).  DIAGNOSIS  A diagnosis is based on your history, symptoms, physical and neurologic examinations, and diagnostic tests. Diagnostic tests may include:  Measurement of your blood pressure, pulse, breathing, and oxygen levels (vital signs).   Blood tests.   Urine tests.   X-ray exams.   A computerized magnetic scan (magnetic resonance imaging, MRI).   A computerized X-ray scan (computed tomography, CT scan).  TREATMENT  Treatment will depend on the cause. Treatment may include:  Management of an underlying medical or mental health condition.   Critical care or support in the hospital.  HOME CARE INSTRUCTIONS   Only take over-the-counter or prescription medicines for pain, discomfort, or fever as directed by your caregiver.   Manage underlying conditions as directed by your caregiver.   Eat a healthy, well-balanced diet to maintain strength.    Join a support group or prevention program to cope with the condition or trauma that caused the altered mental status. Ask your caregiver to help choose a program that works for you.   Follow up with your caregiver for further examination, therapy, or testing as directed.  SEEK MEDICAL CARE IF:   You feel unwell or have chills.   You or your family notice a change in your behavior or your alertness.   You have trouble following your caregiver's treatment plan.   You have questions or concerns.  SEEK IMMEDIATE MEDICAL CARE IF:   You have a rapid heartbeat or have chest pain.   You have difficulty breathing.   You have a fever.   You have a headache with a stiff neck.   You cough up blood.   You have blood in your urine or stool.   You have severe agitation or confusion.  MAKE SURE YOU:   Understand these instructions.   Will watch your condition.   Will get help right away if you are not doing well or get worse.  Document Released: 08/26/2009 Document Revised: 02/25/2011 Document Reviewed: 08/26/2009 ExitCare Patient Information 2012 [employer]. 

## 2011-06-11 NOTE — Evaluation (Signed)
Physical Therapy Evaluation Patient Details Name: George Foster MRN: [id number] DOB: 02/05/1925 Today's Date: 06/11/2011  Problem List:  Patient Active Problem List  Diagnoses  . Toxic encephalopathy  . E. coli urinary tract infection  . Dementia with behavioral disturbance  . Hypertension  . Hypothyroidism  . Gastroesophageal reflux disease  . Anxiety disorder  . Polypharmacy  . Wounds, multiple  . Suicidal ideation  . Chronic back pain    Past Medical History:  Past Medical History  Diagnosis Date  . Dementia   . Hypertension   . Arthritis   . Hypothyroidism   . Chronic back pain   . Anxiety disorder   . Gastroesophageal reflux disease   . Polypharmacy     History of hospitalizations for altered mental status related to medications   Past Surgical History:  Past Surgical History  Procedure Date  . Appendectomy   . Removal of nonrestorable teeth numbers 2, 4, 6, 12, 13, 18, 29     PT Assessment/Plan/Recommendation PT Assessment Clinical Impression Statement: 78 y.o. male admitted with toxic encephalopathy, UTI, self mutilation, recent fall, suicidal thoughts. Pt would benefit from acute PT to maximize safety and independence with mobility and facilitate DC to appropriate venue.  PT Recommendation/Assessment: Patient will need skilled PT in the acute care venue PT Problem List: Pain;Decreased cognition;Decreased safety awareness Barriers to Discharge: None PT Therapy Diagnosis : Generalized weakness PT Plan PT Frequency: Min 3X/week PT Treatment/Interventions: DME instruction;Gait training;Functional mobility training;Therapeutic exercise;Cognitive remediation;Patient/family education PT Recommendation Follow Up Recommendations: Supervision/Assistance - 24 hour (per chart DC plan is for geri/psych facility) Equipment Recommended: None recommended by PT PT Goals  Acute Rehab PT Goals PT Goal Formulation: Patient unable to participate in goal setting Time For  Goal Achievement: 2 weeks Pt will Ambulate: >150 feet;with supervision;with rolling walker PT Goal: Ambulate - Progress: Goal set today Pt will Perform Home Exercise Program: with supervision, verbal cues required/provided PT Goal: Perform Home Exercise Program - Progress: Goal set today  PT Evaluation Precautions/Restrictions  Precautions Precautions: Fall (suicide) Required Braces or Orthoses: No Restrictions Weight Bearing Restrictions: No Prior Functioning  Home Living Lives With: Family (brother and sister in law per pt) Receives Help From: Family Type of Home: House Home Layout: One level Home Access: Level entry Home Adaptive Equipment: Shower chair without back;Walker - four wheeled Prior Function Level of Independence: Requires assistive device for independence;Needs assistance with ADLs Driving: No Vocation: Retired Designer, television/film set Level: Oriented to person;Disoriented to place;Disoriented to situation Sensation/Coordination Sensation Light Touch: Appears Intact Coordination Gross Motor Movements are Fluid and Coordinated: Yes Fine Motor Movements are Fluid and Coordinated: Yes Extremity Assessment RUE Assessment RUE Assessment: Within Functional Limits LUE Assessment LUE Assessment: Within Functional Limits RLE Assessment RLE Assessment: Within Functional Limits LLE Assessment LLE Assessment: Within Functional Limits Mobility (including Balance) Bed Mobility Bed Mobility: Yes Supine to Sit: 5: Supervision;With rails;HOB elevated (Comment degrees) (HOB 40*) Transfers Transfers: Yes Sit to Stand: 4: Min assist;5: Supervision;From bed;With upper extremity assist Sit to Stand Details (indicate cue type and reason): min/guard for safety 2* confusion Stand to Sit: 5: Supervision;4: Min assist;To chair/3-in-1;With armrests Stand to Sit Details: min/guard for safety 2* confusion Ambulation/Gait Ambulation/Gait: Yes Ambulation/Gait Assistance:  4: Min assist Ambulation/Gait Assistance Details (indicate cue type and reason): min A to steer RW, pt veers to R Ambulation Distance (Feet): 150 Feet Assistive device: Rolling walker Gait Pattern: Within Functional Limits    Exercise    End of Session PT -  End of Session Equipment Utilized During Treatment: Gait belt Activity Tolerance: Patient limited by pain;Patient limited by fatigue Patient left: in chair;with call bell in reach (sitter present) Nurse Communication: Mobility status for transfers;Mobility status for ambulation General Behavior During Session: [facility] for tasks performed Cognition: Impaired  Tamala Ser 06/11/2011, 10:51 AM [phone]

## 2011-06-11 NOTE — Progress Notes (Signed)
Pt, with wife present, said that he is willing to go to [facility].    Notified [facility].    Lanora Manis, RN at [facility], stating that she is awaiting her MD to review Pt's notes and that she will contact CSW with a disposition.  CSW to continue to follow.  Providence Crosby, LCSWA Clinical Social Work [phone]
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
## 2011-06-11 NOTE — Progress Notes (Signed)
Patient ID: George Foster, male   DOB: 27-Oct-1924, 78 y.o.   MRN: [id number]  Subjective: No events overnight. Patient denies chest pain, shortness of breath, abdominal pain.   Objective:  Vital signs in last 24 hours:  Filed Vitals:   06/10/11 0531 06/10/11 1349 06/10/11 2156 06/11/11 0616  BP: 138/65 125/63 123/66 121/59  Pulse: 66 75 67 59  Temp: 99 F (37.2 C) 97.9 F (36.6 C) 98.6 F (37 C) 97.7 F (36.5 C)  TempSrc: Oral Oral Oral Oral  Resp: 18 17 16 16   Height:      Weight:      SpO2: 96% 95% 96% 97%    Intake/Output from previous day:   Intake/Output Summary (Last 24 hours) at 06/11/11 1000 Last data filed at 06/11/11 0900  Gross per 24 hour  Intake    720 ml  Output      0 ml  Net    720 ml    Physical Exam: General: Alert, awake, oriented to name only, in no acute distress. HEENT: No bruits, no goiter. Moist mucous membranes, no scleral icterus, no conjunctival pallor. Heart: Regular rate and rhythm, S1/S2 +, no murmurs, rubs, gallops. Lungs: Clear to auscultation bilaterally. No wheezing, no rhonchi, no rales.  Abdomen: Soft, nontender, nondistended, positive bowel sounds. Extremities: No clubbing or cyanosis, no pitting edema,  positive pedal pulses. Neuro: Grossly nonfocal. Follows commands appropriatelly  Lab Results:  Lab 06/09/11 0535 06/07/11 0509 06/06/11 1120  WBC 5.3 3.9* 5.6  HGB 12.4* 11.0* 11.7*  HCT 37.5* 33.3* 35.0*  PLT 172 164 148*  MCV 100.5* 100.0 99.7  MCH 33.2 33.0 33.3  MCHC 33.1 33.0 33.4  RDW 14.0 14.1 14.2  LYMPHSABS -- -- --  MONOABS -- -- --  EOSABS -- -- --  BASOSABS -- -- --  BANDABS -- -- --    Lab 06/09/11 0535 06/07/11 0509 06/06/11 1120  NA 140 138 138  K 4.4 3.9 4.4  CL 105 105 103  CO2 30 27 27   GLUCOSE 87 93 107*  BUN 28* 21 22  CREATININE 1.19 1.09 1.21  CALCIUM 9.0 8.8 9.2  MG -- -- --   Recent Results (from the past 240 hour(s))  URINE CULTURE     Status: Normal   Collection Time   06/06/11  11:46 AM      Component Value Range Status Comment   Specimen Description URINE, CLEAN CATCH   Final    Special Requests NONE   Final    Culture  Setup Time [id number]   Final    Colony Count >=100,000 COLONIES/ML   Final    Culture ESCHERICHIA COLI   Final    Report Status 06/08/2011 FINAL   Final    Organism ID, Bacteria ESCHERICHIA COLI   Final     Studies/Results: No results found.  Medications: Scheduled Meds:   . ciprofloxacin  250 mg Oral BID  . divalproex  500 mg Oral Q12H  . enoxaparin  40 mg Subcutaneous Q24H  . gabapentin  600 mg Oral QHS  . levothyroxine  100 mcg Oral QAC breakfast  . memantine  10 mg Oral BID  . methadone  15 mg Oral Q8H  . mulitivitamin with minerals  1 tablet Oral Daily  . pantoprazole  40 mg Oral Q1200  . rivastigmine  4.6 mg Transdermal Daily  . sodium chloride  3 mL Intravenous Q12H   Continuous Infusions:  PRN Meds:.sodium chloride, acetaminophen, acetaminophen, bisacodyl, clonazePAM, ketorolac, LORazepam,  ondansetron (ZOFRAN) IV, ondansetron, sodium chloride, temazepam  Assessment/Plan:  Principal Problem:  *Toxic encephalopathy  - appears to be clinically improving  - psych consult appreciated, continue Depakote, Klonopin  - placement pending   Active Problems:  E. Coli Urinary tract infection  - continue Cipro   Dementia with behavioral disturbance / Suicidal ideation  - sitter at bedside  - placement pending   Hypertension  - stable  - continue to monitor the vitals per floor protocol   Hypothyroidism  - TSH slightly low on his current dose of Synthroid  - readjust the dose   Gastroesophageal reflux disease  - We'll continue the patient's PPI therapy.   Polypharmacy / Chronic back pain.  - pain controlled on current medication regimen   Wounds, multiple  - Patient's upper extremity wounds appear to be superficial and not infected. We'll apply Neosporin but leave open to air.   EDUCATION  - test results and  diagnostic studies were discussed with patient and wife over the phone  - patient verbalized the understanding  - questions were answered at the bedside and contact information was provided for additional questions or concerns    LOS: 5 days   [name] 06/11/2011, 10:00 AM  [facility] Pager: [phone]

## 2011-06-11 NOTE — Progress Notes (Signed)
George Foster at [facility] faxing admission document for Pt and wife to sign.  Obtained Pt and wife's signature.    Faxed signed "Voluntary Admission" document to [facility] at [place].    Per Lanora Manis, facility ready to receive Pt.  Notified RN, MD, Pt and wife.  CSW to arrange for transportation.  Pt to be d/c'd.  Providence Crosby, LCSWA Clinical Social Work [phone]

## 2011-06-11 NOTE — Progress Notes (Signed)
2 bags of belongings were returned to the patient from [facility] in ED.  Wife at beside and accounted for all belonging in the bags. She took a large plastic bag full of medications and his 2 gold rings.  The 2 bags of clothing and Bible were sent with the patient via [facility].

## 2012-07-13 ENCOUNTER — Emergency Department (HOSPITAL_COMMUNITY): Payer: Medicare Other

## 2012-07-13 ENCOUNTER — Emergency Department (HOSPITAL_COMMUNITY)
Admission: EM | Admit: 2012-07-13 | Discharge: 2012-07-17 | Disposition: A | Discharge status: Other Acute Inpatient Hospital | Payer: Medicare Other | Attending: Emergency Medicine | Admitting: Emergency Medicine

## 2012-07-13 ENCOUNTER — Encounter (HOSPITAL_COMMUNITY): Payer: Self-pay | Admitting: *Deleted

## 2012-07-13 DIAGNOSIS — E039 Hypothyroidism, unspecified: Secondary | ICD-10-CM | POA: Insufficient documentation

## 2012-07-13 DIAGNOSIS — F0391 Unspecified dementia with behavioral disturbance: Secondary | ICD-10-CM

## 2012-07-13 DIAGNOSIS — I10 Essential (primary) hypertension: Secondary | ICD-10-CM | POA: Insufficient documentation

## 2012-07-13 DIAGNOSIS — F03918 Unspecified dementia, unspecified severity, with other behavioral disturbance: Secondary | ICD-10-CM | POA: Insufficient documentation

## 2012-07-13 DIAGNOSIS — F411 Generalized anxiety disorder: Secondary | ICD-10-CM | POA: Insufficient documentation

## 2012-07-13 DIAGNOSIS — Z79899 Other long term (current) drug therapy: Secondary | ICD-10-CM | POA: Insufficient documentation

## 2012-07-13 DIAGNOSIS — F419 Anxiety disorder, unspecified: Secondary | ICD-10-CM

## 2012-07-13 DIAGNOSIS — G8929 Other chronic pain: Secondary | ICD-10-CM | POA: Insufficient documentation

## 2012-07-13 DIAGNOSIS — Z8739 Personal history of other diseases of the musculoskeletal system and connective tissue: Secondary | ICD-10-CM | POA: Insufficient documentation

## 2012-07-13 DIAGNOSIS — K219 Gastro-esophageal reflux disease without esophagitis: Secondary | ICD-10-CM | POA: Insufficient documentation

## 2012-07-13 DIAGNOSIS — M549 Dorsalgia, unspecified: Secondary | ICD-10-CM | POA: Insufficient documentation

## 2012-07-13 DIAGNOSIS — Z008 Encounter for other general examination: Secondary | ICD-10-CM | POA: Insufficient documentation

## 2012-07-13 LAB — COMPREHENSIVE METABOLIC PANEL
Albumin: 4.1 g/dL (ref 3.5–5.2)
Alkaline Phosphatase: 69 U/L (ref 39–117)
BUN: 30 mg/dL — ABNORMAL HIGH (ref 6–23)
Calcium: 9.6 mg/dL (ref 8.4–10.5)
GFR calc Af Amer: 50 mL/min — ABNORMAL LOW (ref >90)
Glucose, Bld: 118 mg/dL — ABNORMAL HIGH (ref 70–99)
Potassium: 4.1 mEq/L (ref 3.5–5.1)
Total Protein: 7 g/dL (ref 6.0–8.3)

## 2012-07-13 LAB — CBC
HCT: 35.3 % — ABNORMAL LOW (ref 39.0–52.0)
MCH: 32.9 pg (ref 26.0–34.0)
MCHC: 33.7 g/dL (ref 30.0–36.0)
RDW: 13.1 % (ref 11.5–15.5)

## 2012-07-13 LAB — URINALYSIS, ROUTINE W REFLEX MICROSCOPIC
Bilirubin Urine: NEGATIVE
Glucose, UA: NEGATIVE mg/dL
Hgb urine dipstick: NEGATIVE
Ketones, ur: NEGATIVE mg/dL
Nitrite: NEGATIVE
Specific Gravity, Urine: 1.026 (ref 1.005–1.030)
pH: 5 (ref 5.0–8.0)

## 2012-07-13 LAB — SALICYLATE LEVEL: Salicylate Lvl: <2 mg/dL — ABNORMAL LOW (ref 2.8–20.0)

## 2012-07-13 LAB — RAPID URINE DRUG SCREEN, HOSP PERFORMED
Barbiturates: NOT DETECTED
Benzodiazepines: POSITIVE — AB
Cocaine: NOT DETECTED
Opiates: NOT DETECTED

## 2012-07-13 LAB — ETHANOL: Alcohol, Ethyl (B): <11 mg/dL (ref 0–11)

## 2012-07-13 LAB — ACETAMINOPHEN LEVEL: Acetaminophen (Tylenol), Serum: <15 ug/mL (ref 10–30)

## 2012-07-13 NOTE — ED Notes (Addendum)
Patient showing signs of altered mental status. He has a history of dementia.  Patient notified for the need of urine.

## 2012-07-13 NOTE — ED Notes (Signed)
ZOX:[id number]<AV> Expected date:07/13/12<BR> Expected time: 7:37 PM<BR> Means of arrival:Ambulance<BR> Comments:<BR> 79 yo M  Dementia  Aggressive

## 2012-07-13 NOTE — ED Notes (Signed)
EMS called to [facility].  Found patient sitting in chair.  Wife took out IVC papers on him  Due to increasing dementia and threatening behaviors toward his wife.

## 2012-07-13 NOTE — ED Notes (Signed)
Sitter at bedside.

## 2012-07-13 NOTE — ED Notes (Signed)
Patient transported to CT 
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
## 2012-07-14 DIAGNOSIS — F0390 Unspecified dementia without behavioral disturbance: Secondary | ICD-10-CM

## 2012-07-14 DIAGNOSIS — F411 Generalized anxiety disorder: Secondary | ICD-10-CM

## 2012-07-14 MED ORDER — ADULT MULTIVITAMIN W/MINERALS CH
1.0000 | ORAL_TABLET | Freq: Every day | ORAL | Status: DC
  Administered 2012-07-14 – 2012-07-17 (×4): 1 via ORAL
  Filled 2012-07-14 (×5): qty 1

## 2012-07-14 MED ORDER — BISACODYL 5 MG PO TBEC
5.0000 mg | DELAYED_RELEASE_TABLET | Freq: Every day | ORAL | Status: DC | PRN
  Administered 2012-07-14: 5 mg via ORAL
  Filled 2012-07-14: qty 1

## 2012-07-14 MED ORDER — THERA M PLUS PO TABS: 1.0000 | ORAL_TABLET | Freq: Every day | ORAL | Status: DC

## 2012-07-14 MED ORDER — TEMAZEPAM 15 MG PO CAPS
15.0000 mg | ORAL_CAPSULE | Freq: Every day | ORAL | Status: DC
  Administered 2012-07-14 – 2012-07-16 (×3): 15 mg via ORAL
  Filled 2012-07-14 (×3): qty 1

## 2012-07-14 MED ORDER — ESCITALOPRAM OXALATE 10 MG PO TABS
5.0000 mg | ORAL_TABLET | Freq: Every day | ORAL | Status: DC
  Administered 2012-07-14 – 2012-07-17 (×4): 5 mg via ORAL
  Filled 2012-07-14 (×6): qty 1

## 2012-07-14 MED ORDER — RISPERIDONE 0.5 MG PO TABS
0.5000 mg | ORAL_TABLET | Freq: Two times a day (BID) | ORAL | Status: DC | PRN
  Administered 2012-07-14 – 2012-07-16 (×3): 0.5 mg via ORAL
  Filled 2012-07-14 (×3): qty 1

## 2012-07-14 MED ORDER — LORAZEPAM 0.5 MG PO TABS
0.5000 mg | ORAL_TABLET | Freq: Three times a day (TID) | ORAL | Status: DC
  Administered 2012-07-14 – 2012-07-17 (×8): 0.5 mg via ORAL
  Filled 2012-07-14 (×9): qty 1

## 2012-07-14 MED ORDER — GABAPENTIN 300 MG PO CAPS
600.0000 mg | ORAL_CAPSULE | Freq: Two times a day (BID) | ORAL | Status: DC
  Administered 2012-07-14 – 2012-07-17 (×6): 600 mg via ORAL
  Filled 2012-07-14 (×7): qty 2

## 2012-07-14 MED ORDER — TAMSULOSIN HCL 0.4 MG PO CAPS
0.4000 mg | ORAL_CAPSULE | Freq: Every day | ORAL | Status: DC
  Administered 2012-07-14 – 2012-07-17 (×4): 0.4 mg via ORAL
  Filled 2012-07-14 (×5): qty 1

## 2012-07-14 MED ORDER — METHADONE HCL 10 MG PO TABS
10.0000 mg | ORAL_TABLET | Freq: Three times a day (TID) | ORAL | Status: DC
  Administered 2012-07-14 – 2012-07-17 (×8): 10 mg via ORAL
  Filled 2012-07-14 (×8): qty 1

## 2012-07-14 MED ORDER — LEVOTHYROXINE SODIUM 100 MCG PO TABS
100.0000 ug | ORAL_TABLET | Freq: Every day | ORAL | Status: DC
  Administered 2012-07-15 – 2012-07-17 (×3): 100 ug via ORAL
  Filled 2012-07-14 (×5): qty 1

## 2012-07-14 MED ORDER — PANTOPRAZOLE SODIUM 40 MG PO TBEC
40.0000 mg | DELAYED_RELEASE_TABLET | Freq: Every day | ORAL | Status: DC
  Administered 2012-07-14 – 2012-07-17 (×4): 40 mg via ORAL
  Filled 2012-07-14 (×5): qty 1

## 2012-07-14 MED ORDER — DUTASTERIDE 0.5 MG PO CAPS
0.5000 mg | ORAL_CAPSULE | Freq: Every day | ORAL | Status: DC
  Filled 2012-07-14: qty 1

## 2012-07-14 MED ORDER — LORAZEPAM 2 MG/ML IJ SOLN
1.0000 mg | Freq: Once | INTRAMUSCULAR | Status: AC
  Administered 2012-07-14: 1 mg via INTRAVENOUS
  Filled 2012-07-14: qty 1

## 2012-07-14 MED ORDER — LORAZEPAM 1 MG PO TABS: 1.0000 mg | ORAL_TABLET | Freq: Once | ORAL | Status: DC

## 2012-07-14 MED ORDER — DUTASTERIDE 0.5 MG PO CAPS
0.5000 mg | ORAL_CAPSULE | Freq: Every day | ORAL | Status: DC
  Administered 2012-07-14 – 2012-07-17 (×4): 0.5 mg via ORAL
  Filled 2012-07-14 (×5): qty 1

## 2012-07-14 MED ORDER — LEVOTHYROXINE SODIUM 100 MCG PO TABS
100.0000 ug | ORAL_TABLET | Freq: Every day | ORAL | Status: DC
  Filled 2012-07-14: qty 1

## 2012-07-14 MED ORDER — GABAPENTIN 600 MG PO TABS
600.0000 mg | ORAL_TABLET | Freq: Two times a day (BID) | ORAL | Status: DC
  Filled 2012-07-14: qty 1

## 2012-07-14 MED ORDER — DUTASTERIDE-TAMSULOSIN HCL 0.5-0.4 MG PO CAPS: 1.0000 | ORAL_CAPSULE | Freq: Every day | ORAL | Status: DC
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
## 2012-07-14 NOTE — ED Notes (Signed)
PT WILL NOT STAY IN BED, HE KEEPS GETTING UP, ASKING FOR HIS WIFE, AN ENVELOPE AND SOME TICKETS.
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
## 2012-07-14 NOTE — Progress Notes (Signed)
CM made attempt to review previous avs to assist with obtaining correct medication list for pt but unable to find a pharmacy listed  CM spoke with pt but he is a poor historian Mrs Cordell contacted by CM and she agreed to assist to clarify pt's medication list as following   bisacodyl (BISACODYL) 5 MG EC tablet Oral Takes 5 mg by mouth daily as needed. For constipation.   gabapentin (NEURONTIN) 600 MG tablet Oral Takes 600 mg am, 600 mg pm for neuralgia   levothyroxine (SYNTHROID, LEVOTHROID) 100 MCG tablet oral Takes 100 mcg by mouth daily.    methadone (DOLOPHINE) 10 MG tablet Oral Take 10 mg by mouth TID for degenerative arthritis, degenerative joint disease and spurs in his back.   Multiple Vitamins-Minerals (MULTIVITAMINS THER. W/MINERALS) TABS Oral Take 1 tablet by mouth daily.    omeprazole (PRILOSEC) 40 MG capsule Oral Take 40 mg by mouth daily.  temazepam (RESTORIL) 15 MG capsule Oral Take 15 mg by mouth at bedtime as needed. For sleep.  Ativan tid  jalyn qd for his prostate   Wife states most of patients medications are obtained through mail order except his methadone which is obtained at their local [facility] or [facility]. Wife thanked CM for calling States she wanted to make sure pt "got all his medicines"

## 2012-07-14 NOTE — Progress Notes (Signed)
CSW left message with pt spouse to discuss pt placement status. Awaiting return call from regional director for a skilled nursing facility.   [name], LCSWA  [phone] 07/14/2012 1742pm

## 2012-07-14 NOTE — Consult Note (Signed)
Reason for Consult: dementia, anxiety and agitation Referring Physician: Dr. Violeta Gelinas is an 79 y.o. male.  HPI: Patient was seen and chart reviewed. 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 He was recently admitted to [facility] from [facility] for similar problems. Patient wife was unable to care for him. He has denied depression, mania and psychosis, paranoia, auditory and visual hallucinations. Patient has been stressed about not finding or communicating with his wife. He denied being aggressive to his wife.   MSE: Patient was appeared standing in front of his door and stated that he is looking for his wife. He has unshaven beard. He was very anxious and worried. He was easily redirectable and fairly cooperative. He knows his first and last name, DOB, name of the city but not oriented to year, day, date and his problems. He has normal speech and limited thought process. He has poor insight, judgment and impulse control.  Past Medical History  Diagnosis Date  . Dementia   . Hypertension   . Arthritis   . Hypothyroidism   . Chronic back pain   . Anxiety disorder   . Gastroesophageal reflux disease   . Polypharmacy     History of hospitalizations for altered mental status related to medications    Past Surgical History  Procedure Laterality Date  . Appendectomy    . Removal of nonrestorable teeth numbers 2, 4, 6, 12, 13, 18, 29      Family History  Problem Relation Age of Onset  . Heart disease Father     Social History:  reports that he has never smoked. He has never used smokeless tobacco. He reports that he does not drink alcohol or use illicit drugs.  Allergies: No Known Allergies  Medications: I have reviewed the patient's current medications.  Results for orders placed during the hospital encounter of 07/13/12 (from the past 48 hour(s))  ACETAMINOPHEN  LEVEL     Status: None   Collection Time    07/13/12  9:05 PM      Result Value Range   Acetaminophen (Tylenol), Serum <15.0  10 - 30 ug/mL   Comment:            THERAPEUTIC CONCENTRATIONS VARY     SIGNIFICANTLY. A RANGE OF 10-30     ug/mL MAY BE AN EFFECTIVE     CONCENTRATION FOR MANY PATIENTS.     HOWEVER, SOME ARE BEST TREATED     AT CONCENTRATIONS OUTSIDE THIS     RANGE.     ACETAMINOPHEN CONCENTRATIONS     >150 ug/mL AT 4 HOURS AFTER     INGESTION AND >50 ug/mL AT 12     HOURS AFTER INGESTION ARE     OFTEN ASSOCIATED WITH TOXIC     REACTIONS.  CBC     Status: Abnormal   Collection Time    07/13/12  9:05 PM      Result Value Range   WBC 4.3  4.0 - 10.5 K/uL   RBC 3.62 (*) 4.22 - 5.81 MIL/uL   Hemoglobin 11.9 (*) 13.0 - 17.0 g/dL   HCT 16.1 (*) 09.6 - 04.5 %   MCV 97.5  78.0 - 100.0 fL   MCH 32.9  26.0 - 34.0 pg   MCHC 33.7  30.0 - 36.0 g/dL   RDW 40.9  81.1 - 91.4 %   Platelets 201  150 - 400  K/uL  COMPREHENSIVE METABOLIC PANEL     Status: Abnormal   Collection Time    07/13/12  9:05 PM      Result Value Range   Sodium 140  135 - 145 mEq/L   Potassium 4.1  3.5 - 5.1 mEq/L   Chloride 104  96 - 112 mEq/L   CO2 25  19 - 32 mEq/L   Glucose, Bld 118 (*) 70 - 99 mg/dL   BUN 30 (*) 6 - 23 mg/dL   Creatinine, Ser 1.61 (*) 0.50 - 1.35 mg/dL   Calcium 9.6  8.4 - 09.6 mg/dL   Total Protein 7.0  6.0 - 8.3 g/dL   Albumin 4.1  3.5 - 5.2 g/dL   AST 30  0 - 37 U/L   ALT 8  0 - 53 U/L   Alkaline Phosphatase 69  39 - 117 U/L   Total Bilirubin 0.4  0.3 - 1.2 mg/dL   GFR calc non Af Amer 44 (*) >90 mL/min   GFR calc Af Amer 50 (*) >90 mL/min   Comment:            The eGFR has been calculated     using the CKD EPI equation.     This calculation has not been     validated in all clinical     situations.     eGFR's persistently     <90 mL/min signify     possible Chronic Kidney Disease.  ETHANOL     Status: None   Collection Time    07/13/12  9:05 PM      Result Value  Range   Alcohol, Ethyl (B) <11  0 - 11 mg/dL   Comment:            LOWEST DETECTABLE LIMIT FOR     SERUM ALCOHOL IS 11 mg/dL     FOR MEDICAL PURPOSES ONLY  SALICYLATE LEVEL     Status: Abnormal   Collection Time    07/13/12  9:05 PM      Result Value Range   Salicylate Lvl <2.0 (*) 2.8 - 20.0 mg/dL  URINE RAPID DRUG SCREEN (HOSP PERFORMED)     Status: Abnormal   Collection Time    07/13/12  9:20 PM      Result Value Range   Opiates NONE DETECTED  NONE DETECTED   Cocaine NONE DETECTED  NONE DETECTED   Benzodiazepines POSITIVE (*) NONE DETECTED   Amphetamines NONE DETECTED  NONE DETECTED   Tetrahydrocannabinol NONE DETECTED  NONE DETECTED   Barbiturates NONE DETECTED  NONE DETECTED   Comment:            DRUG SCREEN FOR MEDICAL PURPOSES     ONLY.  IF CONFIRMATION IS NEEDED     FOR ANY PURPOSE, NOTIFY LAB     WITHIN 5 DAYS.                LOWEST DETECTABLE LIMITS     FOR URINE DRUG SCREEN     Drug Class       Cutoff (ng/mL)     Amphetamine      1000     Barbiturate      200     Benzodiazepine   200     Tricyclics       300     Opiates          300     Cocaine  300     THC              50  URINALYSIS, ROUTINE W REFLEX MICROSCOPIC     Status: None   Collection Time    07/13/12  9:20 PM      Result Value Range   Color, Urine YELLOW  YELLOW   APPearance CLEAR  CLEAR   Specific Gravity, Urine 1.026  1.005 - 1.030   pH 5.0  5.0 - 8.0   Glucose, UA NEGATIVE  NEGATIVE mg/dL   Hgb urine dipstick NEGATIVE  NEGATIVE   Bilirubin Urine NEGATIVE  NEGATIVE   Ketones, ur NEGATIVE  NEGATIVE mg/dL   Protein, ur NEGATIVE  NEGATIVE mg/dL   Urobilinogen, UA 1.0  0.0 - 1.0 mg/dL   Nitrite NEGATIVE  NEGATIVE   Leukocytes, UA NEGATIVE  NEGATIVE   Comment: MICROSCOPIC NOT DONE ON URINES WITH NEGATIVE PROTEIN, BLOOD, LEUKOCYTES, NITRITE, OR GLUCOSE <1000 mg/dL.    Dg Chest 2 View  07/13/2012  *RADIOLOGY REPORT*  Clinical Data: Shortness of breath.  Weakness.  Dementia.  CHEST - 2  VIEW  Comparison: 06/06/2011  Findings: Remote right rib trauma. Midline trachea.  Mild cardiomegaly with tortuous thoracic aorta.  Mild left hemidiaphragm elevation. No pleural effusion or pneumothorax.  Favor artifactual increased density projecting over the left apex.  IMPRESSION: Cardiomegaly and mild left base scarring. No acute findings.   Original Report Authenticated By: Jeronimo Greaves, M.D.    Ct Head Wo Contrast  07/13/2012  *RADIOLOGY REPORT*  Clinical Data: Medical clearance.  Altered mental status. Demented.  CT HEAD WITHOUT CONTRAST  Technique:  Contiguous axial images were obtained from the base of the skull through the vertex without contrast.  Comparison: 06/06/2011  Findings: Bone windows demonstrate surgical changes about the globes. Clear paranasal sinuses and mastoid air cells.  Soft tissue windows demonstrate advanced cerebral atrophy. Moderate low density in the periventricular white matter likely related to small vessel disease.  Similar. No  mass lesion, hemorrhage, hydrocephalus, acute infarct, intra-axial, or extra- axial fluid collection.  IMPRESSION:  1. No acute intracranial abnormality. 2. Cerebral atrophy and small vessel ischemic change.   Original Report Authenticated By: Jeronimo Greaves, M.D.     Positive for anxiety, bad mood, sleep disturbance and memory loss Blood pressure 137/91, pulse 86, temperature 98 F (36.7 C), temperature source Oral, resp. rate 16, SpO2 96.00%.   Assessment/Plan: Dementia NOS with agitation Generalized Anxiety disorder  Recommendation: Patient does not meet criteria for acute psychiatric hospitaliization and will be referred to social service to obtain more information from his family and out of home placement needs. Will provide ativan, Lexapro for anxiety and risperidone PRN for agitation.   Valla Pacey,JANARDHAHA R. 07/14/2012, 12:00 PM

## 2012-07-14 NOTE — ED Notes (Signed)
Pt just now fell asleep after being awake all night and all day today, called EDP, EDP is ok with [place] waiting to do a set of vital signs once pt wakes up.
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
## 2012-07-14 NOTE — ED Notes (Addendum)
Pt repeatedly asking to use the bathroom, upon assessment pt has an impaction that he cannot pass, small amount of bright red bleeding, BS present in all quadrants, EDP notified, order to disimpact received.

## 2012-07-14 NOTE — Progress Notes (Deleted)
Patient accepted to [facility] per Delorise Shiner by Dr. Avie Echevaria. RN can call report to [phone].   Marland KitchenCatha Gosselin, Theresia Majors  [phone] .07/14/2012 1624pm

## 2012-07-14 NOTE — Progress Notes (Signed)
ED CM spoke with Dr Freida Busman to discuss clarification of medications with spouse Medications ordered by Dr Freida Busman See orders/MAR
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
## 2012-07-14 NOTE — ED Notes (Signed)
Twice patient has made reference to a cat or kitten being on his bed and acts as though he's petting it as well.

## 2012-07-15 MED ORDER — ACETAMINOPHEN 325 MG PO TABS
650.0000 mg | ORAL_TABLET | Freq: Once | ORAL | Status: AC
  Administered 2012-07-15: 650 mg via ORAL
  Filled 2012-07-15: qty 1

## 2012-07-15 MED ORDER — ENSURE COMPLETE PO LIQD
237.0000 mL | Freq: Two times a day (BID) | ORAL | Status: DC
  Administered 2012-07-15 – 2012-07-17 (×6): 237 mL via ORAL
  Filled 2012-07-15 (×6): qty 237

## 2012-07-15 NOTE — ED Notes (Signed)
Pt was given water to drink. EDP in seeing pt currently.

## 2012-07-15 NOTE — ED Provider Notes (Signed)
7:37 AM Patient with severe dementia and becoming more agitated at home and family is concerned about their safety.  Family refusing sig the patient home.  Social work is involved.  Long-term care facility being sought for this patient.  Medical issues seem stable in the emergency department.  We'll continue to follow closely.   Lyanne Co, MD 07/15/12 [phone]

## 2012-07-15 NOTE — ED Notes (Signed)
Per M.  Seagraves RN pt was being assisted to the bathroom and was not able to control his bowels and had a bowel movement before getting. to the toilet.  Pt cleaned/scrubs changed/linens changed.

## 2012-07-15 NOTE — ED Notes (Signed)
Pt assisted back to bed

## 2012-07-15 NOTE — Progress Notes (Signed)
Called Pt's wife and Pt's wife's brother, George Foster, intercepted the call.  George Foster was adamant that he nor his sister were to talk with anyone from [facility] about Pt, as stipulated in the 50-B.  He stated that [facility] is considered a "3rd party" and that, per judge's orders, they cannot talk to 3rd parties.  He stated, "Whatever happens to [name] from here on out is not of our concern.  We don't want anything else to do with him."  CSW requested information on other family members.  CSW was given information on Pt's brother who lives in [place]:  George Foster [phone]  CSW thanked George Foster for his time.  Providence Crosby, LCSWA Clinical Social Work [phone]

## 2012-07-15 NOTE — ED Notes (Signed)
Pt took majority of AM meds w/ cholate ensure milk shake.  Pt alert, cooperative, attempts to answer questions but does not know where he is or how he got here, asking questions about his wife.  Reasurred, positioned for comfort.

## 2012-07-15 NOTE — ED Notes (Signed)
Pt appears to have more clarity tonight. He talked about being a Education officer, environmental for several years. He also talked about having full on Alzheizmer's and other medical conditions as well. He's been pleasant all night.

## 2012-07-15 NOTE — ED Provider Notes (Signed)
Called to the psychiatric ED because patient was becoming agitated. Patient had his prescribed medications. Patient would not lay down for the nursing staff and then asked if they could give him any further medications-I obtained an EKG to obtain QTc interval and then went to evaluate the patient. Patient was resting comfortably in bed, sleepy, listen to his heart and lungs, in no complaints. Will not require any further medication at this time.  Date: 07/15/2012  Rate: 104  Rhythm: Sinus tachycardia QRS Axis: normal  Intervals: normal  ST/T Wave abnormalities: normal  Conduction Disutrbances: none  Narrative Interpretation: Sinus tachycardia      Jones Skene, MD 07/15/12 [phone]

## 2012-07-15 NOTE — ED Notes (Signed)
Pt up to the bathroom w/ assist (voided only).  Pt alert, pleasant, cooperative, confused.  Reasurred.  Pt assisted into recliner for comfort w/o difficulty, pt watching tv.

## 2012-07-15 NOTE — ED Notes (Signed)
eatting supper w/o difficulty

## 2012-07-15 NOTE — ED Notes (Signed)
Up to the bathroom w/ assitance

## 2012-07-15 NOTE — ED Notes (Signed)
Per Leann in pharmacy Melatonin is not allowed in the hospital because it's an herbal.

## 2012-07-15 NOTE — ED Notes (Signed)
Pt keeps trying to get up out of bed. His demeanor is changing, he's becoming more and more agitated. He's also taking his pants off in his bed. All medication has been given that can be given. Tried to call EDP but phone rolling over. Will attempt to obtain new orders.

## 2012-07-15 NOTE — ED Notes (Signed)
Pt declined to eat lunch, but did drink a ensure "milk shake" w/ his medications

## 2012-07-15 NOTE — ED Notes (Addendum)
Pt sleeping, arousable, refused to eat/drink or take his medications

## 2012-07-15 NOTE — ED Notes (Signed)
Pt asked for something for headache as he feels that's preventing him from going to sleep currently. EDP contacted, new orders will be put in the system

## 2012-07-16 MED ORDER — LORAZEPAM 2 MG/ML IJ SOLN
1.0000 mg | Freq: Once | INTRAMUSCULAR | Status: AC
  Administered 2012-07-16: 1 mg via INTRAMUSCULAR
  Filled 2012-07-16: qty 1

## 2012-07-16 NOTE — ED Notes (Signed)
Up in hall, ambulatory w/o difficulty,, talkative, wanting to leave, difficulty to redirect.  Declined to return to room.

## 2012-07-16 NOTE — ED Notes (Signed)
Pt directed back to room by security and medicated w/o difficulty.  Pt pleasant and cooperative w/ security and other staff.

## 2012-07-16 NOTE — ED Notes (Signed)
Up in hall talking w/ security

## 2012-07-16 NOTE — ED Notes (Signed)
Up to the bathroom w/ assist 

## 2012-07-16 NOTE — ED Notes (Signed)
Pt sitting in chair.   Per day shift, pt very agitated and verbally threatened staff.  Unsure if patient is just being playful.   Pt reports "i will knock your head off"  To previous tech.  Pt not physically aggressive.  Pt reports "I like boxing and shadow boxing"  Pt laughs with staff while talking about boxing.   Pt reports "i am not a violent man, i just have this disease that makes me sick sometimes."

## 2012-07-16 NOTE — Progress Notes (Signed)
Spoke to [place] in SW and she is aware of pt and is working on [employer].  SW will keep ED updated.  For more info call [phone] for SW phone line.

## 2012-07-16 NOTE — ED Notes (Signed)
Patient sitting up in bed; no s/s of distress noted at this time. Pt pleasant and cooperative currently, but forgetful during conversation.

## 2012-07-16 NOTE — ED Notes (Signed)
Standing in hall, angry at this RN for not allowing him to leave or give him his shoes, confused, talkative.  Pt responded to [facility] and agreed to take medication and return to room.

## 2012-07-16 NOTE — ED Notes (Signed)
RN remains at bedside for safety.

## 2012-07-16 NOTE — ED Notes (Signed)
Patient continually getting out of bed and is unsteady and leaning backwards almost falling. Pt needing constant supervision while OOB.

## 2012-07-16 NOTE — ED Provider Notes (Signed)
Pt sleeping, nurses report he slept better tonight than last night. He was up some but they report he was easily redirected. Pt will need social work to get placement for patient, psych feels he will not benefit from inpatient treatment and wife refuses to take patient home.    Devoria Albe, MD, FACEPWard Givens, MD 07/16/12 [phone]

## 2012-07-16 NOTE — ED Notes (Addendum)
Back up in hall, amulatory w/o difficulty, leaning on the wall at times. refuses to return to room, remains angry w/ this writer, hitting fist in palm of hand at times.

## 2012-07-16 NOTE — ED Notes (Signed)
Assisted back to room, still aggitated at times, but more easily redirected

## 2012-07-16 NOTE — ED Notes (Signed)
Up in hall walking w/ NT

## 2012-07-16 NOTE — ED Notes (Signed)
Sitting in recliner in front of NS praying and singing, calmer.

## 2012-07-16 NOTE — ED Notes (Signed)
Pt becoming agitated and repeatedly getting OOB, with unsteady gait. Pt requesting to walk to bathroom to urinate. Pt given a urinal due to weakness; pt states he did not need help; pt needing frequent redirection.

## 2012-07-16 NOTE — ED Notes (Signed)
Pt needing frequent redirection after attempting to get OOB again.

## 2012-07-16 NOTE — ED Notes (Addendum)
Pt declined to eat breakfast, but did drink the majority of his ensure and all of his orange juice.  Medications give w/o difficuity.  NAD, reasurred.

## 2012-07-16 NOTE — ED Notes (Signed)
Pt sitting up in bed.  Pt ate 100 percent of meal

## 2012-07-17 DIAGNOSIS — F0281 Dementia in other diseases classified elsewhere with behavioral disturbance: Secondary | ICD-10-CM

## 2012-07-17 DIAGNOSIS — G309 Alzheimer's disease, unspecified: Secondary | ICD-10-CM

## 2012-07-17 LAB — BASIC METABOLIC PANEL
BUN: 30 mg/dL — ABNORMAL HIGH (ref 6–23)
CO2: 28 mEq/L (ref 19–32)
Calcium: 8.8 mg/dL (ref 8.4–10.5)
Creatinine, Ser: 1.22 mg/dL (ref 0.50–1.35)
GFR calc non Af Amer: 52 mL/min — ABNORMAL LOW (ref >90)
Glucose, Bld: 125 mg/dL — ABNORMAL HIGH (ref 70–99)
Sodium: 139 mEq/L (ref 135–145)

## 2012-07-17 LAB — CBC
MCH: 32.5 pg (ref 26.0–34.0)
MCHC: 25.9 g/dL — ABNORMAL LOW (ref 30.0–36.0)
MCV: 125.8 fL — ABNORMAL HIGH (ref 78.0–100.0)
Platelets: 156 10*3/uL (ref 150–400)
RDW: 13 % (ref 11.5–15.5)

## 2012-07-17 MED ORDER — LORAZEPAM 2 MG/ML IJ SOLN
1.0000 mg | Freq: Once | INTRAMUSCULAR | Status: AC
  Administered 2012-07-17: 1 mg via INTRAMUSCULAR
  Filled 2012-07-17: qty 1

## 2012-07-17 MED ORDER — HALOPERIDOL LACTATE 5 MG/ML IJ SOLN
2.0000 mg | Freq: Once | INTRAMUSCULAR | Status: AC
Start: 2012-07-17 — End: 2012-07-17
  Administered 2012-07-17: 2 mg via INTRAMUSCULAR
  Filled 2012-07-17: qty 1

## 2012-07-17 NOTE — ED Notes (Signed)
Pt continues to be confused, yelling intermittently and talking nonsensically. Pt unable to be redirected.

## 2012-07-17 NOTE — Progress Notes (Addendum)
CSW referred patient to [facility], pending review.   CSW referred to [facility], however no availability at this time.   CSW referred to [facility] with memory care unit, pending review.   CSW referred patient to [facility], however requiring medicaid pay up for memory care.   CSW referred patient to [facility] pending review.     [name], LCSWA  [phone] .07/17/2012 10:14 am

## 2012-07-17 NOTE — ED Provider Notes (Addendum)
Pt with agitation overnight requiring haldol with good improvement.  Attempting ongoing palcement this AM, SW involved.  Has been accepted to [facility] pending lab redraw - ordered at 13:30  Accepted at [facility] by Dr. Shanon Brow, MD 07/17/12 0715  Vida Roller, MD 07/17/12 1321  Vida Roller, MD 07/17/12 1504

## 2012-07-17 NOTE — Progress Notes (Signed)
Pt accepted to [facility] pending updated CBC and BMP labs.   [name], LCSWA  [phone] .07/17/2012 1314pm

## 2012-07-17 NOTE — Progress Notes (Signed)
Per discussion with superivsor, patient hearing date for 50-B is 07/21/2012. Supervisor is contacting [employer] to determine if [facility] is able to contact patient family due to be a third party and restraining order. .Clinical social worker continuing to follow pt to assist with pt dc plans and further csw needs.   Doree Albee  [phone] 07/17/2012 11:39am

## 2012-07-17 NOTE — ED Provider Notes (Signed)
Medical screening examination/treatment/procedure(s) were conducted as a shared visit with non-physician practitioner(s) and myself.  I personally evaluated the patient during the encounter   Loren Racer, MD 07/17/12 1347

## 2012-07-17 NOTE — Consult Note (Signed)
Reason for Consult: The dementia with behavioral problems Referring Physician: Dr. Deberah Pelton is an 79 y.o. male.  HPI: Patient continued to be confused and combative with the staff members was trying to care for him. Patient required intramuscular injections and also soft restraints to prevent combative behaviors and a possible fall precautions. Patient does not want to talk much because of sedation and stated "why are you ask him to many questions". Patient has moderate dementia and does not have capacity to make medical or living arrangements. Patient spouse had power of attorney for his financial and not able to care for him at this time. Reportedly his 50 B against him.  MSE: Patient was found in his bed with soft restraints in place, he is under sedation on the medication but able to verbally respond and denied any pain or depression.  Past Medical History  Diagnosis Date  . Dementia   . Hypertension   . Arthritis   . Hypothyroidism   . Chronic back pain   . Anxiety disorder   . Gastroesophageal reflux disease   . Polypharmacy     History of hospitalizations for altered mental status related to medications    Past Surgical History  Procedure Laterality Date  . Appendectomy    . Removal of nonrestorable teeth numbers 2, 4, 6, 12, 13, 18, 29      Family History  Problem Relation Age of Onset  . Heart disease Father     Social History:  reports that he has never smoked. He has never used smokeless tobacco. He reports that he does not drink alcohol or use illicit drugs.  Allergies: No Known Allergies  Medications: I have reviewed the patient's current medications.  No results found for this or any previous visit (from the past 48 hour(s)).  No results found.  Positive for anxiety, bad mood, behavior problems, sleep disturbance and dementia Blood pressure 108/67, pulse 76, temperature 98.2 F (36.8 C), temperature source Oral, resp. rate 16, SpO2  97.00%.   Assessment/Plan: Alzheimer's dementia with behavioral problems  Recommendation: Patient benefit from acute inpatient psychiatric hospitalization for controlling come back to the heel problems associated with the dementia before he was placed out of home for long-term care.  Kodah Maret,JANARDHAHA R. 07/17/2012, 11:42 AM

## 2012-07-17 NOTE — Progress Notes (Addendum)
Pt reffered to [facility] pending review.  Pt referred to [facility], no geripsych beds.  Pt referred to [facility], no beds avialable.  Pt reffered to [facility] pending review.   Per discussion with psychiatrist, pt may benefit from inpatient psychiatric treatment.   [name], LCSWA  [phone] .07/17/2012 11:29am
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
## 2012-07-17 NOTE — Progress Notes (Signed)
Patient accepted to [facility] to Dr. Yetta Barre to room 416A. RN can call report to [phone] when [facility] arrives for transport.   [name], LCSWA  [phone] .07/17/2012 1502pm

## 2012-07-17 NOTE — Progress Notes (Addendum)
CSW left message with Pt's brother who lives in [place]: [place] [phone]. CSW also left another message for [facility] of [facility].   Per chart review, and discussion with nurses, patient requiring restraints, seeing a kitten on the bed trying to pet it, and was extremely agitated kicking staff over the weekend. CSW discussed with EDP who agreed that patient will need to be reevaluated by psychiatrist to determine if patient needs inpatient treatment at this time.   [name], Theresia Majors  [phone] .07/17/2012 9:54am    Addendum:  CSW received returned call from pt brother, Gene Hammonds. Per pt brother patient and pt brother have had a estranged relationship for the past 40 years due to family conflict. Patient brother has attempted to reach out to patient through letters, phone calls, and visits, but patient does not reciprocate. Pt brother stated he is unable to help with the situation due to his age and poor health and limited income and living in [place]. Pt brother stated he will reach out to pt son's to see if they would like to assist with situation, however would not share their names or numbers with this writer without their permission. Per pt brother, pt and pt son's also have a estranged relationship.   [name], LCSWA  [phone] .07/17/2012 10:11am

## 2012-07-17 NOTE — ED Notes (Signed)
Patient given injection as ordered by MD. Pt attempting to kick, hit and scratch staff while giving medication. Pt unable to be redirected.

## 2012-07-17 NOTE — Progress Notes (Signed)
CSW spoke with APS who is reviewing the case. CSW awaiting further information from APS.   Marland KitchenCatha Gosselin, Theresia Majors  [phone] 07/17/2012 11:06am

## 2012-07-17 NOTE — ED Notes (Signed)
Dr Norlene Campbell notified of continued behaviors and agitation; she will review chart and place orders.

## 2012-07-17 NOTE — ED Notes (Signed)
Pt becoming violent and kicking and hitting staff, unable to be redirected. Pt delusional and yelling "They're trying to burn me!".

## 2012-07-17 NOTE — Progress Notes (Signed)
CSW informed patient family of patient disposition plans of [facility] as directed by superivsor. Patient family in agreemnt. CSW informed pt wife that she will need to involved with [facility] in helping with placement if she does not wish for patient to return home once stable. Pt wife in agreement.   [name], LCSWA  [phone] .07/17/2012 1352pm

## 2012-07-18 ENCOUNTER — Telehealth: Payer: Self-pay | Admitting: Internal Medicine
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
## 2012-07-26 ENCOUNTER — Ambulatory Visit: Payer: Self-pay | Admitting: Internal Medicine

## 2012-08-23 ENCOUNTER — Non-Acute Institutional Stay (SKILLED_NURSING_FACILITY): Payer: Medicare Other | Admitting: Internal Medicine

## 2012-08-23 ENCOUNTER — Encounter: Payer: Self-pay | Admitting: Internal Medicine

## 2012-08-23 DIAGNOSIS — E039 Hypothyroidism, unspecified: Secondary | ICD-10-CM

## 2012-08-23 DIAGNOSIS — K5900 Constipation, unspecified: Secondary | ICD-10-CM

## 2012-08-23 DIAGNOSIS — F03918 Unspecified dementia, unspecified severity, with other behavioral disturbance: Secondary | ICD-10-CM

## 2012-08-23 DIAGNOSIS — M549 Dorsalgia, unspecified: Secondary | ICD-10-CM

## 2012-08-23 DIAGNOSIS — K5909 Other constipation: Secondary | ICD-10-CM

## 2012-08-23 DIAGNOSIS — F419 Anxiety disorder, unspecified: Secondary | ICD-10-CM

## 2012-08-23 DIAGNOSIS — G8929 Other chronic pain: Secondary | ICD-10-CM

## 2012-08-23 DIAGNOSIS — F411 Generalized anxiety disorder: Secondary | ICD-10-CM

## 2012-08-23 DIAGNOSIS — F0391 Unspecified dementia with behavioral disturbance: Secondary | ICD-10-CM

## 2012-08-23 DIAGNOSIS — N400 Enlarged prostate without lower urinary tract symptoms: Secondary | ICD-10-CM

## 2012-08-23 DIAGNOSIS — K219 Gastro-esophageal reflux disease without esophagitis: Secondary | ICD-10-CM

## 2012-08-23 DIAGNOSIS — M47812 Spondylosis without myelopathy or radiculopathy, cervical region: Secondary | ICD-10-CM

## 2012-08-23 DIAGNOSIS — Z79899 Other long term (current) drug therapy: Secondary | ICD-10-CM
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
## 2012-08-28 ENCOUNTER — Other Ambulatory Visit: Payer: Self-pay | Admitting: *Deleted

## 2012-08-28 MED ORDER — METHADONE HCL 10 MG PO TABS: ORAL_TABLET | ORAL | Status: DC

## 2012-08-29 ENCOUNTER — Ambulatory Visit: Payer: Self-pay | Admitting: Internal Medicine

## 2012-09-26 ENCOUNTER — Non-Acute Institutional Stay (SKILLED_NURSING_FACILITY): Payer: Medicare Other | Admitting: Adult Health

## 2012-09-26 DIAGNOSIS — G8929 Other chronic pain: Secondary | ICD-10-CM

## 2012-09-26 DIAGNOSIS — F411 Generalized anxiety disorder: Secondary | ICD-10-CM

## 2012-09-26 DIAGNOSIS — F29 Unspecified psychosis not due to a substance or known physiological condition: Secondary | ICD-10-CM

## 2012-09-26 DIAGNOSIS — K219 Gastro-esophageal reflux disease without esophagitis: Secondary | ICD-10-CM

## 2012-09-26 DIAGNOSIS — F0391 Unspecified dementia with behavioral disturbance: Secondary | ICD-10-CM

## 2012-09-26 DIAGNOSIS — F419 Anxiety disorder, unspecified: Secondary | ICD-10-CM

## 2012-09-26 DIAGNOSIS — M549 Dorsalgia, unspecified: Secondary | ICD-10-CM

## 2012-09-26 DIAGNOSIS — N400 Enlarged prostate without lower urinary tract symptoms: Secondary | ICD-10-CM

## 2012-09-26 DIAGNOSIS — F03918 Unspecified dementia, unspecified severity, with other behavioral disturbance: Secondary | ICD-10-CM

## 2012-09-26 DIAGNOSIS — E039 Hypothyroidism, unspecified: Secondary | ICD-10-CM

## 2012-12-05 ENCOUNTER — Encounter: Payer: Self-pay | Admitting: Nurse Practitioner

## 2012-12-05 ENCOUNTER — Non-Acute Institutional Stay (SKILLED_NURSING_FACILITY): Payer: Medicare Other | Admitting: Nurse Practitioner

## 2012-12-05 DIAGNOSIS — K219 Gastro-esophageal reflux disease without esophagitis: Secondary | ICD-10-CM

## 2012-12-05 DIAGNOSIS — F03918 Unspecified dementia, unspecified severity, with other behavioral disturbance: Secondary | ICD-10-CM

## 2012-12-05 DIAGNOSIS — E039 Hypothyroidism, unspecified: Secondary | ICD-10-CM

## 2012-12-05 DIAGNOSIS — I10 Essential (primary) hypertension: Secondary | ICD-10-CM

## 2012-12-05 DIAGNOSIS — F411 Generalized anxiety disorder: Secondary | ICD-10-CM

## 2012-12-05 DIAGNOSIS — F419 Anxiety disorder, unspecified: Secondary | ICD-10-CM

## 2012-12-05 DIAGNOSIS — F0391 Unspecified dementia with behavioral disturbance: Secondary | ICD-10-CM
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
## 2012-12-13 ENCOUNTER — Non-Acute Institutional Stay (SKILLED_NURSING_FACILITY): Payer: Medicare Other | Admitting: Internal Medicine

## 2012-12-13 ENCOUNTER — Encounter: Payer: Self-pay | Admitting: Internal Medicine

## 2012-12-13 DIAGNOSIS — L0291 Cutaneous abscess, unspecified: Secondary | ICD-10-CM

## 2012-12-13 DIAGNOSIS — E039 Hypothyroidism, unspecified: Secondary | ICD-10-CM

## 2012-12-13 DIAGNOSIS — K5900 Constipation, unspecified: Secondary | ICD-10-CM

## 2012-12-13 DIAGNOSIS — K5909 Other constipation: Secondary | ICD-10-CM | POA: Insufficient documentation

## 2012-12-13 NOTE — Assessment & Plan Note (Signed)
Pt is already on Mirilax 17 gm daily.Will start colace 100mg  BID;wrote order for rectal exam

## 2012-12-13 NOTE — Progress Notes (Signed)
MRN: [id number] Name: George Foster  Sex: male Age: 79 y.o. DOB: 10-Jul-1924  PSC #: Ronni Rumble Facility/Room: 126B Level Of Care: SNF Provider: Merrilee Seashore D Emergency Contacts: Extended Emergency Contact Information Primary Emergency Contact: Fraser Din Address: [address]          [place], [place] [address] [place] Home Phone: [phone] Relation: Spouse  Allergies: Review of patient's allergies indicates no known allergies.  Chief Complaint  Patient presents with  . Acute Visit  . Medical Managment of Chronic Issues    HPI: Patient is 79 y.o. male who is being seen for spontaneous rupture of R scrotal abscess. He also has some other chronic concerns.  Past Medical History  Diagnosis Date  . Dementia   . Hypertension   . Arthritis   . Hypothyroidism   . Chronic back pain   . Anxiety disorder   . Gastroesophageal reflux disease   . Polypharmacy     History of hospitalizations for altered mental status related to medications  . Chronic constipation     with fecal impactions, recurrent    Past Surgical History  Procedure Laterality Date  . Appendectomy    . Removal of nonrestorable teeth numbers 2, 4, 6, 12, 13, 18, 29        Medication List       This list is accurate as of: 12/13/12  8:36 PM.  Always use your most recent med list.               dutasteride 0.5 MG capsule  Commonly known as:  AVODART  Take 0.5 mg by mouth daily.     escitalopram 10 MG tablet  Commonly known as:  LEXAPRO  Take 10 mg by mouth daily.     EXELON 13.3 MG/24HR Pt24  Generic drug:  Rivastigmine  Place 13.3 patches onto the skin.     gabapentin 600 MG tablet  Commonly known as:  NEURONTIN  Take 600 mg by mouth 2 (two) times daily.     levothyroxine 100 MCG tablet  Commonly known as:  SYNTHROID, LEVOTHROID  Take 100 mcg by mouth daily.     LORazepam 1 MG tablet  Commonly known as:  ATIVAN  Take 0.5 mg by mouth 3 (three) times daily as  needed for anxiety.     methadone 10 MG tablet  Commonly known as:  DOLOPHINE  Take 10 mg by mouth every 8 (eight) hours as needed.     methadone 10 MG tablet  Commonly known as:  DOLOPHINE  Take one tablet at bedtime. Take one every 8 hours as needed     multivitamins ther. w/minerals Tabs tablet  Take 1 tablet by mouth daily.     omeprazole 40 MG capsule  Commonly known as:  PRILOSEC  Take 40 mg by mouth daily.     polyethylene glycol packet  Commonly known as:  MIRALAX / GLYCOLAX  Take 17 g by mouth daily.     tamsulosin 0.4 MG Caps capsule  Commonly known as:  FLOMAX  Take 0.4 mg by mouth daily.         History  Substance Use Topics  . Smoking status: Never Smoker   . Smokeless tobacco: Never Used  . Alcohol Use: No     Comment: Pt denies       Review of Systems  DATA OBTAINED: from patient, nurse GENERAL: Feels well no fevers, fatigue, appetite changes SKIN: No itching, rash; abscess R scrotum just  opened by itself and pus came out;it does hurt EYES: No eye pain, redness, discharge EARS: No earache, tinnitus, change in hearing NOSE: No congestion, drainage or bleeding  MOUTH/THROAT: No mouth or tooth pain, No sore throat, No difficulty chewing or swallowing  RESPIRATORY: No cough, wheezing, SOB CARDIAC: No chest pain, palpitations, lower extremity edema  GI: No abdominal pain, No N/V/D  No heartburn or reflux ;PT HAS NOT HAD A GOOD BM IN SEVERAL DAYS;HE DOES HAVE PROLEMS WITH IMPACTIONS GU: No dysuria, frequency or urgency, or incontinence  MUSCULOSKELETAL: No unrelieved bone/joint pain NEUROLOGIC: NO C/O PSYCHIATRIC: No overt anxiety or sadness. Sleeps well. No behavior issue.     Filed Vitals:   12/13/12 1658  BP: 119/55  Pulse: 63  Temp: 97.6 F (36.4 C)  Resp: 16    Physical Exam  GENERAL APPEARANCE: Alert, conversant. Appropriately groomed. No acute distress.  SKIN: No diaphoresis rash; SEE GU HEAD: Normocephalic, atraumatic  EYES:  Conjunctiva/lids clear. Pupils round, reactive. EOMs intact.  EARS: External exam WNL, canals clear. Hearing grossly normal.  NOSE: No deformity or discharge.  MOUTH/THROAT: Lips w/o lesions. RESPIRATORY: Breathing is even, unlabored. Lung sounds are clear   CARDIOVASCULAR: Heart RRR no murmurs, rubs or gallops. No peripheral edema.  GASTROINTESTINAL: Abdomen is soft, non-tender, not distended w/ normal bowel sounds GENITOURINARY: 1.5 cm mild fluctuance with central punctum from which pus can be expressed;small area of mild erythema surrounding MUSCULOSKELETAL: No abnormal joints or musculature NEUROLOGIC: Oriented X3. Cranial nerves 2-12 grossly intact. Moves all extremities no tremor. PSYCHIATRIC: Mood and affect appropriate to situation, no behavioral issues  Patient Active Problem List   Diagnosis Date Noted  . Chronic constipation   . Chronic back pain 06/08/2011  . Toxic encephalopathy 06/06/2011  . E. coli urinary tract infection 06/06/2011  . Dementia with behavioral disturbance 06/06/2011  . Wounds, multiple 06/06/2011  . Suicidal ideation 06/06/2011  . Hypertension   . Hypothyroidism   . Gastroesophageal reflux disease   . Anxiety disorder   . Polypharmacy    CBC    Component Value Date/Time   WBC 5.0 07/17/2012 1330   RBC 3.72* 07/17/2012 1330   HGB 12.1* 07/17/2012 1330   HCT 46.8 07/17/2012 1330   PLT 156 07/17/2012 1330   MCV 125.8* 07/17/2012 1330   MCH 32.5 07/17/2012 1330   MCHC 25.9* 07/17/2012 1330   RDW 13.0 07/17/2012 1330   LYMPHSABS 1.0 01/21/2011 1655   MONOABS 0.4 01/21/2011 1655   EOSABS 0.2 01/21/2011 1655   BASOSABS 0.0 01/21/2011 1655      CMP     Component Value Date/Time   NA 139 07/17/2012 1330   K 4.3 07/17/2012 1330   CL 103 07/17/2012 1330   CO2 28 07/17/2012 1330   GLUCOSE 125* 07/17/2012 1330   BUN 30* 07/17/2012 1330   CREATININE 1.22 07/17/2012 1330   CALCIUM 8.8 07/17/2012 1330   PROT 7.0 07/13/2012 2105   ALBUMIN 4.1 07/13/2012 2105   AST  30 07/13/2012 2105   ALT 8 07/13/2012 2105   ALKPHOS 69 07/13/2012 2105   BILITOT 0.4 07/13/2012 2105   GFRNONAA 52* 07/17/2012 1330   GFRAA 60* 07/17/2012 1330    Assessment and Plan ABSCESS SCROTUM- ruptured spontanously but still possible it will need to be opened further- elevate testicle on absorbant towel, start  doxy 100 mg BID, and send to urology or general surgery for check Chronic constipation Pt is already on Mirilax 17 gm daily.Will start colace 100mg  BID;wrote order for rectal exam  Hypothyroidism Most recent TSH is very low;will decrease synthroid to 88 mcg daily and repeat TSH in 4 weeks    Margit Hanks, MD

## 2012-12-13 NOTE — Assessment & Plan Note (Signed)
Most recent TSH is very low;will decrease synthroid to 88 mcg daily and repeat TSH in 4 weeks

## 2013-01-22 ENCOUNTER — Other Ambulatory Visit: Payer: Self-pay | Admitting: *Deleted

## 2013-01-22 MED ORDER — METHADONE HCL 10 MG PO TABS: 10.0000 mg | ORAL_TABLET | Freq: Three times a day (TID) | ORAL | Status: DC

## 2013-01-23 ENCOUNTER — Other Ambulatory Visit: Payer: Self-pay | Admitting: *Deleted

## 2013-01-23 MED ORDER — METHADONE HCL 10 MG PO TABS: ORAL_TABLET | ORAL | Status: DC

## 2013-01-31 ENCOUNTER — Non-Acute Institutional Stay (SKILLED_NURSING_FACILITY): Payer: Medicare Other | Admitting: Adult Health

## 2013-01-31 DIAGNOSIS — F0391 Unspecified dementia with behavioral disturbance: Secondary | ICD-10-CM

## 2013-01-31 DIAGNOSIS — E039 Hypothyroidism, unspecified: Secondary | ICD-10-CM

## 2013-01-31 DIAGNOSIS — F03918 Unspecified dementia, unspecified severity, with other behavioral disturbance: Secondary | ICD-10-CM

## 2013-01-31 DIAGNOSIS — I10 Essential (primary) hypertension: Secondary | ICD-10-CM

## 2013-01-31 DIAGNOSIS — G609 Hereditary and idiopathic neuropathy, unspecified: Secondary | ICD-10-CM

## 2013-01-31 DIAGNOSIS — G894 Chronic pain syndrome: Secondary | ICD-10-CM

## 2013-01-31 NOTE — Progress Notes (Signed)
Patient ID: George Foster, male   DOB: [birth year], 79 y.o.   MRN: [id number] Location:  [facility] Provider:  Gwenith Spitz. Renato Gails, D.O., C.M.D.  Code Status:  Full Code  No chief complaint on file.   HPI: 79 yo white male with h/o chronic neck and low back pain being seen today for c/o of pain. Pt is currently taking methadone, Percocet and Tylenol for pain. He has a h/o drug abuse and suicidal ideation prior to coming here for long term care.  He has no tenderness or reproducible symptoms.    Review of Systems:  ROS  Medications: Patient's Medications  New Prescriptions   No medications on file  Previous Medications   ACETAMINOPHEN (TYLENOL) 325 MG TABLET    Take 650 mg by mouth every 12 (twelve) hours.   DOCUSATE SODIUM (COLACE) 100 MG CAPSULE    Take 100 mg by mouth 2 (two) times daily.   DUTASTERIDE (AVODART) 0.5 MG CAPSULE    Take 0.5 mg by mouth daily.   ESCITALOPRAM (LEXAPRO) 10 MG TABLET    Take 10 mg by mouth daily.   GABAPENTIN (NEURONTIN) 600 MG TABLET    Take 600 mg by mouth 2 (two) times daily.    LEVOTHYROXINE (SYNTHROID, LEVOTHROID) 100 MCG TABLET    Take 88 mcg by mouth daily.    LORAZEPAM (ATIVAN) 1 MG TABLET    Take 0.5 mg by mouth every 8 (eight) hours as needed for anxiety.    METHADONE (DOLOPHINE) 10 MG TABLET    Take 10 mg by mouth every 8 (eight) hours as needed.    METHADONE (DOLOPHINE) 10 MG TABLET    Take 1 tablet (10 mg total) by mouth every 8 (eight) hours.   METHADONE (DOLOPHINE) 10 MG TABLET    Take one tablet by mouth every 8 hours for pain   METHADONE (DOLOPHINE) 10 MG TABLET    Take 10 mg by mouth at bedtime.   MULTIPLE VITAMINS-MINERALS (MULTIVITAMINS THER. W/MINERALS) TABS    Take 1 tablet by mouth daily.   OMEPRAZOLE (PRILOSEC) 40 MG CAPSULE    Take 40 mg by mouth daily.   OXYCODONE-ACETAMINOPHEN (PERCOCET/ROXICET) 5-325 MG PER TABLET    Take 1 tablet by mouth every 4 (four) hours as needed for severe pain.   POLYETHYLENE GLYCOL (MIRALAX /  GLYCOLAX) PACKET    Take 17 g by mouth daily.   RIVASTIGMINE (EXELON) 13.3 MG/24HR PT24    Place 13.3 patches onto the skin.   TAMSULOSIN (FLOMAX) 0.4 MG CAPS    Take 0.4 mg by mouth daily.  Modified Medications   No medications on file  Discontinued Medications   No medications on file    Physical Exam:  Physical Exam  Vitals reviewed. Constitutional: He is oriented to person, place, and time. He appears well-developed and well-nourished. No distress.  Cardiovascular: Normal rate, regular rhythm, normal heart sounds and intact distal pulses.   Pulmonary/Chest: Effort normal and breath sounds normal. No respiratory distress.  Abdominal: Soft. Bowel sounds are normal. He exhibits no distension and no mass. There is no tenderness.  Musculoskeletal: Normal range of motion. He exhibits no tenderness.  Neurological: He is alert and oriented to person, place, and time.  Skin: Skin is warm and dry.  Psychiatric: He has a normal mood and affect.  Poor insight and judgment, abnormal behavior     Labs reviewed: 10/22 TSH 2.590 9/22 BUN 27, Cr 1.39, Na 139, K 4.5, Protein 5.6, Alb 8.5, WBC 4.4,  Hbg 10.6, hct 30.8, hgbA1c 5.9  Assessment/Plan 1. Hypothyroidism - Controlled, 10/22 TSH 2.590 - Continue Synthroid.  2. Hypertension Controlled  3. Dementia with behavioral disturbance - Controlled - Continue Exelon and Lexapro  4. Chronic pain syndrome - Currently uses Methadone, Percocet and Tylenol to control pain -pain is located in his neck and low back -he has prior substance abuse problems and past suicidal ideation--none at present  5. Unspecified hereditary and idiopathic peripheral neuropathy - Continue neurontin  Family/ staff Communication: discussed with nursing staff  Goals of care: full code  Labs/tests ordered:  None needed at present

## 2013-02-08 ENCOUNTER — Other Ambulatory Visit: Payer: Self-pay

## 2013-02-08 MED ORDER — LORAZEPAM 0.5 MG PO TABS: ORAL_TABLET | ORAL | Status: DC

## 2013-02-08 MED ORDER — OXYCODONE-ACETAMINOPHEN 5-325 MG PO TABS: 1.0000 | ORAL_TABLET | ORAL | Status: DC | PRN

## 2013-02-12 NOTE — Progress Notes (Signed)
Patient ID: George Foster, male   DOB: 06-07-1924, 79 y.o.   MRN: [id number]     [facility]  No Known Allergies   Chief Complaint  Patient presents with  . Medical Managment of Chronic Issues    HPI  He is being seen today for the management of his chronic illnesses. There are no concerns being voiced by the nursing staff at this time.  He is not able to fully participate in the hpi or ros. He is presently being treated for bronchitis.   Past Medical History  Diagnosis Date  . Dementia   . Hypertension   . Arthritis   . Hypothyroidism   . Chronic back pain   . Anxiety disorder   . Gastroesophageal reflux disease   . Polypharmacy     History of hospitalizations for altered mental status related to medications  . Chronic constipation     with fecal impactions, recurrent     Past Surgical History  Procedure Laterality Date  . Appendectomy    . Removal of nonrestorable teeth numbers 2, 4, 6, 12, 13, 18, 29      Filed Vitals:   09/26/12 1332  BP: 132/71  Pulse: 78  Height: 5\' 10"  (1.778 m)  Weight: 182 lb (82.555 kg)    MEDICATIONS  avodart take daily exelon patch 4.6 mg daily flomax 0.4 mg daily lexapro 10 mg daily Ativan 0.5 mg every 8 hours as needed Methadone 10 mg every 8 hours as needed mvi daily neurontin 600 mg twice daily prilosec 40 mg daily risperdal 0.5 mg twice daily and every 6 hours as needed Synthroid 100 mcg daily  SIGNIFICANT STUDIES:   09-25-12: chest x-ray; bronchitis: levaquin   Review of Systems  Unable to perform ROS    Physical Exam  Constitutional: He appears well-developed. No distress.  Neck: Neck supple. No JVD present.  Cardiovascular: Normal rate, regular rhythm and intact distal pulses.   Respiratory: No respiratory distress.  Breath sound diminished  GI: Soft. Bowel sounds are normal. He exhibits no distension. There is no tenderness.  Musculoskeletal: Normal range of motion. He exhibits no edema.  Neurological: He  is alert.  Skin: Skin is warm and dry. He is not diaphoretic.     ASSESSMENT/PLAN  1. Bph: will continue avodart and flomax daily and will monitor  2. gerd will continue prilosec 40 mg daily  3. Hypthroidism: will continue 100 mcg daily  4. Dementia is without change in status will continue exelon patch 4.6 mg daily and will monitor his status  5. Anxiety; is stable will continue lexapro 10 mg daily with ativan 0.5 mg twice daily and every 8 hours as needed and will monitor  6. Psychosis: is without change will continue risperdal 0.5 mg twice daily and every 6 hours as needed  7. Chronic pain: his pain is being managed with neurontin 600 mg twice daily and methadone 10 mg every 8 hours as needed will not make changes.

## 2013-02-14 ENCOUNTER — Other Ambulatory Visit: Payer: Self-pay

## 2013-02-14 MED ORDER — TEMAZEPAM 15 MG PO CAPS: 15.0000 mg | ORAL_CAPSULE | Freq: Every day | ORAL | Status: DC | PRN

## 2013-02-17 DIAGNOSIS — N400 Enlarged prostate without lower urinary tract symptoms: Secondary | ICD-10-CM | POA: Insufficient documentation

## 2013-02-17 DIAGNOSIS — M47812 Spondylosis without myelopathy or radiculopathy, cervical region: Secondary | ICD-10-CM | POA: Insufficient documentation

## 2013-03-06 ENCOUNTER — Encounter: Payer: Self-pay | Admitting: Internal Medicine

## 2013-03-06 ENCOUNTER — Non-Acute Institutional Stay (SKILLED_NURSING_FACILITY): Payer: Medicare Other | Admitting: Internal Medicine

## 2013-03-06 DIAGNOSIS — J069 Acute upper respiratory infection, unspecified: Secondary | ICD-10-CM
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
## 2013-03-26 ENCOUNTER — Other Ambulatory Visit: Payer: Self-pay | Admitting: *Deleted

## 2013-03-26 MED ORDER — OXYCODONE-ACETAMINOPHEN 5-325 MG PO TABS: ORAL_TABLET | ORAL | Status: DC

## 2013-03-26 MED ORDER — METHADONE HCL 10 MG PO TABS: 10.0000 mg | ORAL_TABLET | Freq: Three times a day (TID) | ORAL | Status: DC

## 2013-03-26 MED ORDER — METHADONE HCL 10 MG PO TABS: ORAL_TABLET | ORAL | Status: DC

## 2013-04-25 ENCOUNTER — Non-Acute Institutional Stay (SKILLED_NURSING_FACILITY): Payer: Medicare Other | Admitting: Internal Medicine

## 2013-04-25 DIAGNOSIS — E0789 Other specified disorders of thyroid: Secondary | ICD-10-CM

## 2013-04-25 DIAGNOSIS — K5909 Other constipation: Secondary | ICD-10-CM

## 2013-04-25 DIAGNOSIS — N400 Enlarged prostate without lower urinary tract symptoms: Secondary | ICD-10-CM

## 2013-04-25 DIAGNOSIS — F03918 Unspecified dementia, unspecified severity, with other behavioral disturbance: Secondary | ICD-10-CM

## 2013-04-25 DIAGNOSIS — I10 Essential (primary) hypertension: Secondary | ICD-10-CM

## 2013-04-25 DIAGNOSIS — G894 Chronic pain syndrome: Secondary | ICD-10-CM

## 2013-04-25 DIAGNOSIS — K5900 Constipation, unspecified: Secondary | ICD-10-CM

## 2013-04-25 DIAGNOSIS — E034 Atrophy of thyroid (acquired): Secondary | ICD-10-CM

## 2013-04-25 DIAGNOSIS — F0391 Unspecified dementia with behavioral disturbance: Secondary | ICD-10-CM

## 2013-04-25 NOTE — Progress Notes (Signed)
Patient ID: George Foster, male   DOB: 1924/08/25, 80 y.o.   MRN: [id number]  Location:  [facility] Provider:  Rexene Edison. Mariea Clonts, D.O., C.M.D.  Code Status:  Full code;  Has guardian:  George Foster, social worker, DSS [facility] Complaint  Patient presents with  . Medical Managment of Chronic Issues    HPI:  80 yo male long term care resident of [facility] seen for medical mgt of his chronic diseases including hypothyroidism, chronic pain (cervical osteoarthritis, chronic low back pain), dementia with behaviors, depression and anxiety.    Apparently, had increased confusion on 04/17/13 and UA c+s was ordered.    Review of Systems:  Review of Systems  Constitutional: Negative for fever, chills and malaise/fatigue.  Eyes: Negative for blurred vision.  Respiratory: Negative for shortness of breath.   Cardiovascular: Negative for chest pain.  Gastrointestinal: Positive for constipation. Negative for abdominal pain, blood in stool and melena.  Genitourinary: Positive for frequency. Negative for dysuria and urgency.  Musculoskeletal: Positive for back pain.  Skin: Negative for itching and rash.  Neurological: Negative for loss of consciousness, weakness and headaches.  Endo/Heme/Allergies: Bruises/bleeds easily.  Psychiatric/Behavioral: Positive for depression, hallucinations and memory loss.    Medications: Patient's Medications  New Prescriptions   No medications on file  Previous Medications   ACETAMINOPHEN (TYLENOL) 325 MG TABLET    Take 650 mg by mouth every 12 (twelve) hours.   DOCUSATE SODIUM (COLACE) 100 MG CAPSULE    Take 100 mg by mouth 2 (two) times daily.   DUTASTERIDE (AVODART) 0.5 MG CAPSULE    Take 0.5 mg by mouth daily.   ESCITALOPRAM (LEXAPRO) 10 MG TABLET    Take 10 mg by mouth daily.   GABAPENTIN (NEURONTIN) 600 MG TABLET    Take 600 mg by mouth 2 (two) times daily.    LEVOTHYROXINE (SYNTHROID, LEVOTHROID) 100 MCG TABLET    Take 88 mcg by  mouth daily.    LORAZEPAM (ATIVAN) 0.5 MG TABLET    1 by mouth three times daily as needed   METHADONE (DOLOPHINE) 10 MG TABLET    Take 1 tablet (10 mg total) by mouth every 8 (eight) hours. As needed for pain   METHADONE (DOLOPHINE) 10 MG TABLET    Take one tablet by mouth at bedtime for pain   MULTIPLE VITAMINS-MINERALS (MULTIVITAMINS THER. W/MINERALS) TABS    Take 1 tablet by mouth daily.   OMEPRAZOLE (PRILOSEC) 40 MG CAPSULE    Take 40 mg by mouth daily.   OXYCODONE-ACETAMINOPHEN (PERCOCET/ROXICET) 5-325 MG PER TABLET    Take one tablet by mouth every 4 hours as needed for severe pain   RIVASTIGMINE (EXELON) 13.3 MG/24HR PT24    Place 4.6 patches onto the skin.    TAMSULOSIN (FLOMAX) 0.4 MG CAPS    Take 0.4 mg by mouth daily.  Modified Medications   No medications on file  Discontinued Medications   No medications on file    Physical Exam: There were no vitals filed for this visit. Physical Exam  Constitutional: He appears well-developed and well-nourished. No distress.  Cardiovascular: Normal rate, regular rhythm, normal heart sounds and intact distal pulses.   Pulmonary/Chest: Effort normal and breath sounds normal.  Abdominal: Soft. Bowel sounds are normal. He exhibits no distension and no mass. There is no tenderness.  Musculoskeletal: Normal range of motion. He exhibits tenderness.  Lower lumbar spine and neck  Neurological: He is alert.  Oriented to persona nd place  Skin: Skin is warm and dry.  Psychiatric:  Tangential at times, goes on about what he is writing     Labs reviewed: Basic Metabolic Panel:  Recent Labs  07/13/12 2105 07/17/12 1330  NA 140 139  K 4.1 4.3  CL 104 103  CO2 25 28  GLUCOSE 118* 125*  BUN 30* 30*  CREATININE 1.41* 1.22  CALCIUM 9.6 8.8    Liver Function Tests:  Recent Labs  07/13/12 2105  AST 30  ALT 8  ALKPHOS 69  BILITOT 0.4  PROT 7.0  ALBUMIN 4.1    CBC:  Recent Labs  07/13/12 2105 07/17/12 1330  WBC 4.3 5.0    HGB 11.9* 12.1*  HCT 35.3* 46.8  MCV 97.5 125.8*  PLT 201 156  12/11/12:  hba1c 5.9 01/10/13:  TSH 2.590 02/21/13:  Alb 3.4 03/13/13:  Flu negative (A and H1N1) 03/13/13:  Wbc 6.7, h/h 11.5/34.9, plts 209, glucose 108, BUN 29, cr 1.15, Na 137, K 5.4   Assessment/Plan 1. Dementia with behavioral disturbance -cont exelon and depakote;  Unclear why he's not on namenda xr  2. Hypothyroidism due to acquired atrophy of thyroid -cont current synthroid dose, latest tsh nl  3. Chronic pain syndrome -cont longstanding methadone and neurontin regimen for low back and neck pain with radiculopathy  4. BPH (benign prostatic hyperplasia) Cont avodart, flomax  5. Essential hypertension -bp at goal with prostate meds only  6. Chronic constipation -due to pain meds, cont colace with miralax, if problematic consider a newer prescription agent for this  Family/ staff Communication: discussed with nursing and pt  Goals of care: long term care resident, full code  Labs/tests ordered:  none

## 2013-05-15 ENCOUNTER — Non-Acute Institutional Stay (SKILLED_NURSING_FACILITY): Payer: Medicare Other | Admitting: Internal Medicine

## 2013-05-15 ENCOUNTER — Encounter: Payer: Self-pay | Admitting: Internal Medicine

## 2013-05-15 DIAGNOSIS — F1920 Other psychoactive substance dependence, uncomplicated: Secondary | ICD-10-CM

## 2013-05-15 DIAGNOSIS — M47812 Spondylosis without myelopathy or radiculopathy, cervical region: Secondary | ICD-10-CM

## 2013-05-15 NOTE — Progress Notes (Signed)
MRN: [id number] Name: George Foster  Sex: male Age: 80 y.o. DOB: [birth year]  [place] #: [facility] Facility/Room:  [address]B Level Of Care: SNF Provider: Inocencio Homes D Emergency Contacts: Extended Emergency Contact Information Primary Emergency Contact: Trinda Pascal Address: [address]          [place], [place] [address] [place] Phone: [phone] Relation: Spouse  Code Status:   Allergies: Review of patient's allergies indicates no known allergies.  Chief Complaint  Patient presents with  . Medical Managment of Chronic Issues    HPI: Patient is 80 y.o. male who the nurses have asked me to see. He has prn pain medication and every time he can use it, he does. Can we schedule some of it? Pt c/o pain in his neck for years, no new injury. No numbness or tingling to extremities.  Past Medical History  Diagnosis Date  . Dementia   . Hypertension   . Arthritis   . Hypothyroidism   . Chronic back pain   . Anxiety disorder   . Gastroesophageal reflux disease   . Polypharmacy     History of hospitalizations for altered mental status related to medications  . Chronic constipation     with fecal impactions, recurrent    Past Surgical History  Procedure Laterality Date  . Appendectomy    . Removal of nonrestorable teeth numbers 2, 4, 6, 12, 13, 18, 29        Medication List       This list is accurate as of: 05/15/13  9:24 PM.  Always use your most recent med list.               acetaminophen 325 MG tablet  Commonly known as:  TYLENOL  Take 650 mg by mouth every 12 (twelve) hours.     docusate sodium 100 MG capsule  Commonly known as:  COLACE  Take 100 mg by mouth 2 (two) times daily.     dutasteride 0.5 MG capsule  Commonly known as:  AVODART  Take 0.5 mg by mouth daily.     escitalopram 10 MG tablet  Commonly known as:  LEXAPRO  Take 10 mg by mouth daily.     EXELON 13.3 MG/24HR Pt24  Generic drug:  Rivastigmine  Place 4.6 patches  onto the skin.     gabapentin 600 MG tablet  Commonly known as:  NEURONTIN  Take 600 mg by mouth 2 (two) times daily.     levothyroxine 100 MCG tablet  Commonly known as:  SYNTHROID, LEVOTHROID  Take 88 mcg by mouth daily.     LORazepam 0.5 MG tablet  Commonly known as:  ATIVAN  1 by mouth three times daily as needed     methadone 10 MG tablet  Commonly known as:  DOLOPHINE  Take 1 tablet (10 mg total) by mouth every 8 (eight) hours. As needed for pain     methadone 10 MG tablet  Commonly known as:  DOLOPHINE  Take one tablet by mouth at bedtime for pain     multivitamins ther. w/minerals Tabs tablet  Take 1 tablet by mouth daily.     omeprazole 40 MG capsule  Commonly known as:  PRILOSEC  Take 40 mg by mouth daily.     oxyCODONE-acetaminophen 5-325 MG per tablet  Commonly known as:  PERCOCET/ROXICET  Take one tablet by mouth every 4 hours as needed for severe pain     tamsulosin 0.4 MG Caps capsule  Commonly known as:  FLOMAX  Take 0.4 mg by mouth daily.        No orders of the defined types were placed in this encounter.     There is no immunization history on file for this patient.  History  Substance Use Topics  . Smoking status: Never Smoker   . Smokeless tobacco: Never Used  . Alcohol Use: No     Comment: Pt denies    Review of Systems  DATA OBTAINED: from patient, nurse, medical record GENERAL: Feels well no fevers, fatigue, appetite changes SKIN: No itching, rash HEENT: No complaint RESPIRATORY: No cough, wheezing, SOB CARDIAC: No chest pain, palpitations, lower extremity edema  GI: No abdominal pain, No N/V/D or constipation, No heartburn or reflux  GU: No dysuria, frequency or urgency, or incontinence  MUSCULOSKELETAL: neck pain per HPI NEUROLOGIC: No headache, dizziness or focal weakness PSYCHIATRIC: No overt anxiety or sadness. Sleeps well.   Filed Vitals:   05/15/13 2016  BP: 129/57  Pulse: 69  Temp: 98.6 F (37 C)  Resp: 22     Physical Exam  GENERAL APPEARANCE: Alert, conversant. Appropriately groomed. No acute distress  SKIN: No diaphoresis rash HEENT: Unremarkable RESPIRATORY: Breathing is even, unlabored. Lung sounds are clear   CARDIOVASCULAR: Heart RRR no murmurs, rubs or gallops. No peripheral edema  GASTROINTESTINAL: Abdomen is soft, non-tender, not distended w/ normal bowel sounds.  GENITOURINARY: Bladder non tender, not distended  MUSCULOSKELETAL: No abnormal joints or musculature NEUROLOGIC: Cranial nerves 2-12 grossly intact. Moves all extremities no tremor. PSYCHIATRIC: Mood and affect appropriate to situation, no behavioral issues  Patient Active Problem List   Diagnosis Date Noted  . BPH (benign prostatic hyperplasia) 02/17/2013  . Osteoarthritis cervical spine 02/17/2013  . Chronic constipation   . Chronic back pain 06/08/2011  . Toxic encephalopathy 06/06/2011  . E. coli urinary tract infection 06/06/2011  . Dementia with behavioral disturbance 06/06/2011  . Wounds, multiple 06/06/2011  . Suicidal ideation 06/06/2011  . Hypertension   . Hypothyroidism   . Gastroesophageal reflux disease   . Anxiety disorder   . Polypharmacy     CBC    Component Value Date/Time   WBC 5.0 07/17/2012 1330   RBC 3.72* 07/17/2012 1330   HGB 12.1* 07/17/2012 1330   HCT 46.8 07/17/2012 1330   PLT 156 07/17/2012 1330   MCV 125.8* 07/17/2012 1330   LYMPHSABS 1.0 01/21/2011 1655   MONOABS 0.4 01/21/2011 1655   EOSABS 0.2 01/21/2011 1655   BASOSABS 0.0 01/21/2011 1655    CMP     Component Value Date/Time   NA 139 07/17/2012 1330   K 4.3 07/17/2012 1330   CL 103 07/17/2012 1330   CO2 28 07/17/2012 1330   GLUCOSE 125* 07/17/2012 1330   BUN 30* 07/17/2012 1330   CREATININE 1.22 07/17/2012 1330   CALCIUM 8.8 07/17/2012 1330   PROT 7.0 07/13/2012 2105   ALBUMIN 4.1 07/13/2012 2105   AST 30 07/13/2012 2105   ALT 8 07/13/2012 2105   ALKPHOS 69 07/13/2012 2105   BILITOT 0.4 07/13/2012 2105   GFRNONAA 52* 07/17/2012  1330   GFRAA 60* 07/17/2012 1330    Assessment and Plan PAIN MEDICATION DEPENDENCE/ Osteoarthritis cervical spine Pt has PMH of polypharmacy and someone put him on methadone. Together this says addict. Having said that at his age he will not ne getting off narcotics. We need to use the methadone to keep him from wanting meds every 4 hours, which is addict behavoir. Will  schedule methadone 15 mg q12 (although methadone is a q day drug but this guy will think he is getting less drug). This is a 10 mg increase daily in methadone which is reasonable. Will leave the percocet prn because the goal will be to increase the methadone until the prn percocet is not used much.    [name], MD

## 2013-05-15 NOTE — Assessment & Plan Note (Addendum)
Pt has PMH of polypharmacy and someone put him on methadone. Together this says addict. Having said that at his age he will not ne getting off narcotics. We need to use the methadone to keep him from wanting meds every 4 hours, which is addict behavoir. Will schedule methadone 15 mg q12 (although methadone is a q day drug but this guy will think he is getting less drug). This is a 10 mg increase daily in methadone which is reasonable. Will leave the percocet prn because the goal will be to increase the methadone until the prn percocet is not used much.

## 2013-05-23 ENCOUNTER — Other Ambulatory Visit: Payer: Self-pay | Admitting: *Deleted

## 2013-05-23 MED ORDER — METHADONE HCL 10 MG PO TABS: ORAL_TABLET | ORAL | Status: DC

## 2013-05-23 NOTE — Telephone Encounter (Signed)
[facility] [place] 

## 2013-06-13 ENCOUNTER — Other Ambulatory Visit: Payer: Self-pay | Admitting: *Deleted

## 2013-06-13 ENCOUNTER — Non-Acute Institutional Stay (SKILLED_NURSING_FACILITY): Payer: Medicare Other | Admitting: Internal Medicine

## 2013-06-13 DIAGNOSIS — M549 Dorsalgia, unspecified: Secondary | ICD-10-CM

## 2013-06-13 DIAGNOSIS — K589 Irritable bowel syndrome without diarrhea: Secondary | ICD-10-CM

## 2013-06-13 DIAGNOSIS — G8929 Other chronic pain: Secondary | ICD-10-CM

## 2013-06-13 MED ORDER — METHADONE HCL 10 MG PO TABS: ORAL_TABLET | ORAL | Status: DC

## 2013-06-13 NOTE — Telephone Encounter (Signed)
[facility] [place] 

## 2013-06-16 ENCOUNTER — Encounter: Payer: Self-pay | Admitting: Internal Medicine

## 2013-06-16 NOTE — Assessment & Plan Note (Signed)
Will increase methadone to 20 mg BID. Pt does have an addiction problem that is not well controlled yet. PT is very reliable in their assessments of need vs want and they believe this pt is not meeting PT goals 2/2 pain.

## 2013-06-16 NOTE — Progress Notes (Signed)
MRN: [id number] Name: George Foster  Sex: male Age: 80 y.o. DOB: Jan 03, 1925  [place] #: [facility] Facility/Room: 126B Level Of Care: SNF Provider: Inocencio Homes D Emergency Contacts: Extended Emergency Contact Information Primary Emergency Contact: Trinda Pascal Address: [address]          [place], [place] [address] [place] Phone: [phone] Relation: Spouse  Code Status: FULL  Allergies: Review of patient's allergies indicates no known allergies.  Chief Complaint  Patient presents with  . Medical Managment of Chronic Issues    HPI: Patient is 80 y.o. male who I am seeing today at the request of physical therapist.  Past Medical History  Diagnosis Date  . Dementia   . Hypertension   . Arthritis   . Hypothyroidism   . Chronic back pain   . Anxiety disorder   . Gastroesophageal reflux disease   . Polypharmacy     History of hospitalizations for altered mental status related to medications  . Chronic constipation     with fecal impactions, recurrent    Past Surgical History  Procedure Laterality Date  . Appendectomy    . Removal of nonrestorable teeth numbers 2, 4, 6, 12, 13, 18, 29        Medication List       This list is accurate as of: 06/13/13 11:59 PM.  Always use your most recent med list.               acetaminophen 325 MG tablet  Commonly known as:  TYLENOL  Take 650 mg by mouth every 12 (twelve) hours.     docusate sodium 100 MG capsule  Commonly known as:  COLACE  Take 100 mg by mouth 2 (two) times daily.     dutasteride 0.5 MG capsule  Commonly known as:  AVODART  Take 0.5 mg by mouth daily.     escitalopram 10 MG tablet  Commonly known as:  LEXAPRO  Take 10 mg by mouth daily.     EXELON 13.3 MG/24HR Pt24  Generic drug:  Rivastigmine  Place 4.6 patches onto the skin.     gabapentin 600 MG tablet  Commonly known as:  NEURONTIN  Take 600 mg by mouth 2 (two) times daily.     levothyroxine 100 MCG tablet   Commonly known as:  SYNTHROID, LEVOTHROID  Take 88 mcg by mouth daily.     LORazepam 0.5 MG tablet  Commonly known as:  ATIVAN  1 by mouth three times daily as needed     methadone 10 MG tablet  Commonly known as:  DOLOPHINE  Take 1 tablet (10 mg total) by mouth every 8 (eight) hours. As needed for pain     methadone 10 MG tablet  Commonly known as:  DOLOPHINE  Take one tablet by mouth at bedtime for pain     methadone 10 MG tablet  Commonly known as:  DOLOPHINE  Take two tablets by mouth every 12 hours for pain     multivitamins ther. w/minerals Tabs tablet  Take 1 tablet by mouth daily.     omeprazole 40 MG capsule  Commonly known as:  PRILOSEC  Take 40 mg by mouth daily.     oxyCODONE-acetaminophen 5-325 MG per tablet  Commonly known as:  PERCOCET/ROXICET  Take one tablet by mouth every 4 hours as needed for severe pain     tamsulosin 0.4 MG Caps capsule  Commonly known as:  FLOMAX  Take 0.4 mg by mouth  daily.        No orders of the defined types were placed in this encounter.     There is no immunization history on file for this patient.  History  Substance Use Topics  . Smoking status: Never Smoker   . Smokeless tobacco: Never Used  . Alcohol Use: No     Comment: Pt denies    Review of Systems  DATA OBTAINED: from physical therapist - she has two concerns for pt  #1 pt has c/o AND  has exhibited back pain that she feels is interfering with his PT; the more active he is trying to become the more pain he is having  # 2 pt is having bouts of diarrhea alternating with constipation   Filed Vitals:   06/16/13 2253  BP: 152/85  Pulse: 53  Temp: 97.6 F (36.4 C)  Resp: 21    Physical Exam  GENERAL APPEARANCE: Alert, conversant. Appropriately groomed. No acute distress  SKIN: No diaphoresis  HEENT: Unremarkable RESPIRATORY: Breathing is even, unlabored. Lung sounds are clear   CARDIOVASCULAR: Heart RRR no murmurs, rubs or gallops. No peripheral  edema  GASTROINTESTINAL: Abdomen is soft, non-tender, not distended w/ normal bowel sounds.  GENITOURINARY: Bladder non tender, not distended  MUSCULOSKELETAL: No abnormal joints or musculature NEUROLOGIC: Cranial nerves 2-12 grossly intact. Moves all extremities no tremor. PSYCHIATRIC: Mood and affect appropriate to situation, no behavioral issues  Patient Active Problem List   Diagnosis Date Noted  . BPH (benign prostatic hyperplasia) 02/17/2013  . Osteoarthritis cervical spine 02/17/2013  . Chronic constipation   . Chronic back pain 06/08/2011  . Toxic encephalopathy 06/06/2011  . E. coli urinary tract infection 06/06/2011  . Dementia with behavioral disturbance 06/06/2011  . Wounds, multiple 06/06/2011  . Suicidal ideation 06/06/2011  . Hypertension   . Hypothyroidism   . Gastroesophageal reflux disease   . Anxiety disorder   . Polypharmacy     CBC    Component Value Date/Time   WBC 5.0 07/17/2012 1330   RBC 3.72* 07/17/2012 1330   HGB 12.1* 07/17/2012 1330   HCT 46.8 07/17/2012 1330   PLT 156 07/17/2012 1330   MCV 125.8* 07/17/2012 1330   LYMPHSABS 1.0 01/21/2011 1655   MONOABS 0.4 01/21/2011 1655   EOSABS 0.2 01/21/2011 1655   BASOSABS 0.0 01/21/2011 1655    CMP     Component Value Date/Time   NA 139 07/17/2012 1330   K 4.3 07/17/2012 1330   CL 103 07/17/2012 1330   CO2 28 07/17/2012 1330   GLUCOSE 125* 07/17/2012 1330   BUN 30* 07/17/2012 1330   CREATININE 1.22 07/17/2012 1330   CALCIUM 8.8 07/17/2012 1330   PROT 7.0 07/13/2012 2105   ALBUMIN 4.1 07/13/2012 2105   AST 30 07/13/2012 2105   ALT 8 07/13/2012 2105   ALKPHOS 69 07/13/2012 2105   BILITOT 0.4 07/13/2012 2105   GFRNONAA 52* 07/17/2012 1330   GFRAA 60* 07/17/2012 1330    Assessment and Plan  Chronic back pain Will increase methadone to 20 mg BID. Pt does have an addiction problem that is not well controlled yet. PT is very reliable in their assessments of need vs want and they believe this pt is not meeting PT  goals 2/2 pain.  IRRITABLE BOWEL SYNDROME  - description sounds like IB, pt is not on a lot of laxatives, nor have they been changed recently; pt agrees to try align, 1 po daily, a probiotic that has been shown to improve  sx of IB in some pt's  [name], MD

## 2013-07-10 ENCOUNTER — Non-Acute Institutional Stay (SKILLED_NURSING_FACILITY): Payer: Medicare Other | Admitting: Internal Medicine

## 2013-07-10 DIAGNOSIS — M62838 Other muscle spasm: Secondary | ICD-10-CM

## 2013-07-10 DIAGNOSIS — K5909 Other constipation: Secondary | ICD-10-CM

## 2013-07-10 DIAGNOSIS — K5900 Constipation, unspecified: Secondary | ICD-10-CM

## 2013-07-15 ENCOUNTER — Encounter: Payer: Self-pay | Admitting: Internal Medicine

## 2013-07-15 NOTE — Progress Notes (Signed)
MRN: [id number] Name: George Foster  Sex: male Age: 80 y.o. DOB: 08-30-1924  [place] #: [facility] Facility/Room: 126B Level Of Care: SNF Provider: [name] Emergency Contacts: Extended Emergency Contact Information Primary Emergency Contact: Trinda Pascal Address: [address]          [place], [place] [address] [place] Phone: [phone] Relation: Spouse    Allergies: Review of patient's allergies indicates no known allergies.  Chief Complaint  Patient presents with  . Medical Management of Chronic Issues    HPI: Patient is 80 y.o. male who rehab asks me to see because pt is complaining of his bowels again, mostly constipation. It's probably been loose stool around constipation the entire time.  Past Medical History  Diagnosis Date  . Dementia   . Hypertension   . Arthritis   . Hypothyroidism   . Chronic back pain   . Anxiety disorder   . Gastroesophageal reflux disease   . Polypharmacy     History of hospitalizations for altered mental status related to medications  . Chronic constipation     with fecal impactions, recurrent    Past Surgical History  Procedure Laterality Date  . Appendectomy    . Removal of nonrestorable teeth numbers 2, 4, 6, 12, 13, 18, 29        Medication List       This list is accurate as of: 07/10/13 11:59 PM.  Always use your most recent med list.               acetaminophen 325 MG tablet  Commonly known as:  TYLENOL  Take 650 mg by mouth every 12 (twelve) hours.     docusate sodium 100 MG capsule  Commonly known as:  COLACE  Take 100 mg by mouth 2 (two) times daily.     dutasteride 0.5 MG capsule  Commonly known as:  AVODART  Take 0.5 mg by mouth daily.     escitalopram 10 MG tablet  Commonly known as:  LEXAPRO  Take 10 mg by mouth daily.     EXELON 13.3 MG/24HR Pt24  Generic drug:  Rivastigmine  Place 4.6 patches onto the skin.     gabapentin 600 MG tablet  Commonly known as:   NEURONTIN  Take 600 mg by mouth 2 (two) times daily.     levothyroxine 100 MCG tablet  Commonly known as:  SYNTHROID, LEVOTHROID  Take 88 mcg by mouth daily.     LORazepam 0.5 MG tablet  Commonly known as:  ATIVAN  1 by mouth three times daily as needed     methadone 10 MG tablet  Commonly known as:  DOLOPHINE  Take 1 tablet (10 mg total) by mouth every 8 (eight) hours. As needed for pain     methadone 10 MG tablet  Commonly known as:  DOLOPHINE  Take one tablet by mouth at bedtime for pain     methadone 10 MG tablet  Commonly known as:  DOLOPHINE  Take two tablets by mouth every 12 hours for pain     multivitamins ther. w/minerals Tabs tablet  Take 1 tablet by mouth daily.     omeprazole 40 MG capsule  Commonly known as:  PRILOSEC  Take 40 mg by mouth daily.     oxyCODONE-acetaminophen 5-325 MG per tablet  Commonly known as:  PERCOCET/ROXICET  Take one tablet by mouth every 4 hours as needed for severe pain     tamsulosin 0.4 MG Caps  capsule  Commonly known as:  FLOMAX  Take 0.4 mg by mouth daily.        No orders of the defined types were placed in this encounter.     There is no immunization history on file for this patient.  History  Substance Use Topics  . Smoking status: Never Smoker   . Smokeless tobacco: Never Used  . Alcohol Use: No     Comment: Pt denies    Review of Systems  DATA OBTAINED: from patient, nurse, GENERAL: no fevers, fatigue, appetite changes SKIN: No itching, rash HEENT: No complaint RESPIRATORY: No cough, wheezing, SOB CARDIAC: No chest pain, palpitations, lower extremity edema  GI: No abdominal pain, No N/V/D or constipation, No heartburn or reflux  GU: No dysuria, frequency or urgency, or incontinence  MUSCULOSKELETAL: pt c/o neck spasms and nurse says he c/o them daily NEUROLOGIC: No headache, dizziness or focal weakness PSYCHIATRIC: No overt anxiety or sadness.   Filed Vitals:   07/15/13 1825  BP: 140/80  Pulse: 66   Temp: 97.9 F (36.6 C)  Resp: 20    Physical Exam  GENERAL APPEARANCE: Alert, conversant. Appropriately groomed  SKIN: No diaphoresis rash, or wounds HEENT: Unremarkable RESPIRATORY: Breathing is even, unlabored. Lung sounds are clear   CARDIOVASCULAR: Heart RRR no murmurs, rubs or gallops. No peripheral edema  GASTROINTESTINAL: Abdomen is soft, non-tender, not distended w/ normal bowel sounds.  GENITOURINARY: Bladder non tender, not distended  MUSCULOSKELETAL: pt is holding neck;some muscle spasm L neck NEUROLOGIC: Cranial nerves 2-12 grossly intact. Moves all extremities no tremor. PSYCHIATRIC: Mood and affect appropriate to situation, no behavioral issues  Patient Active Problem List   Diagnosis Date Noted  . BPH (benign prostatic hyperplasia) 02/17/2013  . Osteoarthritis cervical spine 02/17/2013  . Chronic constipation   . Chronic back pain 06/08/2011  . Toxic encephalopathy 06/06/2011  . E. coli urinary tract infection 06/06/2011  . Dementia with behavioral disturbance 06/06/2011  . Wounds, multiple 06/06/2011  . Suicidal ideation 06/06/2011  . Hypertension   . Hypothyroidism   . Gastroesophageal reflux disease   . Anxiety disorder   . Polypharmacy     CBC    Component Value Date/Time   WBC 5.0 07/17/2012 1330   RBC 3.72* 07/17/2012 1330   HGB 12.1* 07/17/2012 1330   HCT 46.8 07/17/2012 1330   PLT 156 07/17/2012 1330   MCV 125.8* 07/17/2012 1330   LYMPHSABS 1.0 01/21/2011 1655   MONOABS 0.4 01/21/2011 1655   EOSABS 0.2 01/21/2011 1655   BASOSABS 0.0 01/21/2011 1655    CMP     Component Value Date/Time   NA 139 07/17/2012 1330   K 4.3 07/17/2012 1330   CL 103 07/17/2012 1330   CO2 28 07/17/2012 1330   GLUCOSE 125* 07/17/2012 1330   BUN 30* 07/17/2012 1330   CREATININE 1.22 07/17/2012 1330   CALCIUM 8.8 07/17/2012 1330   PROT 7.0 07/13/2012 2105   ALBUMIN 4.1 07/13/2012 2105   AST 30 07/13/2012 2105   ALT 8 07/13/2012 2105   ALKPHOS 69 07/13/2012 2105   BILITOT 0.4  07/13/2012 2105   GFRNONAA 52* 07/17/2012 1330   GFRAA 60* 07/17/2012 1330    Assessment and Plan  Chronic constipation Pt has narcotic problem so constipation always a problem. He's on mirilax and colace BID. Will try linzess which is specifially for narcotic induced constipation.  MUSCLE SPASMS NECK WITH C/O PAIN- pt's med reviewed with his nurse; of all his pain meds he is not  on any muscle relaxers. Will start Robaxin 500 mg q6.  [name], MD

## 2013-07-15 NOTE — Assessment & Plan Note (Signed)
Pt has narcotic problem so constipation always a problem. He's on mirilax and colace BID. Will try linzess which is specifially for narcotic induced constipation.

## 2013-07-23 ENCOUNTER — Other Ambulatory Visit: Payer: Self-pay | Admitting: *Deleted

## 2013-07-23 MED ORDER — OXYCODONE-ACETAMINOPHEN 5-325 MG PO TABS: ORAL_TABLET | ORAL | Status: DC

## 2013-07-23 NOTE — Telephone Encounter (Signed)
[facility] [place] 

## 2013-07-24 ENCOUNTER — Encounter: Payer: Self-pay | Admitting: Internal Medicine

## 2013-07-24 ENCOUNTER — Non-Acute Institutional Stay (SKILLED_NURSING_FACILITY): Payer: Medicare Other | Admitting: Internal Medicine

## 2013-07-24 DIAGNOSIS — F419 Anxiety disorder, unspecified: Secondary | ICD-10-CM

## 2013-07-24 DIAGNOSIS — Z79899 Other long term (current) drug therapy: Secondary | ICD-10-CM

## 2013-07-24 DIAGNOSIS — F411 Generalized anxiety disorder: Secondary | ICD-10-CM

## 2013-07-24 NOTE — Assessment & Plan Note (Signed)
Will see if aleviaeting anxiety will cut down on pain med requests.

## 2013-07-24 NOTE — Progress Notes (Signed)
MRN: [id number] Name: George Foster  Sex: male Age: 80 y.o. DOB: 10/17/1924  [place] #: [facility] Facility/Room: 126A Level Of Care: SNF Provider: [name] Emergency Contacts: Extended Emergency Contact Information Primary Emergency Contact: Trinda Pascal Address: [address]          [place],  [address] [place] Phone: [phone] Relation: Spouse   Allergies: Review of patient's allergies indicates no known allergies.  Chief Complaint  Patient presents with  . Medical Management of Chronic Issues    HPI: Patient is 80 y.o. male who is being seen for pain concerns.  Past Medical History  Diagnosis Date  . Dementia   . Hypertension   . Arthritis   . Hypothyroidism   . Chronic back pain   . Anxiety disorder   . Gastroesophageal reflux disease   . Polypharmacy     History of hospitalizations for altered mental status related to medications  . Chronic constipation     with fecal impactions, recurrent    Past Surgical History  Procedure Laterality Date  . Appendectomy    . Removal of nonrestorable teeth numbers 2, 4, 6, 12, 13, 18, 29        Medication List       This list is accurate as of: 07/24/13  7:43 PM.  Always use your most recent med list.               acetaminophen 325 MG tablet  Commonly known as:  TYLENOL  Take 650 mg by mouth every 12 (twelve) hours.     docusate sodium 100 MG capsule  Commonly known as:  COLACE  Take 100 mg by mouth 2 (two) times daily.     dutasteride 0.5 MG capsule  Commonly known as:  AVODART  Take 0.5 mg by mouth daily.     escitalopram 10 MG tablet  Commonly known as:  LEXAPRO  Take 10 mg by mouth daily.     EXELON 13.3 MG/24HR Pt24  Generic drug:  Rivastigmine  Place 4.6 patches onto the skin.     gabapentin 600 MG tablet  Commonly known as:  NEURONTIN  Take 600 mg by mouth 2 (two) times daily.     levothyroxine 100 MCG tablet  Commonly known as:  SYNTHROID, LEVOTHROID   Take 88 mcg by mouth daily.     LORazepam 0.5 MG tablet  Commonly known as:  ATIVAN  1 by mouth three times daily as needed     methadone 10 MG tablet  Commonly known as:  DOLOPHINE  Take 1 tablet (10 mg total) by mouth every 8 (eight) hours. As needed for pain     methadone 10 MG tablet  Commonly known as:  DOLOPHINE  Take one tablet by mouth at bedtime for pain     methadone 10 MG tablet  Commonly known as:  DOLOPHINE  Take two tablets by mouth every 12 hours for pain     multivitamins ther. w/minerals Tabs tablet  Take 1 tablet by mouth daily.     omeprazole 40 MG capsule  Commonly known as:  PRILOSEC  Take 40 mg by mouth daily.     oxyCODONE-acetaminophen 5-325 MG per tablet  Commonly known as:  PERCOCET/ROXICET  Take one tablet by mouth every 4 hours as needed for severe pain     tamsulosin 0.4 MG Caps capsule  Commonly known as:  FLOMAX  Take 0.4 mg by mouth daily.  No orders of the defined types were placed in this encounter.     There is no immunization history on file for this patient.  History  Substance Use Topics  . Smoking status: Never Smoker   . Smokeless tobacco: Never Used  . Alcohol Use: No     Comment: Pt denies    Review of Systems  DATA OBTAINED: from patient, nurse GENERAL: Feels well no fevers, fatigue, appetite changes SKIN: No itching, rash HEENT: No complaint RESPIRATORY: No cough, wheezing, SOB CARDIAC: No chest pain, palpitations, lower extremity edema  GI: No abdominal pain, No N/V/D or constipation, No heartburn or reflux  GU: No dysuria, frequency or urgency, or incontinence  MUSCULOSKELETAL: post CP where he fell 17 days ago -it is killing him NEUROLOGIC: No headache, dizziness or focal weakness   Filed Vitals:   07/24/13 1933  BP: 121/91  Pulse: 86  Temp: 98.8 F (37.1 C)  Resp: 19    Physical Exam  GENERAL APPEARANCE: Alert, conversant. Appropriately groomed; pt exhibits no dsicomfort, certainly not  pain while his telling me how much pain he has  SKIN: No diaphoresis rash HEENT: Unremarkable RESPIRATORY: Breathing is even, unlabored. Lung sounds are clear   CARDIOVASCULAR: Heart RRR no murmurs, rubs or gallops. No peripheral edema  GASTROINTESTINAL: Abdomen is soft, non-tender, not distended w/ normal bowel sounds.  GENITOURINARY: Bladder non tender, not distended  MUSCULOSKELETAL: No abnormal joints or musculature NEUROLOGIC: Cranial nerves 2-12 grossly intact. Moves all extremities no tremor. PSYCHIATRIC: Mood and affect appropriate to situation, no behavioral issues  Patient Active Problem List   Diagnosis Date Noted  . BPH (benign prostatic hyperplasia) 02/17/2013  . Osteoarthritis cervical spine 02/17/2013  . Chronic constipation   . Chronic back pain 06/08/2011  . Toxic encephalopathy 06/06/2011  . E. coli urinary tract infection 06/06/2011  . Dementia with behavioral disturbance 06/06/2011  . Wounds, multiple 06/06/2011  . Suicidal ideation 06/06/2011  . Hypertension   . Hypothyroidism   . Gastroesophageal reflux disease   . Anxiety disorder   . Polypharmacy     CBC    Component Value Date/Time   WBC 5.0 07/17/2012 1330   RBC 3.72* 07/17/2012 1330   HGB 12.1* 07/17/2012 1330   HCT 46.8 07/17/2012 1330   PLT 156 07/17/2012 1330   MCV 125.8* 07/17/2012 1330   LYMPHSABS 1.0 01/21/2011 1655   MONOABS 0.4 01/21/2011 1655   EOSABS 0.2 01/21/2011 1655   BASOSABS 0.0 01/21/2011 1655    CMP     Component Value Date/Time   NA 139 07/17/2012 1330   K 4.3 07/17/2012 1330   CL 103 07/17/2012 1330   CO2 28 07/17/2012 1330   GLUCOSE 125* 07/17/2012 1330   BUN 30* 07/17/2012 1330   CREATININE 1.22 07/17/2012 1330   CALCIUM 8.8 07/17/2012 1330   PROT 7.0 07/13/2012 2105   ALBUMIN 4.1 07/13/2012 2105   AST 30 07/13/2012 2105   ALT 8 07/13/2012 2105   ALKPHOS 69 07/13/2012 2105   BILITOT 0.4 07/13/2012 2105   GFRNONAA 52* 07/17/2012 1330   GFRAA 60* 07/17/2012 1330    Assessment and  Plan  Polypharmacy It is now clear that pt has a chronic drug problem. The more drug he gets the more he c/o pain .Bad behavoir is being rewarded.All I did in increasing his methadone is upload his receptors. I have decreased him from methadone 20 mg BIDto 15 mg BID and will decrease him back to his 10 mg BID in another  month. Pt still has percocet prn which he predictably says doesn't work. He is clearly not in pain. He is to old to change. The robaxin makes him sleepy so will give it only at night.On request of his very competent nurse we will try Ativan 0.5 mg BID.    [name], MD

## 2013-07-24 NOTE — Assessment & Plan Note (Addendum)
It is now clear that pt has a chronic drug problem. The more drug he gets the more he c/o pain .Bad behavoir is being rewarded.All I did in increasing his methadone is upload his receptors. I have decreased him from methadone 20 mg BIDto 15 mg BID and will decrease him back to his 10 mg BID in another month. Pt still has percocet prn which he predictably says doesn't work. He is clearly not in pain. He is too old to change. The robaxin makes him sleepy so will give it only at night.On request of his very competent nurse we will try Ativan 0.5 mg BID.

## 2013-08-03 ENCOUNTER — Encounter: Payer: Self-pay | Admitting: Adult Health

## 2013-09-05 ENCOUNTER — Encounter: Payer: Self-pay | Admitting: Internal Medicine

## 2013-09-05 ENCOUNTER — Non-Acute Institutional Stay (SKILLED_NURSING_FACILITY): Payer: Medicare Other | Admitting: Internal Medicine

## 2013-09-05 DIAGNOSIS — F1920 Other psychoactive substance dependence, uncomplicated: Secondary | ICD-10-CM

## 2013-09-05 DIAGNOSIS — M549 Dorsalgia, unspecified: Secondary | ICD-10-CM

## 2013-09-05 DIAGNOSIS — G8929 Other chronic pain: Secondary | ICD-10-CM

## 2013-09-05 DIAGNOSIS — F03918 Unspecified dementia, unspecified severity, with other behavioral disturbance: Secondary | ICD-10-CM

## 2013-09-05 DIAGNOSIS — M62838 Other muscle spasm: Secondary | ICD-10-CM

## 2013-09-05 DIAGNOSIS — M47812 Spondylosis without myelopathy or radiculopathy, cervical region: Secondary | ICD-10-CM

## 2013-09-05 DIAGNOSIS — F0391 Unspecified dementia with behavioral disturbance: Secondary | ICD-10-CM

## 2013-09-05 DIAGNOSIS — K589 Irritable bowel syndrome without diarrhea: Secondary | ICD-10-CM

## 2013-09-05 NOTE — Progress Notes (Signed)
Patient ID: George Foster, male   DOB: [birth year], 80 y.o.   MRN: [id number]  Location:  [facility] Ezequiel Macauley L. Mariea Clonts, D.O., C.M.D.  PCP: Hollace Kinnier, DO  Code Status: full code  No Known Allergies  Chief Complaint  Patient presents with  . Discharge Note    d/c to assisted living facility    HPI:  80 yo white male who has been here long term with h/o dementia, episodic mood disorder, chronic pain of his neck and back, BPH, insomnia and chronic opioid induced constipation was seen prior to d/c to an assisted living facility.  He is oriented intermittently, ambulatory, continent of bowel and bladder.  He communicates his needs verbally.  He eats a mechanical soft regular diet.    Review of Systems:  Review of Systems  Constitutional: Negative for malaise/fatigue.  HENT: Negative for congestion and hearing loss.   Eyes: Negative for blurred vision.  Respiratory: Negative for shortness of breath.   Cardiovascular: Negative for chest pain.  Gastrointestinal: Negative for constipation.  Genitourinary: Negative for dysuria.  Musculoskeletal: Positive for myalgias, back pain and neck pain. Negative for falls.  Skin: Negative for rash.  Neurological: Negative for dizziness, weakness and headaches.  Endo/Heme/Allergies: Bruises/bleeds easily.  Psychiatric/Behavioral: Positive for depression, hallucinations and memory loss. The patient is nervous/anxious and has insomnia.      Past Medical History  Diagnosis Date  . Dementia   . Hypertension   . Arthritis   . Hypothyroidism   . Chronic back pain   . Anxiety disorder   . Gastroesophageal reflux disease   . Polypharmacy     History of hospitalizations for altered mental status related to medications  . Chronic constipation     with fecal impactions, recurrent    Past Surgical History  Procedure Laterality Date  . Appendectomy    . Removal of nonrestorable teeth numbers 2, 4, 6, 12, 13, 18, 29      Social  History:   reports that he has never smoked. He has never used smokeless tobacco. He reports that he does not drink alcohol or use illicit drugs.  Family History  Problem Relation Age of Onset  . Heart disease Father     Medications: Patient's Medications  New Prescriptions   No medications on file  Previous Medications   ACETAMINOPHEN (TYLENOL) 325 MG TABLET    Take 650 mg by mouth every 12 (twelve) hours.   DIVALPROEX (DEPAKOTE SPRINKLE) 125 MG CAPSULE    Take 500 mg by mouth at bedtime.   DIVALPROEX (DEPAKOTE) 500 MG DR TABLET    Take 1 tablet (500 mg total) by mouth every 12 (twelve) hours.   DOCUSATE SODIUM (COLACE) 100 MG CAPSULE    Take 100 mg by mouth 2 (two) times daily.   DUTASTERIDE (AVODART) 0.5 MG CAPSULE    Take 0.5 mg by mouth daily.   ESCITALOPRAM (LEXAPRO) 10 MG TABLET    Take 10 mg by mouth daily.   GABAPENTIN (NEURONTIN) 600 MG TABLET    Take 600 mg by mouth 2 (two) times daily.    LEVOTHYROXINE (SYNTHROID, LEVOTHROID) 100 MCG TABLET    Take 88 mcg by mouth daily.    LINACLOTIDE (LINZESS) 290 MCG CAPS CAPSULE    Take 290 mcg by mouth daily.   LORAZEPAM (ATIVAN) 0.5 MG TABLET    1 by mouth three times daily as needed   METHADONE (DOLOPHINE) 10 MG TABLET    Take 15 mg by mouth  every 12 (twelve) hours.   MULTIPLE VITAMINS-MINERALS (MULTIVITAMINS THER. W/MINERALS) TABS    Take 1 tablet by mouth daily.   OMEPRAZOLE (PRILOSEC) 40 MG CAPSULE    Take 40 mg by mouth daily.   OXYCODONE-ACETAMINOPHEN (PERCOCET/ROXICET) 5-325 MG PER TABLET    Take one tablet by mouth every 4 hours as needed for severe pain   PROBIOTIC PRODUCT (ALIGN) 4 MG CAPS    Take 4 mg by mouth daily.   RIVASTIGMINE (EXELON) 13.3 MG/24HR PT24    Place 4.6 patches onto the skin.    TAMSULOSIN (FLOMAX) 0.4 MG CAPS    Take 0.4 mg by mouth daily.  Modified Medications   No medications on file  Discontinued Medications   METHADONE (DOLOPHINE) 10 MG TABLET    Take 1 tablet (10 mg total) by mouth every 8 (eight)  hours. As needed for pain   METHADONE (DOLOPHINE) 10 MG TABLET    Take one tablet by mouth at bedtime for pain   METHADONE (DOLOPHINE) 10 MG TABLET    Take two tablets by mouth every 12 hours for pain    Physical Exam: Filed Vitals:   09/05/13 1053  BP: 117/85  Pulse: 77  Temp: 98.7 F (37.1 C)  Resp: 20  Height: 5\' 10"  (1.778 m)  Weight: 206 lb (93.441 kg)  SpO2: 97%  Physical Exam  Constitutional: He appears well-developed and well-nourished. No distress.  Cardiovascular: Normal rate, regular rhythm, normal heart sounds and intact distal pulses.   Pulmonary/Chest: Effort normal and breath sounds normal. No respiratory distress.  Abdominal: Soft. Bowel sounds are normal. He exhibits no distension and no mass. There is no tenderness.  Musculoskeletal: Normal range of motion. He exhibits tenderness.  Cervical paravertebral muscles and trapezius, lower back  Neurological: He is alert.  Skin: Skin is warm and dry.    Assessment/Plan:   1. Muscle spasms of neck -longstanding related to his degenerative disc disease of his neck and OA -has been on methadone, percocet longstanding and typically requests med adjustments every visit -has previously abused his pain meds  2. Chronic back pain -as above  3. Osteoarthritis cervical spine -has had imaging done consistent with this and DDD  4. Irritable bowel syndrome -predominantly constipation and likely a result of all of his narcotics and psych meds plus bowel regimen use -on linzess at present  5. Dementia with behavioral disturbance -cont exelon patch and depakote for mood stablization -has required [place] admissions in the past   6. Dependency on pain medication -again, this has been longstanding for him and he has had complications from polypharmacy so doses should only be tweaked slightly   Patient is being discharged with home health services:  None--going to assisted living.  FL-2 completed.    Patient is being  discharged with the following durable medical equipment:  None indicated at time this was completed.  Patient has been advised to f/u with their PCP in 1-2 weeks to bring them up to date on their rehab stay.  They were provided with a 30 day supply of scripts for prescription medications and refills must be obtained from their PCP.    Labs/tests ordered:  Will need depakote level monitored at AL

## 2013-09-26 ENCOUNTER — Encounter (HOSPITAL_COMMUNITY): Payer: Self-pay | Admitting: Emergency Medicine

## 2013-09-26 ENCOUNTER — Emergency Department (HOSPITAL_COMMUNITY): Payer: Medicare Other

## 2013-09-26 ENCOUNTER — Emergency Department (HOSPITAL_COMMUNITY)
Admission: EM | Admit: 2013-09-26 | Discharge: 2013-09-27 | Disposition: A | Discharge status: Skilled Nursing Facility | Payer: Medicare Other | Attending: Emergency Medicine | Admitting: Emergency Medicine

## 2013-09-26 DIAGNOSIS — F0390 Unspecified dementia without behavioral disturbance: Secondary | ICD-10-CM | POA: Diagnosis not present

## 2013-09-26 DIAGNOSIS — S0990XA Unspecified injury of head, initial encounter: Secondary | ICD-10-CM | POA: Diagnosis present

## 2013-09-26 DIAGNOSIS — R296 Repeated falls: Secondary | ICD-10-CM | POA: Diagnosis not present

## 2013-09-26 DIAGNOSIS — Y92009 Unspecified place in unspecified non-institutional (private) residence as the place of occurrence of the external cause: Secondary | ICD-10-CM | POA: Diagnosis not present

## 2013-09-26 DIAGNOSIS — Z79899 Other long term (current) drug therapy: Secondary | ICD-10-CM | POA: Insufficient documentation

## 2013-09-26 DIAGNOSIS — G8929 Other chronic pain: Secondary | ICD-10-CM | POA: Diagnosis not present

## 2013-09-26 DIAGNOSIS — Z23 Encounter for immunization: Secondary | ICD-10-CM | POA: Diagnosis not present

## 2013-09-26 DIAGNOSIS — E039 Hypothyroidism, unspecified: Secondary | ICD-10-CM | POA: Insufficient documentation

## 2013-09-26 DIAGNOSIS — IMO0002 Reserved for concepts with insufficient information to code with codable children: Secondary | ICD-10-CM | POA: Diagnosis not present

## 2013-09-26 DIAGNOSIS — Y9389 Activity, other specified: Secondary | ICD-10-CM | POA: Diagnosis not present

## 2013-09-26 DIAGNOSIS — I10 Essential (primary) hypertension: Secondary | ICD-10-CM | POA: Diagnosis not present

## 2013-09-26 DIAGNOSIS — M129 Arthropathy, unspecified: Secondary | ICD-10-CM | POA: Insufficient documentation

## 2013-09-26 DIAGNOSIS — K219 Gastro-esophageal reflux disease without esophagitis: Secondary | ICD-10-CM | POA: Insufficient documentation

## 2013-09-26 DIAGNOSIS — W19XXXA Unspecified fall, initial encounter: Secondary | ICD-10-CM

## 2013-09-26 DIAGNOSIS — Z792 Long term (current) use of antibiotics: Secondary | ICD-10-CM | POA: Insufficient documentation

## 2013-09-26 DIAGNOSIS — F411 Generalized anxiety disorder: Secondary | ICD-10-CM | POA: Insufficient documentation

## 2013-09-26 DIAGNOSIS — Y92129 Unspecified place in nursing home as the place of occurrence of the external cause: Secondary | ICD-10-CM

## 2013-09-26 DIAGNOSIS — K5900 Constipation, unspecified: Secondary | ICD-10-CM | POA: Diagnosis not present

## 2013-09-26 MED ORDER — TETANUS-DIPHTH-ACELL PERTUSSIS 5-2.5-18.5 LF-MCG/0.5 IM SUSP
0.5000 mL | Freq: Once | INTRAMUSCULAR | Status: AC
  Administered 2013-09-27: 0.5 mL via INTRAMUSCULAR
  Filled 2013-09-26: qty 0.5

## 2013-09-26 NOTE — ED Notes (Signed)
Patient transported to CT 

## 2013-09-26 NOTE — ED Notes (Signed)
Bed: WA10 Expected date: 09/26/13 Expected time: 10:28 PM Means of arrival: Ambulance Comments: Fall  80 yo M

## 2013-09-26 NOTE — ED Notes (Signed)
Patient fell at skilled nursing facility while trying to get out of bed. Possibly hit his head.

## 2013-09-26 NOTE — ED Provider Notes (Signed)
CSN: [id number]     Arrival date & time 09/26/13  2250 History   First MD Initiated Contact with Patient 09/26/13 2321     Chief Complaint  Patient presents with  . Fall  . Ear Laceration     (Consider location/radiation/quality/duration/timing/severity/associated sxs/prior Treatment) HPI Comments:  Pt presents with h/a and abrasion behind R ear after a fall OOB at his nursing facility tonight. He states he has a hx of neuropathy and felt like someone was pulling his legs up in the air. He thinks he rolled OOB. This has happened 5 or 6 other times in the past. No anticoagulant use.  No LOC.  Denies n/v, numbness, weakness.  Patient is a 80 y.o. male presenting with fall. The history is provided by the patient. No language interpreter was used.  Fall This is a recurrent problem. The current episode started 1 to 2 hours ago. The problem occurs rarely. The problem has been resolved. Associated symptoms include headaches. Pertinent negatives include no chest pain, no abdominal pain and no shortness of breath. Nothing aggravates the symptoms. Nothing relieves the symptoms. He has tried nothing for the symptoms. The treatment provided no relief.    Past Medical History  Diagnosis Date  . Dementia   . Hypertension   . Arthritis   . Hypothyroidism   . Chronic back pain   . Anxiety disorder   . Gastroesophageal reflux disease   . Polypharmacy     History of hospitalizations for altered mental status related to medications  . Chronic constipation     with fecal impactions, recurrent   Past Surgical History  Procedure Laterality Date  . Appendectomy    . Removal of nonrestorable teeth numbers 2, 4, 6, 12, 13, 18, 29     Family History  Problem Relation Age of Onset  . Heart disease Father    History  Substance Use Topics  . Smoking status: Never Smoker   . Smokeless tobacco: Never Used  . Alcohol Use: No     Comment: Pt denies    Review of Systems  Constitutional: Negative for  fever, activity change, appetite change and fatigue.  HENT: Negative for congestion, facial swelling, rhinorrhea and trouble swallowing.   Eyes: Negative for photophobia and pain.  Respiratory: Negative for cough, chest tightness and shortness of breath.   Cardiovascular: Negative for chest pain and leg swelling.  Gastrointestinal: Negative for nausea, vomiting, abdominal pain, diarrhea and constipation.  Endocrine: Negative for polydipsia and polyuria.  Genitourinary: Negative for dysuria, urgency, decreased urine volume and difficulty urinating.  Musculoskeletal: Negative for back pain and gait problem.  Skin: Negative for color change, rash and wound.  Allergic/Immunologic: Negative for immunocompromised state.  Neurological: Positive for headaches. Negative for dizziness, facial asymmetry, speech difficulty, weakness and numbness.  Psychiatric/Behavioral: Negative for confusion, decreased concentration and agitation.      Allergies  Review of patient's allergies indicates no known allergies.  Home Medications   Prior to Admission medications   Medication Sig Start Date End Date Taking? Authorizing Provider  divalproex (DEPAKOTE SPRINKLE) 125 MG capsule Take 500 mg by mouth at bedtime.   Yes Historical Provider, MD  docusate sodium (COLACE) 100 MG capsule Take 100 mg by mouth 2 (two) times daily.   Yes Historical Provider, MD  dutasteride (AVODART) 0.5 MG capsule Take 0.5 mg by mouth every morning.    Yes Historical Provider, MD  escitalopram (LEXAPRO) 10 MG tablet Take 10 mg by mouth at bedtime.  Yes Historical Provider, MD  eszopiclone (LUNESTA) 1 MG TABS tablet Take 1 mg by mouth at bedtime as needed for sleep. Take immediately before bedtime   Yes Historical Provider, MD  gabapentin (NEURONTIN) 600 MG tablet Take 600 mg by mouth 2 (two) times daily.    Yes Historical Provider, MD  levothyroxine (SYNTHROID, LEVOTHROID) 88 MCG tablet Take 88 mcg by mouth at bedtime.   Yes  Historical Provider, MD  Linaclotide (LINZESS) 290 MCG CAPS capsule Take 290 mcg by mouth every morning.    Yes Historical Provider, MD  LORazepam (ATIVAN) 0.5 MG tablet Take 0.5 mg by mouth 2 (two) times daily.   Yes Historical Provider, MD  methadone (DOLOPHINE) 10 MG tablet Take 15 mg by mouth every 12 (twelve) hours.   Yes Historical Provider, MD  methocarbamol (ROBAXIN) 500 MG tablet Take 500 mg by mouth at bedtime.   Yes Historical Provider, MD  Multiple Vitamins-Minerals (MULTIVITAMINS THER. W/MINERALS) TABS Take 1 tablet by mouth daily.   Yes Historical Provider, MD  omeprazole (PRILOSEC) 40 MG capsule Take 40 mg by mouth daily.   Yes Historical Provider, MD  oxyCODONE-acetaminophen (PERCOCET/ROXICET) 5-325 MG per tablet Take one tablet by mouth every 4 hours as needed for severe pain 07/23/13  Yes Tiffany L Reed, DO  Probiotic Product (ALIGN) 4 MG CAPS Take 4 mg by mouth daily.   Yes Historical Provider, MD  Rivastigmine (EXELON) 13.3 MG/24HR PT24 Place 4.6 patches onto the skin.    Yes Historical Provider, MD  tamsulosin (FLOMAX) 0.4 MG CAPS Take 0.4 mg by mouth at bedtime.    Yes Historical Provider, MD  acetaminophen (TYLENOL) 325 MG tablet Take 650 mg by mouth every 12 (twelve) hours.    Historical Provider, MD  divalproex (DEPAKOTE) 500 MG DR tablet Take 1 tablet (500 mg total) by mouth every 12 (twelve) hours. 06/11/11 06/10/12  Theodis Blaze, MD  guaifenesin (ROBITUSSIN) 100 MG/5ML syrup Take 200 mg by mouth every 4 (four) hours as needed for cough.    Historical Provider, MD  LORazepam (ATIVAN) 0.5 MG tablet Take 0.5 mg by mouth every 8 (eight) hours as needed for anxiety.    Historical Provider, MD  polyethylene glycol (MIRALAX / GLYCOLAX) packet Take 17 g by mouth daily as needed for mild constipation (hold for diarrhea).    Historical Provider, MD   BP 120/64  Pulse 57  Temp(Src) 97.8 F (36.6 C) (Oral)  Resp 18  SpO2 94% Physical Exam  Constitutional: He is oriented to  person, place, and time. He appears well-developed and well-nourished. No distress.  HENT:  Head: Normocephalic and atraumatic.    Mouth/Throat: No oropharyngeal exudate.  Eyes: Pupils are equal, round, and reactive to light.  Neck: Normal range of motion. Neck supple.  Cardiovascular: Normal rate, regular rhythm and normal heart sounds.  Exam reveals no gallop and no friction rub.   No murmur heard. Pulmonary/Chest: Effort normal and breath sounds normal. No respiratory distress. He has no wheezes. He has no rales.  Abdominal: Soft. Bowel sounds are normal. He exhibits no distension and no mass. There is no tenderness. There is no rebound and no guarding.  Musculoskeletal: Normal range of motion. He exhibits no edema and no tenderness.  Neurological: He is alert and oriented to person, place, and time.  Skin: Skin is warm and dry.  Psychiatric: He has a normal mood and affect.    ED Course  Procedures (including critical care time) Labs Review Labs Reviewed - No  data to display  Imaging Review Ct Head Wo Contrast  09/27/2013   CLINICAL DATA:  Fall with complaint of headache, dizziness, and right-sided neck pain.  EXAM: CT HEAD WITHOUT CONTRAST  CT CERVICAL SPINE WITHOUT CONTRAST  TECHNIQUE: Multidetector CT imaging of the head and cervical spine was performed following the standard protocol without intravenous contrast. Multiplanar CT image reconstructions of the cervical spine were also generated.  COMPARISON:  CT head 07/13/2012. CT head and cervical spine 06/06/2011  FINDINGS: CT HEAD FINDINGS  Diffuse cerebral atrophy. Mild ventricular dilatation consistent with central atrophy. Low-attenuation changes in the deep white matter consistent with small vessel ischemia. No mass effect or midline shift. No abnormal extra-axial fluid collections. Gray-white matter junctions are distinct. Basal cisterns are not effaced. No evidence of acute intracranial hemorrhage. No depressed skull fractures.  Mild mucosal thickening in the paranasal sinuses. Vascular calcifications.  CT CERVICAL SPINE FINDINGS  Diffuse degenerative changes throughout the cervical spine with narrowed cervical interspaces and associated endplate hypertrophic changes. Prominent disc osteophyte complexes at C4-5, C5-6, and C6-7 levels. Degenerative changes in the cervical facet joints. C1-2 articulation appears intact. No vertebral compression deformities. No prevertebral soft tissue swelling. No focal bone lesion or bone destruction. Bone cortex and trabecular architecture appear intact.  IMPRESSION: No acute intracranial abnormalities. Chronic atrophy and small vessel ischemic changes.  Degenerative changes throughout the cervical spine. Normal alignment. No displaced fractures identified.   Electronically Signed   By: Lucienne Capers M.D.   On: 09/27/2013 00:36   Ct Cervical Spine Wo Contrast  09/27/2013   CLINICAL DATA:  Fall with complaint of headache, dizziness, and right-sided neck pain.  EXAM: CT HEAD WITHOUT CONTRAST  CT CERVICAL SPINE WITHOUT CONTRAST  TECHNIQUE: Multidetector CT imaging of the head and cervical spine was performed following the standard protocol without intravenous contrast. Multiplanar CT image reconstructions of the cervical spine were also generated.  COMPARISON:  CT head 07/13/2012. CT head and cervical spine 06/06/2011  FINDINGS: CT HEAD FINDINGS  Diffuse cerebral atrophy. Mild ventricular dilatation consistent with central atrophy. Low-attenuation changes in the deep white matter consistent with small vessel ischemia. No mass effect or midline shift. No abnormal extra-axial fluid collections. Gray-white matter junctions are distinct. Basal cisterns are not effaced. No evidence of acute intracranial hemorrhage. No depressed skull fractures. Mild mucosal thickening in the paranasal sinuses. Vascular calcifications.  CT CERVICAL SPINE FINDINGS  Diffuse degenerative changes throughout the cervical spine with  narrowed cervical interspaces and associated endplate hypertrophic changes. Prominent disc osteophyte complexes at C4-5, C5-6, and C6-7 levels. Degenerative changes in the cervical facet joints. C1-2 articulation appears intact. No vertebral compression deformities. No prevertebral soft tissue swelling. No focal bone lesion or bone destruction. Bone cortex and trabecular architecture appear intact.  IMPRESSION: No acute intracranial abnormalities. Chronic atrophy and small vessel ischemic changes.  Degenerative changes throughout the cervical spine. Normal alignment. No displaced fractures identified.   Electronically Signed   By: Lucienne Capers M.D.   On: 09/27/2013 00:36     EKG Interpretation None      MDM   Final diagnoses:  Fall at nursing home, initial encounter  Closed head injury without loss of consciousness, initial encounter    Pt is a 80 y.o. male with Pmhx as above who presents with h/a and abrasion behind R ear after a fall OOB at his nursing facility tonight. He states he has a hx of neuropathy and felt like someone was pulling his legs up in the air.  He thinks he rolled OOB. This has happened 5 or 6 other times in the past. No anticoagulant use.  No LOC.  Denies n/v, numbness, weakness. On PE, VSS, pt in NAD.  No focal neuro findings. CT head, CT c-spine w/o acute changes. Tdap updated.  Pt ambulated inhall w/o difficulty. Will d/c back to facility. Return precautions given for new or worsening symptoms including AMS, focal neuro complaints.          Neta Ehlers, MD 09/27/13 [phone]

## 2013-09-27 DIAGNOSIS — S0990XA Unspecified injury of head, initial encounter: Secondary | ICD-10-CM | POA: Diagnosis not present

## 2013-09-27 MED ORDER — ACETAMINOPHEN 325 MG PO TABS: 650.0000 mg | ORAL_TABLET | Freq: Once | ORAL | Status: DC

## 2013-09-27 NOTE — ED Notes (Signed)
[facility] called for transport.  

## 2013-09-27 NOTE — Discharge Instructions (Signed)

## 2013-09-28 DIAGNOSIS — G608 Other hereditary and idiopathic neuropathies: Secondary | ICD-10-CM

## 2013-09-28 DIAGNOSIS — G1111 Friedreich ataxia: Secondary | ICD-10-CM

## 2013-09-28 DIAGNOSIS — G111 Early-onset cerebellar ataxia: Secondary | ICD-10-CM

## 2013-09-28 DIAGNOSIS — F0393 Unspecified dementia, unspecified severity, with mood disturbance: Secondary | ICD-10-CM

## 2013-09-28 DIAGNOSIS — F0390 Unspecified dementia without behavioral disturbance: Secondary | ICD-10-CM

## 2013-09-28 DIAGNOSIS — M4712 Other spondylosis with myelopathy, cervical region: Secondary | ICD-10-CM

## 2013-10-01 ENCOUNTER — Inpatient Hospital Stay (HOSPITAL_COMMUNITY)
Admission: EM | Admit: 2013-10-01 | Discharge: 2013-10-06 | DRG: 086 | Disposition: A | Discharge status: Skilled Nursing Facility | Payer: Medicare Other | Attending: General Surgery | Admitting: General Surgery

## 2013-10-01 ENCOUNTER — Emergency Department (HOSPITAL_COMMUNITY): Payer: Medicare Other

## 2013-10-01 ENCOUNTER — Inpatient Hospital Stay (HOSPITAL_COMMUNITY): Payer: Medicare Other

## 2013-10-01 ENCOUNTER — Encounter (HOSPITAL_COMMUNITY): Payer: Self-pay | Admitting: Emergency Medicine

## 2013-10-01 DIAGNOSIS — M129 Arthropathy, unspecified: Secondary | ICD-10-CM | POA: Diagnosis present

## 2013-10-01 DIAGNOSIS — S065XAA Traumatic subdural hemorrhage with loss of consciousness status unknown, initial encounter: Secondary | ICD-10-CM

## 2013-10-01 DIAGNOSIS — S02400A Malar fracture unspecified, initial encounter for closed fracture: Secondary | ICD-10-CM | POA: Diagnosis present

## 2013-10-01 DIAGNOSIS — Z9181 History of falling: Secondary | ICD-10-CM | POA: Diagnosis not present

## 2013-10-01 DIAGNOSIS — G8929 Other chronic pain: Secondary | ICD-10-CM | POA: Diagnosis present

## 2013-10-01 DIAGNOSIS — I10 Essential (primary) hypertension: Secondary | ICD-10-CM | POA: Diagnosis present

## 2013-10-01 DIAGNOSIS — K219 Gastro-esophageal reflux disease without esophagitis: Secondary | ICD-10-CM | POA: Diagnosis present

## 2013-10-01 DIAGNOSIS — Y9210 Unspecified residential institution as the place of occurrence of the external cause: Secondary | ICD-10-CM | POA: Diagnosis present

## 2013-10-01 DIAGNOSIS — S064XAA Epidural hemorrhage with loss of consciousness status unknown, initial encounter: Secondary | ICD-10-CM | POA: Diagnosis present

## 2013-10-01 DIAGNOSIS — K5900 Constipation, unspecified: Secondary | ICD-10-CM | POA: Diagnosis present

## 2013-10-01 DIAGNOSIS — E039 Hypothyroidism, unspecified: Secondary | ICD-10-CM | POA: Diagnosis present

## 2013-10-01 DIAGNOSIS — S0280XA Fracture of other specified skull and facial bones, unspecified side, initial encounter for closed fracture: Secondary | ICD-10-CM | POA: Diagnosis present

## 2013-10-01 DIAGNOSIS — F411 Generalized anxiety disorder: Secondary | ICD-10-CM | POA: Diagnosis present

## 2013-10-01 DIAGNOSIS — F0390 Unspecified dementia without behavioral disturbance: Secondary | ICD-10-CM | POA: Diagnosis present

## 2013-10-01 DIAGNOSIS — W06XXXA Fall from bed, initial encounter: Secondary | ICD-10-CM | POA: Diagnosis present

## 2013-10-01 DIAGNOSIS — Y92129 Unspecified place in nursing home as the place of occurrence of the external cause: Secondary | ICD-10-CM

## 2013-10-01 DIAGNOSIS — S0180XA Unspecified open wound of other part of head, initial encounter: Secondary | ICD-10-CM | POA: Diagnosis present

## 2013-10-01 DIAGNOSIS — S02401A Maxillary fracture, unspecified, initial encounter for closed fracture: Secondary | ICD-10-CM

## 2013-10-01 DIAGNOSIS — H1130 Conjunctival hemorrhage, unspecified eye: Secondary | ICD-10-CM | POA: Diagnosis present

## 2013-10-01 DIAGNOSIS — S065X9A Traumatic subdural hemorrhage with loss of consciousness of unspecified duration, initial encounter: Secondary | ICD-10-CM

## 2013-10-01 DIAGNOSIS — S06309A Unspecified focal traumatic brain injury with loss of consciousness of unspecified duration, initial encounter: Principal | ICD-10-CM

## 2013-10-01 DIAGNOSIS — K429 Umbilical hernia without obstruction or gangrene: Secondary | ICD-10-CM | POA: Diagnosis present

## 2013-10-01 DIAGNOSIS — S0230XA Fracture of orbital floor, unspecified side, initial encounter for closed fracture: Secondary | ICD-10-CM

## 2013-10-01 DIAGNOSIS — R410 Disorientation, unspecified: Secondary | ICD-10-CM | POA: Diagnosis not present

## 2013-10-01 DIAGNOSIS — W19XXXA Unspecified fall, initial encounter: Secondary | ICD-10-CM

## 2013-10-01 DIAGNOSIS — M549 Dorsalgia, unspecified: Secondary | ICD-10-CM | POA: Diagnosis present

## 2013-10-01 DIAGNOSIS — S0210XA Unspecified fracture of base of skull, initial encounter for closed fracture: Secondary | ICD-10-CM

## 2013-10-01 DIAGNOSIS — S066XAA Traumatic subarachnoid hemorrhage with loss of consciousness status unknown, initial encounter: Principal | ICD-10-CM | POA: Diagnosis present

## 2013-10-01 DIAGNOSIS — Z79899 Other long term (current) drug therapy: Secondary | ICD-10-CM | POA: Diagnosis not present

## 2013-10-01 DIAGNOSIS — S0181XA Laceration without foreign body of other part of head, initial encounter: Secondary | ICD-10-CM

## 2013-10-01 DIAGNOSIS — D62 Acute posthemorrhagic anemia: Secondary | ICD-10-CM | POA: Diagnosis present

## 2013-10-01 DIAGNOSIS — N390 Urinary tract infection, site not specified: Secondary | ICD-10-CM | POA: Diagnosis not present

## 2013-10-01 DIAGNOSIS — S0292XA Unspecified fracture of facial bones, initial encounter for closed fracture: Secondary | ICD-10-CM

## 2013-10-01 DIAGNOSIS — S02109A Fracture of base of skull, unspecified side, initial encounter for closed fracture: Secondary | ICD-10-CM

## 2013-10-01 DIAGNOSIS — S01119A Laceration without foreign body of unspecified eyelid and periocular area, initial encounter: Secondary | ICD-10-CM | POA: Diagnosis present

## 2013-10-01 DIAGNOSIS — I6200 Nontraumatic subdural hemorrhage, unspecified: Secondary | ICD-10-CM | POA: Diagnosis present

## 2013-10-01 HISTORY — DX: Traumatic subdural hemorrhage with loss of consciousness of unspecified duration, initial encounter: S06.5X9A

## 2013-10-01 HISTORY — DX: Traumatic subdural hemorrhage with loss of consciousness status unknown, initial encounter: S06.5XAA

## 2013-10-01 LAB — BASIC METABOLIC PANEL WITH GFR
Anion gap: 13 (ref 5–15)
BUN: 26 mg/dL — ABNORMAL HIGH (ref 6–23)
CO2: 26 meq/L (ref 19–32)
Calcium: 8.7 mg/dL (ref 8.4–10.5)
Chloride: 101 meq/L (ref 96–112)
Creatinine, Ser: 1.16 mg/dL (ref 0.50–1.35)
GFR calc Af Amer: 63 mL/min — ABNORMAL LOW
GFR calc non Af Amer: 55 mL/min — ABNORMAL LOW
Glucose, Bld: 156 mg/dL — ABNORMAL HIGH (ref 70–99)
Potassium: 4.2 meq/L (ref 3.7–5.3)
Sodium: 140 meq/L (ref 137–147)

## 2013-10-01 LAB — CBC WITH DIFFERENTIAL/PLATELET
Basophils Absolute: 0 10*3/uL (ref 0.0–0.1)
Basophils Relative: 0 % (ref 0–1)
Eosinophils Absolute: 0.1 10*3/uL (ref 0.0–0.7)
Eosinophils Relative: 1 % (ref 0–5)
HCT: 33.6 % — ABNORMAL LOW (ref 39.0–52.0)
Hemoglobin: 11.2 g/dL — ABNORMAL LOW (ref 13.0–17.0)
Lymphocytes Relative: 8 % — ABNORMAL LOW (ref 12–46)
Lymphs Abs: 0.6 10*3/uL — ABNORMAL LOW (ref 0.7–4.0)
MCH: 34 pg (ref 26.0–34.0)
MCHC: 33.3 g/dL (ref 30.0–36.0)
MCV: 102.1 fL — ABNORMAL HIGH (ref 78.0–100.0)
Monocytes Absolute: 0.6 10*3/uL (ref 0.1–1.0)
Monocytes Relative: 8 % (ref 3–12)
Neutro Abs: 6.2 10*3/uL (ref 1.7–7.7)
Neutrophils Relative %: 83 % — ABNORMAL HIGH (ref 43–77)
Platelets: 141 10*3/uL — ABNORMAL LOW (ref 150–400)
RBC: 3.29 MIL/uL — ABNORMAL LOW (ref 4.22–5.81)
RDW: 13.5 % (ref 11.5–15.5)
WBC: 7.3 10*3/uL (ref 4.0–10.5)

## 2013-10-01 LAB — MRSA PCR SCREENING: MRSA by PCR: NEGATIVE

## 2013-10-01 LAB — PROTIME-INR
INR: 0.93 (ref 0.00–1.49)
Prothrombin Time: 12.5 s (ref 11.6–15.2)

## 2013-10-01 MED ORDER — ESCITALOPRAM OXALATE 10 MG PO TABS
10.0000 mg | ORAL_TABLET | Freq: Every day | ORAL | Status: DC
  Administered 2013-10-01 – 2013-10-05 (×5): 10 mg via ORAL
  Filled 2013-10-01 (×5): qty 1

## 2013-10-01 MED ORDER — ONDANSETRON HCL 4 MG PO TABS
4.0000 mg | ORAL_TABLET | Freq: Four times a day (QID) | ORAL | Status: DC | PRN
  Administered 2013-10-03: 4 mg via ORAL
  Filled 2013-10-01: qty 1

## 2013-10-01 MED ORDER — PANTOPRAZOLE SODIUM 40 MG PO TBEC
40.0000 mg | DELAYED_RELEASE_TABLET | Freq: Every day | ORAL | Status: DC
  Filled 2013-10-01: qty 1

## 2013-10-01 MED ORDER — ACETAMINOPHEN 325 MG PO TABS
650.0000 mg | ORAL_TABLET | Freq: Two times a day (BID) | ORAL | Status: DC | PRN
  Administered 2013-10-02: 650 mg via ORAL
  Filled 2013-10-01: qty 2

## 2013-10-01 MED ORDER — POTASSIUM CHLORIDE IN NACL 20-0.9 MEQ/L-% IV SOLN
INTRAVENOUS | Status: DC
  Administered 2013-10-01: 09:00:00 via INTRAVENOUS
  Filled 2013-10-01 (×2): qty 1000

## 2013-10-01 MED ORDER — DIVALPROEX SODIUM 125 MG PO CPSP
500.0000 mg | ORAL_CAPSULE | Freq: Every day | ORAL | Status: DC
  Administered 2013-10-01 – 2013-10-05 (×5): 500 mg via ORAL
  Filled 2013-10-01 (×5): qty 4

## 2013-10-01 MED ORDER — TAMSULOSIN HCL 0.4 MG PO CAPS
0.4000 mg | ORAL_CAPSULE | Freq: Every day | ORAL | Status: DC
  Administered 2013-10-01 – 2013-10-05 (×5): 0.4 mg via ORAL
  Filled 2013-10-01 (×5): qty 1

## 2013-10-01 MED ORDER — LORAZEPAM 0.5 MG PO TABS
0.5000 mg | ORAL_TABLET | Freq: Three times a day (TID) | ORAL | Status: DC | PRN
  Administered 2013-10-02 – 2013-10-03 (×2): 0.5 mg via ORAL
  Filled 2013-10-01 (×2): qty 1

## 2013-10-01 MED ORDER — PANTOPRAZOLE SODIUM 40 MG IV SOLR
40.0000 mg | Freq: Every day | INTRAVENOUS | Status: DC
  Administered 2013-10-01: 40 mg via INTRAVENOUS
  Filled 2013-10-01 (×2): qty 40

## 2013-10-01 MED ORDER — ONDANSETRON 4 MG PO TBDP
8.0000 mg | ORAL_TABLET | Freq: Once | ORAL | Status: AC
  Administered 2013-10-01: 8 mg via ORAL
  Filled 2013-10-01: qty 2

## 2013-10-01 MED ORDER — ONDANSETRON HCL 4 MG/2ML IJ SOLN
4.0000 mg | Freq: Four times a day (QID) | INTRAMUSCULAR | Status: DC | PRN
  Administered 2013-10-01 – 2013-10-05 (×2): 4 mg via INTRAVENOUS
  Filled 2013-10-01 (×2): qty 2

## 2013-10-01 MED ORDER — DOCUSATE SODIUM 100 MG PO CAPS
100.0000 mg | ORAL_CAPSULE | Freq: Two times a day (BID) | ORAL | Status: DC
  Administered 2013-10-01 – 2013-10-06 (×11): 100 mg via ORAL
  Filled 2013-10-01 (×9): qty 1

## 2013-10-01 MED ORDER — LINACLOTIDE 290 MCG PO CAPS
290.0000 ug | ORAL_CAPSULE | Freq: Every morning | ORAL | Status: DC
  Administered 2013-10-01 – 2013-10-06 (×6): 290 ug via ORAL
  Filled 2013-10-01 (×6): qty 1

## 2013-10-01 MED ORDER — OXYCODONE-ACETAMINOPHEN 5-325 MG PO TABS
2.0000 | ORAL_TABLET | Freq: Once | ORAL | Status: AC
  Administered 2013-10-01: 2 via ORAL
  Filled 2013-10-01: qty 2

## 2013-10-01 MED ORDER — MORPHINE SULFATE 2 MG/ML IJ SOLN
2.0000 mg | INTRAMUSCULAR | Status: DC | PRN
  Administered 2013-10-01: 4 mg via INTRAVENOUS
  Administered 2013-10-01 (×3): 2 mg via INTRAVENOUS
  Administered 2013-10-01: 4 mg via INTRAVENOUS
  Administered 2013-10-02: 2 mg via INTRAVENOUS
  Administered 2013-10-02 (×2): 4 mg via INTRAVENOUS
  Filled 2013-10-01: qty 1
  Filled 2013-10-01 (×2): qty 2
  Filled 2013-10-01 (×2): qty 1
  Filled 2013-10-01: qty 2
  Filled 2013-10-01: qty 1
  Filled 2013-10-01: qty 2

## 2013-10-01 MED ORDER — DUTASTERIDE 0.5 MG PO CAPS
0.5000 mg | ORAL_CAPSULE | Freq: Every morning | ORAL | Status: DC
  Administered 2013-10-01 – 2013-10-06 (×6): 0.5 mg via ORAL
  Filled 2013-10-01 (×6): qty 1

## 2013-10-01 MED ORDER — METHOCARBAMOL 500 MG PO TABS
500.0000 mg | ORAL_TABLET | Freq: Every day | ORAL | Status: DC
  Administered 2013-10-01 – 2013-10-05 (×5): 500 mg via ORAL
  Filled 2013-10-01 (×5): qty 1

## 2013-10-01 MED ORDER — LORAZEPAM 0.5 MG PO TABS
0.5000 mg | ORAL_TABLET | Freq: Two times a day (BID) | ORAL | Status: DC
  Administered 2013-10-01 – 2013-10-06 (×11): 0.5 mg via ORAL
  Filled 2013-10-01 (×11): qty 1

## 2013-10-01 MED ORDER — CEFAZOLIN SODIUM 1-5 GM-% IV SOLN
1.0000 g | Freq: Once | INTRAVENOUS | Status: AC
  Administered 2013-10-01: 1 g via INTRAVENOUS
  Filled 2013-10-01: qty 50

## 2013-10-01 MED ORDER — METHADONE HCL 5 MG PO TABS
15.0000 mg | ORAL_TABLET | Freq: Two times a day (BID) | ORAL | Status: DC
  Administered 2013-10-01 – 2013-10-06 (×11): 15 mg via ORAL
  Filled 2013-10-01: qty 1
  Filled 2013-10-01 (×2): qty 2
  Filled 2013-10-01 (×2): qty 1
  Filled 2013-10-01 (×2): qty 2
  Filled 2013-10-01: qty 1
  Filled 2013-10-01: qty 2
  Filled 2013-10-01: qty 1
  Filled 2013-10-01: qty 2
  Filled 2013-10-01 (×3): qty 1

## 2013-10-01 MED ORDER — LEVOTHYROXINE SODIUM 88 MCG PO TABS
88.0000 ug | ORAL_TABLET | Freq: Every day | ORAL | Status: DC
  Administered 2013-10-01 – 2013-10-06 (×6): 88 ug via ORAL
  Filled 2013-10-01 (×7): qty 1

## 2013-10-01 MED ORDER — OXYCODONE-ACETAMINOPHEN 5-325 MG PO TABS
1.0000 | ORAL_TABLET | ORAL | Status: DC | PRN
  Administered 2013-10-01 – 2013-10-02 (×4): 1 via ORAL
  Filled 2013-10-01 (×4): qty 1

## 2013-10-01 MED ORDER — POLYETHYLENE GLYCOL 3350 17 G PO PACK: 17.0000 g | PACK | Freq: Every day | ORAL | Status: DC | PRN

## 2013-10-01 MED ORDER — RIVASTIGMINE 4.6 MG/24HR TD PT24
13.3000 mg | MEDICATED_PATCH | Freq: Every day | TRANSDERMAL | Status: DC
  Administered 2013-10-01 – 2013-10-06 (×6): 13.8 mg via TRANSDERMAL
  Filled 2013-10-01 (×6): qty 3

## 2013-10-01 NOTE — Progress Notes (Signed)
The patient is awake, alert and oriented.  Complaining of head, face and neck pain.  BP soft at  98/46, likely due to pain medication.  Otherwise stable, will start IS, PT/OT eval and treat.  Methadone and percocet for pain, PRN morphine if BP allows.    Tamryn Popko, ANP-BC

## 2013-10-01 NOTE — ED Notes (Signed)
Pt is becoming agitated shouting "Get this thing off" and "where's my wife?".

## 2013-10-01 NOTE — ED Notes (Signed)
Pt is reporting nausea.

## 2013-10-01 NOTE — Progress Notes (Signed)
Patient arrived from the ED this morning via stretcher, on 2L [place], oriented to self and situation. He c/o pain of 9/10 in his head and left shoulder. He was assessed, oriented to unit, and provided care per MD orders. Wife has been called and notified in regards to patients condition. Will continue to monitor. Richardean Sale, RN

## 2013-10-01 NOTE — ED Notes (Signed)
Pt is from [facility]. Pt had a possible unwitnessed fall. EMS reports that the pt walked to the nurse's station after the event. Pt does not remember falling, and does not remember what happened. Pt with large hematoma surrounding left eye, with lowered zygomatic arch, and two periorbital lacerations around the left eye.

## 2013-10-01 NOTE — ED Notes (Addendum)
CT notified that the pt is ready for his CT scan. Also, pt is nervous about his wife getting here. This RN has attmepted to contact the pt's wife twice, with no success. Nurse first notified that pt's wife should be coming and to send her back when she gets here.

## 2013-10-01 NOTE — H&P (Addendum)
George Foster is an 80 y.o. male.   Chief Complaint: Fall HPI: Patient is a nursing home resident with a history of dementia, chronic pain, and polypharmacy. He was seen in the ED after a fall on 09/26/2013. He fell out of bed again tonight while sleeping. He does not remember falling. Uncertain if he had loss of consciousness. He was able to ambulate at the scene out to the nursing station to get help. He complains of pain from facial lacerations around his left eye. He denies visual changes. He was brought to the emergency department as a nontrauma code activation. Workup demonstrates facial lacerations, parafalcine and L middle cranial fossa subdural hematomas, L sphenoid wing fracture, as well as L maxillary sinus, zygomatic arch, and orbital fractures. I was asked to admit him to the trauma service.  Past Medical History  Diagnosis Date  . Dementia   . Hypertension   . Arthritis   . Hypothyroidism   . Chronic back pain   . Anxiety disorder   . Gastroesophageal reflux disease   . Polypharmacy     History of hospitalizations for altered mental status related to medications  . Chronic constipation     with fecal impactions, recurrent    Past Surgical History  Procedure Laterality Date  . Appendectomy    . Removal of nonrestorable teeth numbers 2, 4, 6, 12, 13, 18, 29      Family History  Problem Relation Age of Onset  . Heart disease Father    Social History:  reports that he has never smoked. He has never used smokeless tobacco. He reports that he does not drink alcohol or use illicit drugs.  Allergies: No Known Allergies   (Not in a hospital admission)  Results for orders placed during the hospital encounter of 10/01/13 (from the past 48 hour(s))  CBC WITH DIFFERENTIAL     Status: Abnormal   Collection Time    10/01/13  4:54 AM      Result Value Ref Range   WBC 7.3  4.0 - 10.5 K/uL   RBC 3.29 (*) 4.22 - 5.81 MIL/uL   Hemoglobin 11.2 (*) 13.0 - 17.0 g/dL   HCT 33.6 (*)  39.0 - 52.0 %   MCV 102.1 (*) 78.0 - 100.0 fL   MCH 34.0  26.0 - 34.0 pg   MCHC 33.3  30.0 - 36.0 g/dL   RDW 13.5  11.5 - 15.5 %   Platelets 141 (*) 150 - 400 K/uL   Neutrophils Relative % 83 (*) 43 - 77 %   Neutro Abs 6.2  1.7 - 7.7 K/uL   Lymphocytes Relative 8 (*) 12 - 46 %   Lymphs Abs 0.6 (*) 0.7 - 4.0 K/uL   Monocytes Relative 8  3 - 12 %   Monocytes Absolute 0.6  0.1 - 1.0 K/uL   Eosinophils Relative 1  0 - 5 %   Eosinophils Absolute 0.1  0.0 - 0.7 K/uL   Basophils Relative 0  0 - 1 %   Basophils Absolute 0.0  0.0 - 0.1 K/uL  PROTIME-INR     Status: None   Collection Time    10/01/13  4:54 AM      Result Value Ref Range   Prothrombin Time 12.5  11.6 - 15.2 seconds   INR 0.93  0.00 - 6.23  BASIC METABOLIC PANEL     Status: Abnormal   Collection Time    10/01/13  4:54 AM  Result Value Ref Range   Sodium 140  137 - 147 mEq/L   Potassium 4.2  3.7 - 5.3 mEq/L   Chloride 101  96 - 112 mEq/L   CO2 26  19 - 32 mEq/L   Glucose, Bld 156 (*) 70 - 99 mg/dL   BUN 26 (*) 6 - 23 mg/dL   Creatinine, Ser 1.16  0.50 - 1.35 mg/dL   Calcium 8.7  8.4 - 10.5 mg/dL   GFR calc non Af Amer 55 (*) >90 mL/min   GFR calc Af Amer 63 (*) >90 mL/min   Comment: (NOTE)     The eGFR has been calculated using the CKD EPI equation.     This calculation has not been validated in all clinical situations.     eGFR's persistently <90 mL/min signify possible Chronic Kidney     Disease.   Anion gap 13  5 - 15   Ct Head Wo Contrast  10/01/2013   CLINICAL DATA:  Possible unwitnessed fall. Left periorbital hematoma.  EXAM: CT HEAD WITHOUT CONTRAST  CT MAXILLOFACIAL WITHOUT CONTRAST  CT CERVICAL SPINE WITHOUT CONTRAST  TECHNIQUE: Multidetector CT imaging of the head, cervical spine, and maxillofacial structures were performed using the standard protocol without intravenous contrast. Multiplanar CT image reconstructions of the cervical spine and maxillofacial structures were also generated.  COMPARISON:  CT  of the head and cervical spine September 26, 2013  FINDINGS: CT HEAD FINDINGS  3 mm right parafalcine acute subdural hematoma. In addition, 3 mm left middle cranial fossa subdural hematoma appears acute. No intraparenchymal hemorrhage, mass effect or midline shift.  Moderate ventriculomegaly, likely on the basis of global parenchymal brain volume loss as there is overall commensurate enlargement of cerebral sulci and cerebellar folia, similar. Patchy to confluent supratentorial white matter hypodensities. No acute large vascular territory infarct. Basal cisterns are patent. Severe calcific atherosclerosis the carotid siphons. Nondisplaced fracture of the left dx greater wing of the sphenoid.  CT MAXILLOFACIAL FINDINGS  Minimally depressed left anterior maxillary wall fracture, segmental a mildly depressed left posterolateral maxillary wall fracture. Segmental depressed left zygomatic arch fracture.  Nondisplaced left lateral orbital wall and rim fracture. Left orbital floor fracture with 2 mm of depressed bony fragments, minimal external herniation of extraconal fat.  Ocular globes intact. Extraocular muscles located without hematoma. Preservation of the retrobulbar fat. Optic nerve sheath complexes are unremarkable. Status post bilateral ocular lens implants. Mild left periorbital soft tissue swelling without subcutaneous gas or radiopaque foreign bodies.  Layering hyperdense blood products in left maxillary sinus, minimal right maxillary and sphenoid air-fluid level of paranasal sinus mucosal thickening. No destructive bony lesions. Patient is edentulous. The mandible is intact and the condyles located.  CT CERVICAL SPINE FINDINGS  Cervical vertebral bodies are intact and aligned with straightened cervical lordosis. Severe C4-5 through C6-7 degenerative disc disease, mild at C3-4. C1-2 articulation maintained with moderate arthropathy. Bone mineral density is decreased without destructive bony lesions.  Mild chronic T2  compression fracture. Mild calcific atherosclerosis of the carotid bulbs. 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  IMPRESSION: CT head: Nondisplaced left greater wing of the sphenoid fracture with 3 mm left middle cranial fossa subdural hematoma, 3 mm right parafalcine subdural hematomas.  No intraparenchymal hemorrhage. Involutional changes. Moderate white matter changes suggest chronic small vessel ischemic disease.  Maxillofacial CT: Left zygomaticomaxillary complex fracture. No postseptal hematoma.  CT cervical spine: Straightened cervical lordosis without acute fracture nor malalignment.  Critical Value/emergent results were called by telephone  at the time of interpretation on 10/01/2013 at 4:33 AM to Dr. Linton Flemings , who verbally acknowledged these results.   Electronically Signed   By: Elon Alas   On: 10/01/2013 04:34   Ct Cervical Spine Wo Contrast  10/01/2013   CLINICAL DATA:  Possible unwitnessed fall. Left periorbital hematoma.  EXAM: CT HEAD WITHOUT CONTRAST  CT MAXILLOFACIAL WITHOUT CONTRAST  CT CERVICAL SPINE WITHOUT CONTRAST  TECHNIQUE: Multidetector CT imaging of the head, cervical spine, and maxillofacial structures were performed using the standard protocol without intravenous contrast. Multiplanar CT image reconstructions of the cervical spine and maxillofacial structures were also generated.  COMPARISON:  CT of the head and cervical spine September 26, 2013  FINDINGS: CT HEAD FINDINGS  3 mm right parafalcine acute subdural hematoma. In addition, 3 mm left middle cranial fossa subdural hematoma appears acute. No intraparenchymal hemorrhage, mass effect or midline shift.  Moderate ventriculomegaly, likely on the basis of global parenchymal brain volume loss as there is overall commensurate enlargement of cerebral sulci and cerebellar folia, similar. Patchy to confluent supratentorial white matter hypodensities. No acute large vascular territory infarct. Basal cisterns  are patent. Severe calcific atherosclerosis the carotid siphons. Nondisplaced fracture of the left dx greater wing of the sphenoid.  CT MAXILLOFACIAL FINDINGS  Minimally depressed left anterior maxillary wall fracture, segmental a mildly depressed left posterolateral maxillary wall fracture. Segmental depressed left zygomatic arch fracture.  Nondisplaced left lateral orbital wall and rim fracture. Left orbital floor fracture with 2 mm of depressed bony fragments, minimal external herniation of extraconal fat.  Ocular globes intact. Extraocular muscles located without hematoma. Preservation of the retrobulbar fat. Optic nerve sheath complexes are unremarkable. Status post bilateral ocular lens implants. Mild left periorbital soft tissue swelling without subcutaneous gas or radiopaque foreign bodies.  Layering hyperdense blood products in left maxillary sinus, minimal right maxillary and sphenoid air-fluid level of paranasal sinus mucosal thickening. No destructive bony lesions. Patient is edentulous. The mandible is intact and the condyles located.  CT CERVICAL SPINE FINDINGS  Cervical vertebral bodies are intact and aligned with straightened cervical lordosis. Severe C4-5 through C6-7 degenerative disc disease, mild at C3-4. C1-2 articulation maintained with moderate arthropathy. Bone mineral density is decreased without destructive bony lesions.  Mild chronic T2 compression fracture. Mild calcific atherosclerosis of the carotid bulbs. 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  IMPRESSION: CT head: Nondisplaced left greater wing of the sphenoid fracture with 3 mm left middle cranial fossa subdural hematoma, 3 mm right parafalcine subdural hematomas.  No intraparenchymal hemorrhage. Involutional changes. Moderate white matter changes suggest chronic small vessel ischemic disease.  Maxillofacial CT: Left zygomaticomaxillary complex fracture. No postseptal hematoma.  CT cervical spine:  Straightened cervical lordosis without acute fracture nor malalignment.  Critical Value/emergent results were called by telephone at the time of interpretation on 10/01/2013 at 4:33 AM to Dr. Linton Flemings , who verbally acknowledged these results.   Electronically Signed   By: Elon Alas   On: 10/01/2013 04:34   Ct Maxillofacial Wo Cm  10/01/2013   CLINICAL DATA:  Possible unwitnessed fall. Left periorbital hematoma.  EXAM: CT HEAD WITHOUT CONTRAST  CT MAXILLOFACIAL WITHOUT CONTRAST  CT CERVICAL SPINE WITHOUT CONTRAST  TECHNIQUE: Multidetector CT imaging of the head, cervical spine, and maxillofacial structures were performed using the standard protocol without intravenous contrast. Multiplanar CT image reconstructions of the cervical spine and maxillofacial structures were also generated.  COMPARISON:  CT of the head and cervical spine September 26, 2013  FINDINGS: CT HEAD FINDINGS  3 mm right parafalcine acute subdural hematoma. In addition, 3 mm left middle cranial fossa subdural hematoma appears acute. No intraparenchymal hemorrhage, mass effect or midline shift.  Moderate ventriculomegaly, likely on the basis of global parenchymal brain volume loss as there is overall commensurate enlargement of cerebral sulci and cerebellar folia, similar. Patchy to confluent supratentorial white matter hypodensities. No acute large vascular territory infarct. Basal cisterns are patent. Severe calcific atherosclerosis the carotid siphons. Nondisplaced fracture of the left dx greater wing of the sphenoid.  CT MAXILLOFACIAL FINDINGS  Minimally depressed left anterior maxillary wall fracture, segmental a mildly depressed left posterolateral maxillary wall fracture. Segmental depressed left zygomatic arch fracture.  Nondisplaced left lateral orbital wall and rim fracture. Left orbital floor fracture with 2 mm of depressed bony fragments, minimal external herniation of extraconal fat.  Ocular globes intact. Extraocular muscles  located without hematoma. Preservation of the retrobulbar fat. Optic nerve sheath complexes are unremarkable. Status post bilateral ocular lens implants. Mild left periorbital soft tissue swelling without subcutaneous gas or radiopaque foreign bodies.  Layering hyperdense blood products in left maxillary sinus, minimal right maxillary and sphenoid air-fluid level of paranasal sinus mucosal thickening. No destructive bony lesions. Patient is edentulous. The mandible is intact and the condyles located.  CT CERVICAL SPINE FINDINGS  Cervical vertebral bodies are intact and aligned with straightened cervical lordosis. Severe C4-5 through C6-7 degenerative disc disease, mild at C3-4. C1-2 articulation maintained with moderate arthropathy. Bone mineral density is decreased without destructive bony lesions.  Mild chronic T2 compression fracture. Mild calcific atherosclerosis of the carotid bulbs. 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  IMPRESSION: CT head: Nondisplaced left greater wing of the sphenoid fracture with 3 mm left middle cranial fossa subdural hematoma, 3 mm right parafalcine subdural hematomas.  No intraparenchymal hemorrhage. Involutional changes. Moderate white matter changes suggest chronic small vessel ischemic disease.  Maxillofacial CT: Left zygomaticomaxillary complex fracture. No postseptal hematoma.  CT cervical spine: Straightened cervical lordosis without acute fracture nor malalignment.  Critical Value/emergent results were called by telephone at the time of interpretation on 10/01/2013 at 4:33 AM to Dr. Linton Flemings , who verbally acknowledged these results.   Electronically Signed   By: Elon Alas   On: 10/01/2013 04:34    Review of Systems  Unable to perform ROS: dementia    Blood pressure 129/65, temperature 98.3 F (36.8 C), temperature source Oral, resp. rate 15, SpO2 98.00%. Physical Exam  Constitutional: He appears well-developed and well-nourished. No  distress.  HENT:  Head: Head is with laceration.    Right Ear: Tympanic membrane, external ear and ear canal normal.  Left Ear: Tympanic membrane, external ear and ear canal normal.  Nose: No rhinorrhea, sinus tenderness or septal deviation.  Mouth/Throat: Uvula is midline, oropharynx is clear and moist and mucous membranes are normal.  Laceration over left eyebrow, laceration high on left cheek - both are being repaired by EDP  Eyes: EOM are normal. Pupils are equal, round, and reactive to light. Right eye exhibits no discharge. Left eye exhibits no discharge. No scleral icterus.  Conjunctival hemorrhage on left  Neck:  Posterior midline tenderness, no step-off, collar left in place  Cardiovascular: Normal rate and normal heart sounds.   Respiratory: Effort normal and breath sounds normal. No respiratory distress. He has no wheezes. He has no rales.  GI: Soft. Bowel sounds are normal. He exhibits no distension. There is no tenderness. There is no rebound and no guarding.  Umbilical hernia easily reduces  Musculoskeletal: Normal range of motion.  Minimal edema  Neurological: He displays no tremor. He exhibits normal muscle tone. He displays no seizure activity. GCS eye subscore is 4. GCS verbal subscore is 5. GCS motor subscore is 6.  Strength equal in 4 extremities, no significant sensory deficit noted  Skin: Skin is warm.  Psychiatric: He has a normal mood and affect.     Assessment/Plan Fall Right orbit, maxillary sinus, and zygoma fractures - Dr. Benson Norway to see in consultation Facial lacerations - being closed in the ED Left sphenoid wing fracture, left middle cranial fossa subdural hematoma, parafalcine    subdural hematoma - Dr. Saintclair Halsted to see in consultation Admit to ICU, trauma service  [place] E 10/01/2013, 5:50 AM

## 2013-10-01 NOTE — ED Provider Notes (Signed)
CSN: [id number]     Arrival date & time 10/01/13  0962 History   First MD Initiated Contact with Patient 10/01/13 0314     Chief Complaint  Patient presents with  . Fall     (Consider location/radiation/quality/duration/timing/severity/associated sxs/prior Treatment) HPI 80 year old male presents to the emergency department from his nursing facility via EMS after a fall.  Patient has history of falls, seen in the emergency department on the eighth of this month for same.  Patient does not remember the fall.  He has history of dementia, chronic pain and polypharmacy.  Patient with laceration to left upper hilar and left cheek.  He reports some pain with movement of his left eye.  He denies any visual changes.  Patient was able to ambulate to the nursing station to ask for help. Past Medical History  Diagnosis Date  . Dementia   . Hypertension   . Arthritis   . Hypothyroidism   . Chronic back pain   . Anxiety disorder   . Gastroesophageal reflux disease   . Polypharmacy     History of hospitalizations for altered mental status related to medications  . Chronic constipation     with fecal impactions, recurrent   Past Surgical History  Procedure Laterality Date  . Appendectomy    . Removal of nonrestorable teeth numbers 2, 4, 6, 12, 13, 18, 29     Family History  Problem Relation Age of Onset  . Heart disease Father    History  Substance Use Topics  . Smoking status: Never Smoker   . Smokeless tobacco: Never Used  . Alcohol Use: No     Comment: Pt denies    Review of Systems  Unable to perform ROS: Dementia      Allergies  Review of patient's allergies indicates no known allergies.  Home Medications   Prior to Admission medications   Medication Sig Start Date End Date Taking? Authorizing Provider  acetaminophen (TYLENOL) 325 MG tablet Take 650 mg by mouth every 12 (twelve) hours.    Historical Provider, MD  divalproex (DEPAKOTE SPRINKLE) 125 MG capsule Take 500 mg  by mouth at bedtime.    Historical Provider, MD  divalproex (DEPAKOTE) 500 MG DR tablet Take 1 tablet (500 mg total) by mouth every 12 (twelve) hours. 06/11/11 06/10/12  Theodis Blaze, MD  docusate sodium (COLACE) 100 MG capsule Take 100 mg by mouth 2 (two) times daily.    Historical Provider, MD  dutasteride (AVODART) 0.5 MG capsule Take 0.5 mg by mouth every morning.     Historical Provider, MD  escitalopram (LEXAPRO) 10 MG tablet Take 10 mg by mouth at bedtime.     Historical Provider, MD  eszopiclone (LUNESTA) 1 MG TABS tablet Take 1 mg by mouth at bedtime as needed for sleep. Take immediately before bedtime    Historical Provider, MD  gabapentin (NEURONTIN) 600 MG tablet Take 600 mg by mouth 2 (two) times daily.     Historical Provider, MD  guaifenesin (ROBITUSSIN) 100 MG/5ML syrup Take 200 mg by mouth every 4 (four) hours as needed for cough.    Historical Provider, MD  levothyroxine (SYNTHROID, LEVOTHROID) 88 MCG tablet Take 88 mcg by mouth at bedtime.    Historical Provider, MD  Linaclotide Rolan Lipa) 290 MCG CAPS capsule Take 290 mcg by mouth every morning.     Historical Provider, MD  LORazepam (ATIVAN) 0.5 MG tablet Take 0.5 mg by mouth 2 (two) times daily.    Historical Provider, MD  LORazepam (ATIVAN) 0.5 MG tablet Take 0.5 mg by mouth every 8 (eight) hours as needed for anxiety.    Historical Provider, MD  methadone (DOLOPHINE) 10 MG tablet Take 15 mg by mouth every 12 (twelve) hours.    Historical Provider, MD  methocarbamol (ROBAXIN) 500 MG tablet Take 500 mg by mouth at bedtime.    Historical Provider, MD  Multiple Vitamins-Minerals (MULTIVITAMINS THER. W/MINERALS) TABS Take 1 tablet by mouth daily.    Historical Provider, MD  omeprazole (PRILOSEC) 40 MG capsule Take 40 mg by mouth daily.    Historical Provider, MD  oxyCODONE-acetaminophen (PERCOCET/ROXICET) 5-325 MG per tablet Take one tablet by mouth every 4 hours as needed for severe pain 07/23/13   Tiffany L Reed, DO  polyethylene  glycol (MIRALAX / GLYCOLAX) packet Take 17 g by mouth daily as needed for mild constipation (hold for diarrhea).    Historical Provider, MD  Probiotic Product (ALIGN) 4 MG CAPS Take 4 mg by mouth daily.    Historical Provider, MD  Rivastigmine (EXELON) 13.3 MG/24HR PT24 Place 4.6 patches onto the skin.     Historical Provider, MD  tamsulosin (FLOMAX) 0.4 MG CAPS Take 0.4 mg by mouth at bedtime.     Historical Provider, MD   There were no vitals taken for this visit. Physical Exam  Nursing note and vitals reviewed. Constitutional: He is oriented to person, place, and time. He appears well-developed and well-nourished. No distress.  HENT:  Head: Normocephalic.  Right Ear: External ear normal.  Left Ear: External ear normal.  Nose: Nose normal.  Mouth/Throat: Oropharynx is clear and moist.  3 cm laceration to left upper outer eyelid, 3 cm laceration to left cheek and zygomatic arch.  Bleeding is controlled.  Eyes: Conjunctivae and EOM are normal. Pupils are equal, round, and reactive to light.  Patient has subconjunctival hemorrhage laterally to left eye  Neck: Normal range of motion. Neck supple. No JVD present. No tracheal deviation present. No thyromegaly present.  C-collar in place, no pain to palpation of posterior cervical spine, no step-off or crepitus  Cardiovascular: Normal rate, regular rhythm, normal heart sounds and intact distal pulses.  Exam reveals no gallop and no friction rub.   No murmur heard. Pulmonary/Chest: Effort normal and breath sounds normal. No stridor. No respiratory distress. He has no wheezes. He has no rales. He exhibits no tenderness.  Abdominal: Soft. Bowel sounds are normal. He exhibits no distension and no mass. There is no tenderness. There is no rebound and no guarding.  Musculoskeletal: Normal range of motion. He exhibits no edema and no tenderness.  Lymphadenopathy:    He has no cervical adenopathy.  Neurological: He is alert and oriented to person,  place, and time. He has normal reflexes. No cranial nerve deficit. He exhibits normal muscle tone. Coordination normal.  Skin: Skin is warm and dry. No rash noted. No erythema. No pallor.  Psychiatric: He has a normal mood and affect. His behavior is normal. Judgment and thought content normal.    ED Course  Procedures (including critical care time) Labs Review Labs Reviewed - No data to display  Imaging Review Ct Head Wo Contrast  10/01/2013   CLINICAL DATA:  Possible unwitnessed fall. Left periorbital hematoma.  EXAM: CT HEAD WITHOUT CONTRAST  CT MAXILLOFACIAL WITHOUT CONTRAST  CT CERVICAL SPINE WITHOUT CONTRAST  TECHNIQUE: Multidetector CT imaging of the head, cervical spine, and maxillofacial structures were performed using the standard protocol without intravenous contrast. Multiplanar CT image reconstructions of the  cervical spine and maxillofacial structures were also generated.  COMPARISON:  CT of the head and cervical spine September 26, 2013  FINDINGS: CT HEAD FINDINGS  3 mm right parafalcine acute subdural hematoma. In addition, 3 mm left middle cranial fossa subdural hematoma appears acute. No intraparenchymal hemorrhage, mass effect or midline shift.  Moderate ventriculomegaly, likely on the basis of global parenchymal brain volume loss as there is overall commensurate enlargement of cerebral sulci and cerebellar folia, similar. Patchy to confluent supratentorial white matter hypodensities. No acute large vascular territory infarct. Basal cisterns are patent. Severe calcific atherosclerosis the carotid siphons. Nondisplaced fracture of the left dx greater wing of the sphenoid.  CT MAXILLOFACIAL FINDINGS  Minimally depressed left anterior maxillary wall fracture, segmental a mildly depressed left posterolateral maxillary wall fracture. Segmental depressed left zygomatic arch fracture.  Nondisplaced left lateral orbital wall and rim fracture. Left orbital floor fracture with 2 mm of depressed bony  fragments, minimal external herniation of extraconal fat.  Ocular globes intact. Extraocular muscles located without hematoma. Preservation of the retrobulbar fat. Optic nerve sheath complexes are unremarkable. Status post bilateral ocular lens implants. Mild left periorbital soft tissue swelling without subcutaneous gas or radiopaque foreign bodies.  Layering hyperdense blood products in left maxillary sinus, minimal right maxillary and sphenoid air-fluid level of paranasal sinus mucosal thickening. No destructive bony lesions. Patient is edentulous. The mandible is intact and the condyles located.  CT CERVICAL SPINE FINDINGS  Cervical vertebral bodies are intact and aligned with straightened cervical lordosis. Severe C4-5 through C6-7 degenerative disc disease, mild at C3-4. C1-2 articulation maintained with moderate arthropathy. Bone mineral density is decreased without destructive bony lesions.  Mild chronic T2 compression fracture. Mild calcific atherosclerosis of the carotid bulbs. 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  IMPRESSION: CT head: Nondisplaced left greater wing of the sphenoid fracture with 3 mm left middle cranial fossa subdural hematoma, 3 mm right parafalcine subdural hematomas.  No intraparenchymal hemorrhage. Involutional changes. Moderate white matter changes suggest chronic small vessel ischemic disease.  Maxillofacial CT: Left zygomaticomaxillary complex fracture. No postseptal hematoma.  CT cervical spine: Straightened cervical lordosis without acute fracture nor malalignment.  Critical Value/emergent results were called by telephone at the time of interpretation on 10/01/2013 at 4:33 AM to Dr. Linton Flemings , who verbally acknowledged these results.   Electronically Signed   By: Elon Alas   On: 10/01/2013 04:34   Ct Cervical Spine Wo Contrast  10/01/2013   CLINICAL DATA:  Possible unwitnessed fall. Left periorbital hematoma.  EXAM: CT HEAD WITHOUT CONTRAST   CT MAXILLOFACIAL WITHOUT CONTRAST  CT CERVICAL SPINE WITHOUT CONTRAST  TECHNIQUE: Multidetector CT imaging of the head, cervical spine, and maxillofacial structures were performed using the standard protocol without intravenous contrast. Multiplanar CT image reconstructions of the cervical spine and maxillofacial structures were also generated.  COMPARISON:  CT of the head and cervical spine September 26, 2013  FINDINGS: CT HEAD FINDINGS  3 mm right parafalcine acute subdural hematoma. In addition, 3 mm left middle cranial fossa subdural hematoma appears acute. No intraparenchymal hemorrhage, mass effect or midline shift.  Moderate ventriculomegaly, likely on the basis of global parenchymal brain volume loss as there is overall commensurate enlargement of cerebral sulci and cerebellar folia, similar. Patchy to confluent supratentorial white matter hypodensities. No acute large vascular territory infarct. Basal cisterns are patent. Severe calcific atherosclerosis the carotid siphons. Nondisplaced fracture of the left dx greater wing of the sphenoid.  CT MAXILLOFACIAL FINDINGS  Minimally depressed  left anterior maxillary wall fracture, segmental a mildly depressed left posterolateral maxillary wall fracture. Segmental depressed left zygomatic arch fracture.  Nondisplaced left lateral orbital wall and rim fracture. Left orbital floor fracture with 2 mm of depressed bony fragments, minimal external herniation of extraconal fat.  Ocular globes intact. Extraocular muscles located without hematoma. Preservation of the retrobulbar fat. Optic nerve sheath complexes are unremarkable. Status post bilateral ocular lens implants. Mild left periorbital soft tissue swelling without subcutaneous gas or radiopaque foreign bodies.  Layering hyperdense blood products in left maxillary sinus, minimal right maxillary and sphenoid air-fluid level of paranasal sinus mucosal thickening. No destructive bony lesions. Patient is edentulous. The  mandible is intact and the condyles located.  CT CERVICAL SPINE FINDINGS  Cervical vertebral bodies are intact and aligned with straightened cervical lordosis. Severe C4-5 through C6-7 degenerative disc disease, mild at C3-4. C1-2 articulation maintained with moderate arthropathy. Bone mineral density is decreased without destructive bony lesions.  Mild chronic T2 compression fracture. Mild calcific atherosclerosis of the carotid bulbs. 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  IMPRESSION: CT head: Nondisplaced left greater wing of the sphenoid fracture with 3 mm left middle cranial fossa subdural hematoma, 3 mm right parafalcine subdural hematomas.  No intraparenchymal hemorrhage. Involutional changes. Moderate white matter changes suggest chronic small vessel ischemic disease.  Maxillofacial CT: Left zygomaticomaxillary complex fracture. No postseptal hematoma.  CT cervical spine: Straightened cervical lordosis without acute fracture nor malalignment.  Critical Value/emergent results were called by telephone at the time of interpretation on 10/01/2013 at 4:33 AM to Dr. Linton Flemings , who verbally acknowledged these results.   Electronically Signed   By: Elon Alas   On: 10/01/2013 04:34   Ct Maxillofacial Wo Cm  10/01/2013   CLINICAL DATA:  Possible unwitnessed fall. Left periorbital hematoma.  EXAM: CT HEAD WITHOUT CONTRAST  CT MAXILLOFACIAL WITHOUT CONTRAST  CT CERVICAL SPINE WITHOUT CONTRAST  TECHNIQUE: Multidetector CT imaging of the head, cervical spine, and maxillofacial structures were performed using the standard protocol without intravenous contrast. Multiplanar CT image reconstructions of the cervical spine and maxillofacial structures were also generated.  COMPARISON:  CT of the head and cervical spine September 26, 2013  FINDINGS: CT HEAD FINDINGS  3 mm right parafalcine acute subdural hematoma. In addition, 3 mm left middle cranial fossa subdural hematoma appears acute. No  intraparenchymal hemorrhage, mass effect or midline shift.  Moderate ventriculomegaly, likely on the basis of global parenchymal brain volume loss as there is overall commensurate enlargement of cerebral sulci and cerebellar folia, similar. Patchy to confluent supratentorial white matter hypodensities. No acute large vascular territory infarct. Basal cisterns are patent. Severe calcific atherosclerosis the carotid siphons. Nondisplaced fracture of the left dx greater wing of the sphenoid.  CT MAXILLOFACIAL FINDINGS  Minimally depressed left anterior maxillary wall fracture, segmental a mildly depressed left posterolateral maxillary wall fracture. Segmental depressed left zygomatic arch fracture.  Nondisplaced left lateral orbital wall and rim fracture. Left orbital floor fracture with 2 mm of depressed bony fragments, minimal external herniation of extraconal fat.  Ocular globes intact. Extraocular muscles located without hematoma. Preservation of the retrobulbar fat. Optic nerve sheath complexes are unremarkable. Status post bilateral ocular lens implants. Mild left periorbital soft tissue swelling without subcutaneous gas or radiopaque foreign bodies.  Layering hyperdense blood products in left maxillary sinus, minimal right maxillary and sphenoid air-fluid level of paranasal sinus mucosal thickening. No destructive bony lesions. Patient is edentulous. The mandible is intact and the condyles located.  CT CERVICAL SPINE FINDINGS  Cervical vertebral bodies are intact and aligned with straightened cervical lordosis. Severe C4-5 through C6-7 degenerative disc disease, mild at C3-4. C1-2 articulation maintained with moderate arthropathy. Bone mineral density is decreased without destructive bony lesions.  Mild chronic T2 compression fracture. Mild calcific atherosclerosis of the carotid bulbs. 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  IMPRESSION: CT head: Nondisplaced left greater wing of the  sphenoid fracture with 3 mm left middle cranial fossa subdural hematoma, 3 mm right parafalcine subdural hematomas.  No intraparenchymal hemorrhage. Involutional changes. Moderate white matter changes suggest chronic small vessel ischemic disease.  Maxillofacial CT: Left zygomaticomaxillary complex fracture. No postseptal hematoma.  CT cervical spine: Straightened cervical lordosis without acute fracture nor malalignment.  Critical Value/emergent results were called by telephone at the time of interpretation on 10/01/2013 at 4:33 AM to Dr. Linton Flemings , who verbally acknowledged these results.   Electronically Signed   By: Elon Alas   On: 10/01/2013 04:34    LACERATION REPAIR Performed by: Kalman Drape Authorized by: Kalman Drape Consent: Verbal consent obtained. Risks and benefits: risks, benefits and alternatives were discussed Consent given by: patient Patient identity confirmed: provided demographic data Prepped and Draped in normal sterile fashion Wound explored  Laceration Location: left upper eyelid  Laceration Length: 2.5cm  No Foreign Bodies seen or palpated  Anesthesia: local infiltration  Local anesthetic: lidocaine 2% with epinephrine  Anesthetic total: 3 ml  Irrigation method: syringe Amount of cleaning: standard  Skin closure: 6.0 prolone  Number of sutures: 8  Technique: simple interrupted, moderate complexity  Patient tolerance: Patient tolerated the procedure well with no immediate complications.   LACERATION REPAIR Performed by: Kalman Drape Authorized by: Kalman Drape Consent: Verbal consent obtained. Risks and benefits: risks, benefits and alternatives were discussed Consent given by: patient Patient identity confirmed: provided demographic data Prepped and Draped in normal sterile fashion Wound explored  Laceration Location: left cheek  Laceration Length: 3 cm  No Foreign Bodies seen or palpated  Anesthesia: local infiltration  Local  anesthetic: lidocaine 2% with epinephrine  Anesthetic total: 3 ml  Irrigation method: syringe Amount of cleaning: standard  Skin closure: 5.0 rapid vicryl, 6.0 prolene  Number of sutures: 1 vertical mattress, 5.0 rapid vicryl, 8 simple interrupted 6.0 prolene  Technique: as above, complex repair  Patient tolerance: Patient tolerated the procedure well with no immediate complications.    EKG Interpretation None     CRITICAL CARE Performed by: Kalman Drape Total critical care time: 60 min Critical care time was exclusive of separately billable procedures and treating other patients. Critical care was necessary to treat or prevent imminent or life-threatening deterioration. Critical care was time spent personally by me on the following activities: development of treatment plan with patient and/or surrogate as well as nursing, discussions with consultants, evaluation of patient's response to treatment, examination of patient, obtaining history from patient or surrogate, ordering and performing treatments and interventions, ordering and review of laboratory studies, ordering and review of radiographic studies, pulse oximetry and re-evaluation of patient's condition.  MDM   Final diagnoses:  Fall at nursing home, initial encounter  SDH (subdural hematoma)  Basilar skull fracture, closed, initial encounter  Multiple facial fractures, closed, initial encounter  Polypharmacy  Facial laceration, initial encounter   80 year old male status post fall, history of same.  He is on multiple medications that could lead to him being off balance.  Patient was seen in emergency department on 8 to receive tetanus shot  at that time.  Plan for CT head face neck.  Will need repair of lacerations.   4:57 AM CT scan with basilar skull fracture, left sphenoid with 3 mm left middle cranial fossa SDH and 3 mm right parafalcine sdh.  Pt also with left tripod fracture, orbital floor fracture, orbital rim  and lateral wall fracture.  D/w trauma who will admit.  NSGY to consult, will d/w ENT.  6:24 AM Case d/w Dr Benson Norway with ENT who recommends skin closure of lacerations, he will evaluate fractures by CT.  Kalman Drape, MD 10/01/13 [phone]

## 2013-10-01 NOTE — Consult Note (Signed)
Reason for Consult: Closed head injury Referring Physician: Emergency department  George Foster is an 80 y.o. male.  HPI: Patient is an 80 year old 7 who fell out of bed has had previous history of falls out of bed extensive bruising to his face and was brought to the emergency room by EMS was unable to facial fractures and a question of a small falcine subdural. Patient was stabilized and admitted to the ICU for observation. Currently the patient reports headaches and some neck pain no numbness and tingling in his arms or his legs. Denies any significant past medical history with his heart or his lungs denies any anticoagulation.  Past Medical History  Diagnosis Date  . Dementia   . Hypertension   . Arthritis   . Hypothyroidism   . Chronic back pain   . Anxiety disorder   . Gastroesophageal reflux disease   . Polypharmacy     History of hospitalizations for altered mental status related to medications  . Chronic constipation     with fecal impactions, recurrent    Past Surgical History  Procedure Laterality Date  . Appendectomy    . Removal of nonrestorable teeth numbers 2, 4, 6, 12, 13, 18, 29      Family History  Problem Relation Age of Onset  . Heart disease Father     Social History:  reports that he has never smoked. He has never used smokeless tobacco. He reports that he does not drink alcohol or use illicit drugs.  Allergies: No Known Allergies  Medications: I have reviewed the patient's current medications.  Results for orders placed during the hospital encounter of 10/01/13 (from the past 48 hour(s))  CBC WITH DIFFERENTIAL     Status: Abnormal   Collection Time    10/01/13  4:54 AM      Result Value Ref Range   WBC 7.3  4.0 - 10.5 K/uL   RBC 3.29 (*) 4.22 - 5.81 MIL/uL   Hemoglobin 11.2 (*) 13.0 - 17.0 g/dL   HCT 33.6 (*) 39.0 - 52.0 %   MCV 102.1 (*) 78.0 - 100.0 fL   MCH 34.0  26.0 - 34.0 pg   MCHC 33.3  30.0 - 36.0 g/dL   RDW 13.5  11.5 - 15.5 %   Platelets 141 (*) 150 - 400 K/uL   Neutrophils Relative % 83 (*) 43 - 77 %   Neutro Abs 6.2  1.7 - 7.7 K/uL   Lymphocytes Relative 8 (*) 12 - 46 %   Lymphs Abs 0.6 (*) 0.7 - 4.0 K/uL   Monocytes Relative 8  3 - 12 %   Monocytes Absolute 0.6  0.1 - 1.0 K/uL   Eosinophils Relative 1  0 - 5 %   Eosinophils Absolute 0.1  0.0 - 0.7 K/uL   Basophils Relative 0  0 - 1 %   Basophils Absolute 0.0  0.0 - 0.1 K/uL  PROTIME-INR     Status: None   Collection Time    10/01/13  4:54 AM      Result Value Ref Range   Prothrombin Time 12.5  11.6 - 15.2 seconds   INR 0.93  0.00 - 2.12  BASIC METABOLIC PANEL     Status: Abnormal   Collection Time    10/01/13  4:54 AM      Result Value Ref Range   Sodium 140  137 - 147 mEq/L   Potassium 4.2  3.7 - 5.3 mEq/L   Chloride 101  96 -  112 mEq/L   CO2 26  19 - 32 mEq/L   Glucose, Bld 156 (*) 70 - 99 mg/dL   BUN 26 (*) 6 - 23 mg/dL   Creatinine, Ser 1.16  0.50 - 1.35 mg/dL   Calcium 8.7  8.4 - 10.5 mg/dL   GFR calc non Af Amer 55 (*) >90 mL/min   GFR calc Af Amer 63 (*) >90 mL/min   Comment: (NOTE)     The eGFR has been calculated using the CKD EPI equation.     This calculation has not been validated in all clinical situations.     eGFR's persistently <90 mL/min signify possible Chronic Kidney     Disease.   Anion gap 13  5 - 15    Ct Head Wo Contrast  10/01/2013   CLINICAL DATA:  Possible unwitnessed fall. Left periorbital hematoma.  EXAM: CT HEAD WITHOUT CONTRAST  CT MAXILLOFACIAL WITHOUT CONTRAST  CT CERVICAL SPINE WITHOUT CONTRAST  TECHNIQUE: Multidetector CT imaging of the head, cervical spine, and maxillofacial structures were performed using the standard protocol without intravenous contrast. Multiplanar CT image reconstructions of the cervical spine and maxillofacial structures were also generated.  COMPARISON:  CT of the head and cervical spine September 26, 2013  FINDINGS: CT HEAD FINDINGS  3 mm right parafalcine acute subdural hematoma. In addition,  3 mm left middle cranial fossa subdural hematoma appears acute. No intraparenchymal hemorrhage, mass effect or midline shift.  Moderate ventriculomegaly, likely on the basis of global parenchymal brain volume loss as there is overall commensurate enlargement of cerebral sulci and cerebellar folia, similar. Patchy to confluent supratentorial white matter hypodensities. No acute large vascular territory infarct. Basal cisterns are patent. Severe calcific atherosclerosis the carotid siphons. Nondisplaced fracture of the left dx greater wing of the sphenoid.  CT MAXILLOFACIAL FINDINGS  Minimally depressed left anterior maxillary wall fracture, segmental a mildly depressed left posterolateral maxillary wall fracture. Segmental depressed left zygomatic arch fracture.  Nondisplaced left lateral orbital wall and rim fracture. Left orbital floor fracture with 2 mm of depressed bony fragments, minimal external herniation of extraconal fat.  Ocular globes intact. Extraocular muscles located without hematoma. Preservation of the retrobulbar fat. Optic nerve sheath complexes are unremarkable. Status post bilateral ocular lens implants. Mild left periorbital soft tissue swelling without subcutaneous gas or radiopaque foreign bodies.  Layering hyperdense blood products in left maxillary sinus, minimal right maxillary and sphenoid air-fluid level of paranasal sinus mucosal thickening. No destructive bony lesions. Patient is edentulous. The mandible is intact and the condyles located.  CT CERVICAL SPINE FINDINGS  Cervical vertebral bodies are intact and aligned with straightened cervical lordosis. Severe C4-5 through C6-7 degenerative disc disease, mild at C3-4. C1-2 articulation maintained with moderate arthropathy. Bone mineral density is decreased without destructive bony lesions.  Mild chronic T2 compression fracture. Mild calcific atherosclerosis of the carotid bulbs. Severe right greater than left sternoclavicular  osteoarthrosis.  IMPRESSION: CT head: Nondisplaced left greater wing of the sphenoid fracture with 3 mm left middle cranial fossa subdural hematoma, 3 mm right parafalcine subdural hematomas.  No intraparenchymal hemorrhage. Involutional changes. Moderate white matter changes suggest chronic small vessel ischemic disease.  Maxillofacial CT: Left zygomaticomaxillary complex fracture. No postseptal hematoma.  CT cervical spine: Straightened cervical lordosis without acute fracture nor malalignment.  Critical Value/emergent results were called by telephone at the time of interpretation on 10/01/2013 at 4:33 AM to Dr. OLGA OTTER , who verbally acknowledged these results.   Electronically Signed   By: Courtnay    Bloomer   On: 10/01/2013 04:34   Ct Cervical Spine Wo Contrast  10/01/2013   CLINICAL DATA:  Possible unwitnessed fall. Left periorbital hematoma.  EXAM: CT HEAD WITHOUT CONTRAST  CT MAXILLOFACIAL WITHOUT CONTRAST  CT CERVICAL SPINE WITHOUT CONTRAST  TECHNIQUE: Multidetector CT imaging of the head, cervical spine, and maxillofacial structures were performed using the standard protocol without intravenous contrast. Multiplanar CT image reconstructions of the cervical spine and maxillofacial structures were also generated.  COMPARISON:  CT of the head and cervical spine September 26, 2013  FINDINGS: CT HEAD FINDINGS  3 mm right parafalcine acute subdural hematoma. In addition, 3 mm left middle cranial fossa subdural hematoma appears acute. No intraparenchymal hemorrhage, mass effect or midline shift.  Moderate ventriculomegaly, likely on the basis of global parenchymal brain volume loss as there is overall commensurate enlargement of cerebral sulci and cerebellar folia, similar. Patchy to confluent supratentorial white matter hypodensities. No acute large vascular territory infarct. Basal cisterns are patent. Severe calcific atherosclerosis the carotid siphons. Nondisplaced fracture of the left dx greater wing of the  sphenoid.  CT MAXILLOFACIAL FINDINGS  Minimally depressed left anterior maxillary wall fracture, segmental a mildly depressed left posterolateral maxillary wall fracture. Segmental depressed left zygomatic arch fracture.  Nondisplaced left lateral orbital wall and rim fracture. Left orbital floor fracture with 2 mm of depressed bony fragments, minimal external herniation of extraconal fat.  Ocular globes intact. Extraocular muscles located without hematoma. Preservation of the retrobulbar fat. Optic nerve sheath complexes are unremarkable. Status post bilateral ocular lens implants. Mild left periorbital soft tissue swelling without subcutaneous gas or radiopaque foreign bodies.  Layering hyperdense blood products in left maxillary sinus, minimal right maxillary and sphenoid air-fluid level of paranasal sinus mucosal thickening. No destructive bony lesions. Patient is edentulous. The mandible is intact and the condyles located.  CT CERVICAL SPINE FINDINGS  Cervical vertebral bodies are intact and aligned with straightened cervical lordosis. Severe C4-5 through C6-7 degenerative disc disease, mild at C3-4. C1-2 articulation maintained with moderate arthropathy. Bone mineral density is decreased without destructive bony lesions.  Mild chronic T2 compression fracture. Mild calcific atherosclerosis of the carotid bulbs. 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  IMPRESSION: CT head: Nondisplaced left greater wing of the sphenoid fracture with 3 mm left middle cranial fossa subdural hematoma, 3 mm right parafalcine subdural hematomas.  No intraparenchymal hemorrhage. Involutional changes. Moderate white matter changes suggest chronic small vessel ischemic disease.  Maxillofacial CT: Left zygomaticomaxillary complex fracture. No postseptal hematoma.  CT cervical spine: Straightened cervical lordosis without acute fracture nor malalignment.  Critical Value/emergent results were called by telephone at  the time of interpretation on 10/01/2013 at 4:33 AM to Dr. Linton Flemings , who verbally acknowledged these results.   Electronically Signed   By: Elon Alas   On: 10/01/2013 04:34   Dg Cerv Spine Flex&ext Only  10/01/2013   CLINICAL DATA:  pain S/P fall, CT neg, R/O instability  EXAM: CERVICAL SPINE - FLEXION AND EXTENSION VIEWS ONLY  COMPARISON:  10/01/2013  FINDINGS: Flexion and extension views show no significant antero or retrolisthesis. Degenerative changes are noted similar to that seen on the prior CT examination. No acute fracture is noted.  IMPRESSION: No instability is seen.   Electronically Signed   By: Inez Catalina M.D.   On: 10/01/2013 09:19   Ct Maxillofacial Wo Cm  10/01/2013   CLINICAL DATA:  Possible unwitnessed fall. Left periorbital hematoma.  EXAM: CT HEAD WITHOUT CONTRAST  CT MAXILLOFACIAL  WITHOUT CONTRAST  CT CERVICAL SPINE WITHOUT CONTRAST  TECHNIQUE: Multidetector CT imaging of the head, cervical spine, and maxillofacial structures were performed using the standard protocol without intravenous contrast. Multiplanar CT image reconstructions of the cervical spine and maxillofacial structures were also generated.  COMPARISON:  CT of the head and cervical spine September 26, 2013  FINDINGS: CT HEAD FINDINGS  3 mm right parafalcine acute subdural hematoma. In addition, 3 mm left middle cranial fossa subdural hematoma appears acute. No intraparenchymal hemorrhage, mass effect or midline shift.  Moderate ventriculomegaly, likely on the basis of global parenchymal brain volume loss as there is overall commensurate enlargement of cerebral sulci and cerebellar folia, similar. Patchy to confluent supratentorial white matter hypodensities. No acute large vascular territory infarct. Basal cisterns are patent. Severe calcific atherosclerosis the carotid siphons. Nondisplaced fracture of the left dx greater wing of the sphenoid.  CT MAXILLOFACIAL FINDINGS  Minimally depressed left anterior maxillary wall  fracture, segmental a mildly depressed left posterolateral maxillary wall fracture. Segmental depressed left zygomatic arch fracture.  Nondisplaced left lateral orbital wall and rim fracture. Left orbital floor fracture with 2 mm of depressed bony fragments, minimal external herniation of extraconal fat.  Ocular globes intact. Extraocular muscles located without hematoma. Preservation of the retrobulbar fat. Optic nerve sheath complexes are unremarkable. Status post bilateral ocular lens implants. Mild left periorbital soft tissue swelling without subcutaneous gas or radiopaque foreign bodies.  Layering hyperdense blood products in left maxillary sinus, minimal right maxillary and sphenoid air-fluid level of paranasal sinus mucosal thickening. No destructive bony lesions. Patient is edentulous. The mandible is intact and the condyles located.  CT CERVICAL SPINE FINDINGS  Cervical vertebral bodies are intact and aligned with straightened cervical lordosis. Severe C4-5 through C6-7 degenerative disc disease, mild at C3-4. C1-2 articulation maintained with moderate arthropathy. Bone mineral density is decreased without destructive bony lesions.  Mild chronic T2 compression fracture. Mild calcific atherosclerosis of the carotid bulbs. 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  IMPRESSION: CT head: Nondisplaced left greater wing of the sphenoid fracture with 3 mm left middle cranial fossa subdural hematoma, 3 mm right parafalcine subdural hematomas.  No intraparenchymal hemorrhage. Involutional changes. Moderate white matter changes suggest chronic small vessel ischemic disease.  Maxillofacial CT: Left zygomaticomaxillary complex fracture. No postseptal hematoma.  CT cervical spine: Straightened cervical lordosis without acute fracture nor malalignment.  Critical Value/emergent results were called by telephone at the time of interpretation on 10/01/2013 at 4:33 AM to Dr. OLGA OTTER , who verbally  acknowledged these results.   Electronically Signed   By: Courtnay  Bloomer   On: 10/01/2013 04:34    Review of Systems  Eyes: Negative.   Respiratory: Negative.   Cardiovascular: Negative.   Gastrointestinal: Positive for diarrhea and constipation.  Musculoskeletal: Positive for neck pain.  Skin: Negative.   Neurological: Positive for headaches.  Psychiatric/Behavioral: Negative.    Blood pressure 113/50, pulse 66, temperature 97.7 F (36.5 C), temperature source Oral, resp. rate 18, SpO2 96.00%. Physical Exam  Constitutional: He is oriented to person, place, and time. He appears well-developed and well-nourished.  Eyes: Pupils are equal, round, and reactive to light.  GI: Soft. Bowel sounds are normal.  Neurological: He is alert and oriented to person, place, and time. He has normal strength. GCS eye subscore is 4. GCS verbal subscore is 5. GCS motor subscore is 6.  Reflex Scores:      Tricep reflexes are 2+ on the right side and 2+ on the left   side.      Bicep reflexes are 2+ on the right side and 2+ on the left side.      Brachioradialis reflexes are 2+ on the right side and 2+ on the left side.      Patellar reflexes are 2+ on the right side and 2+ on the left side.      Achilles reflexes are 2+ on the right side and 2+ on the left side. Patient is awake and alert pupils are equal reactive cranial nerves are intact strength is 5 out of 5 in his upper and lower extremities with no evidence of pronator drift neck is slightly tender both midline and paraspinal    Assessment/Plan: 89 years and with a closed head injury after a fall out of bed he has a basilar skull fracture and what appears to be a thickened falx possibly could be a small parafalcine subdural. Clinically these are insignificant and did not require surgery. He is being seen by facial trauma he has had a preliminarily negative cervical spine CT when the neck pain resolves we can evaluate his flexion extension films.  Continued observation with repeat CT scan in the morning.  , P 10/01/2013, 9:33 AM      

## 2013-10-02 ENCOUNTER — Inpatient Hospital Stay (HOSPITAL_COMMUNITY): Payer: Medicare Other

## 2013-10-02 ENCOUNTER — Encounter (HOSPITAL_COMMUNITY): Payer: Self-pay

## 2013-10-02 DIAGNOSIS — D62 Acute posthemorrhagic anemia: Secondary | ICD-10-CM

## 2013-10-02 DIAGNOSIS — S069X9A Unspecified intracranial injury with loss of consciousness of unspecified duration, initial encounter: Secondary | ICD-10-CM

## 2013-10-02 DIAGNOSIS — S02109A Fracture of base of skull, unspecified side, initial encounter for closed fracture: Secondary | ICD-10-CM

## 2013-10-02 DIAGNOSIS — S069XAA Unspecified intracranial injury with loss of consciousness status unknown, initial encounter: Secondary | ICD-10-CM

## 2013-10-02 DIAGNOSIS — S0292XA Unspecified fracture of facial bones, initial encounter for closed fracture: Secondary | ICD-10-CM

## 2013-10-02 DIAGNOSIS — S020XXA Fracture of vault of skull, initial encounter for closed fracture: Secondary | ICD-10-CM

## 2013-10-02 DIAGNOSIS — W19XXXA Unspecified fall, initial encounter: Secondary | ICD-10-CM

## 2013-10-02 DIAGNOSIS — S0181XA Laceration without foreign body of other part of head, initial encounter: Secondary | ICD-10-CM

## 2013-10-02 HISTORY — DX: Acute posthemorrhagic anemia: D62

## 2013-10-02 HISTORY — DX: Unspecified fracture of facial bones, initial encounter for closed fracture: S02.92XA

## 2013-10-02 HISTORY — DX: Fracture of base of skull, unspecified side, initial encounter for closed fracture: S02.109A

## 2013-10-02 LAB — BASIC METABOLIC PANEL
ANION GAP: 10 (ref 5–15)
BUN: 22 mg/dL (ref 6–23)
CO2: 26 mEq/L (ref 19–32)
CREATININE: 1.02 mg/dL (ref 0.50–1.35)
Calcium: 8.4 mg/dL (ref 8.4–10.5)
Chloride: 103 mEq/L (ref 96–112)
GFR, EST AFRICAN AMERICAN: 74 mL/min — AB (ref >90)
GFR, EST NON AFRICAN AMERICAN: 64 mL/min — AB (ref >90)
Glucose, Bld: 112 mg/dL — ABNORMAL HIGH (ref 70–99)
Potassium: 4.9 mEq/L (ref 3.7–5.3)
Sodium: 139 mEq/L (ref 137–147)

## 2013-10-02 LAB — CBC
HCT: 33.2 % — ABNORMAL LOW (ref 39.0–52.0)
Hemoglobin: 10.8 g/dL — ABNORMAL LOW (ref 13.0–17.0)
MCH: 33.3 pg (ref 26.0–34.0)
MCHC: 32.5 g/dL (ref 30.0–36.0)
MCV: 102.5 fL — AB (ref 78.0–100.0)
PLATELETS: 147 10*3/uL — AB (ref 150–400)
RBC: 3.24 MIL/uL — ABNORMAL LOW (ref 4.22–5.81)
RDW: 13.6 % (ref 11.5–15.5)
WBC: 5.2 10*3/uL (ref 4.0–10.5)

## 2013-10-02 MED ORDER — MORPHINE SULFATE 2 MG/ML IJ SOLN
2.0000 mg | INTRAMUSCULAR | Status: DC | PRN
  Administered 2013-10-02 – 2013-10-04 (×5): 2 mg via INTRAVENOUS
  Filled 2013-10-02 (×6): qty 1

## 2013-10-02 MED ORDER — BIOTENE DRY MOUTH MT LIQD
15.0000 mL | Freq: Two times a day (BID) | OROMUCOSAL | Status: DC
Start: 2013-10-02 — End: 2013-10-06
  Administered 2013-10-02 – 2013-10-06 (×8): 15 mL via OROMUCOSAL

## 2013-10-02 MED ORDER — BACITRACIN ZINC 500 UNIT/GM EX OINT
TOPICAL_OINTMENT | Freq: Two times a day (BID) | CUTANEOUS | Status: DC
  Administered 2013-10-02 – 2013-10-05 (×6): via TOPICAL
  Administered 2013-10-05: 1 via TOPICAL
  Administered 2013-10-06: 09:00:00 via TOPICAL
  Filled 2013-10-02 (×2): qty 28.35

## 2013-10-02 MED ORDER — OXYCODONE HCL 5 MG PO TABS
2.5000 mg | ORAL_TABLET | ORAL | Status: DC | PRN
  Administered 2013-10-02 – 2013-10-04 (×5): 5 mg via ORAL
  Administered 2013-10-04 – 2013-10-05 (×5): 10 mg via ORAL
  Administered 2013-10-06 (×2): 5 mg via ORAL
  Filled 2013-10-02: qty 2
  Filled 2013-10-02: qty 1
  Filled 2013-10-02: qty 2
  Filled 2013-10-02: qty 1
  Filled 2013-10-02: qty 2
  Filled 2013-10-02 (×3): qty 1
  Filled 2013-10-02: qty 2
  Filled 2013-10-02: qty 1
  Filled 2013-10-02: qty 2
  Filled 2013-10-02: qty 1

## 2013-10-02 MED ORDER — ZOLPIDEM TARTRATE 5 MG PO TABS
5.0000 mg | ORAL_TABLET | Freq: Every evening | ORAL | Status: DC | PRN
  Administered 2013-10-05: 5 mg via ORAL
  Filled 2013-10-02: qty 1

## 2013-10-02 MED ORDER — PANTOPRAZOLE SODIUM 40 MG PO TBEC
40.0000 mg | DELAYED_RELEASE_TABLET | Freq: Two times a day (BID) | ORAL | Status: DC
  Administered 2013-10-02 – 2013-10-06 (×9): 40 mg via ORAL
  Filled 2013-10-02 (×8): qty 1

## 2013-10-02 MED ORDER — PANTOPRAZOLE SODIUM 40 MG IV SOLR
40.0000 mg | Freq: Two times a day (BID) | INTRAVENOUS | Status: DC
  Filled 2013-10-02 (×2): qty 40

## 2013-10-02 NOTE — Progress Notes (Addendum)
Thank you for consult on George Foster. Records and therapy evaluation note reviewed.  Chart reviewed and note that patient was a resident of SNF till d/c to ALF on 09/05/13? He has had recurrent falls since then and now has SDH with TBI. Have concerns about ability to safetly return to [place]. He will likely need SNF past discharge. Will defer CIR consult for now.

## 2013-10-02 NOTE — Progress Notes (Signed)
Patient ID: [name], male   DOB: 1924/10/20, 80 y.o.   MRN: [id number] Patient is doing well with mild headache mostly face pain referable pain left  Awake alert oriented strength is 5 out of 5 pupils are equal reactive cranial nerves are intact  Followup CT scan stable flexion extension films negative down to C7 patient clinically cleared and cervical collar discontinued.

## 2013-10-02 NOTE — Progress Notes (Signed)
Patient ID: George Foster, male   DOB: 09-Nov-1924, 80 y.o.   MRN: [id number]   LOS: 1 day   Subjective: Had significant pain this morning but better now after pain meds.   Objective: Vital signs in last 24 hours: Temp:  [97.9 F (36.6 C)-99.7 F (37.6 C)] 99.7 F (37.6 C) (07/14 0829) Pulse Rate:  [54-114] 64 (07/14 0800) Resp:  [12-21] 17 (07/14 0800) BP: (83-154)/(33-99) 132/56 mmHg (07/14 0800) SpO2:  [88 %-100 %] 88 % (07/14 0800) Weight:  [207 lb 7.3 oz (94.1 kg)] 207 lb 7.3 oz (94.1 kg) (07/14 0600)    Laboratory  CBC  Recent Labs  10/01/13 0454 10/02/13 0257  WBC 7.3 5.2  HGB 11.2* 10.8*  HCT 33.6* 33.2*  PLT 141* 147*   BMET  Recent Labs  10/01/13 0454 10/02/13 0257  NA 140 139  K 4.2 4.9  CL 101 103  CO2 26 26  GLUCOSE 156* 112*  BUN 26* 22  CREATININE 1.16 1.02  CALCIUM 8.7 8.4    Radiology Results CT HEAD WITHOUT CONTRAST  TECHNIQUE:  Contiguous axial images were obtained from the base of the skull  through the vertex without intravenous contrast.  COMPARISON: 10/01/2013.  FINDINGS:  Nondisplaced fracture of the left greater wing of the sphenoid  better delineated on the prior CT. Fracture lateral wall left orbit.  Preseptal hematoma. Left orbital floor fracture better delineated on  the prior exam.  Anterior left middle cranial fossa blood slightly less prominent  than on the prior exam. This may reflect subdural blood although a  small amount of subarachnoid blood or hemorrhagic contusion of the  anterior aspect of the left temporal lobe cannot be excluded.  No significant change small anterior right parafalcine subdural  hematoma.  Now noted is a tiny amount of dependent left intraventricular blood.  Small vessel disease type changes without CT evidence of large acute  infarct.  Global atrophy without hydrocephalus.  No intracranial mass lesion noted on this unenhanced exam.  Vascular calcifications.  Partial opacification right  sphenoid sinus air cell.  IMPRESSION:  Anterior left middle cranial fossa blood slightly less prominent  than on the prior exam. This may reflect subdural blood although a  small amount of subarachnoid blood or hemorrhagic contusion of the  anterior aspect of the left temporal lobe cannot be excluded.  No significant change small anterior right parafalcine subdural  hematoma.  Now noted is a tiny amount of dependent left intraventricular blood.  Nondisplaced fracture of the left greater wing of the sphenoid  better delineated on the prior CT. Fracture lateral wall left orbit.  Preseptal hematoma. Left orbital floor fracture better delineated on  the prior exam.  Electronically Signed  By: Chauncey Cruel M.D.  On: 10/02/2013 07:14   Physical Exam General appearance: alert and no distress Resp: clear to auscultation bilaterally Cardio: regular rate and rhythm GI: normal findings: bowel sounds normal and soft, non-tender Incision/Wound: C/D/I Neuro: A&Ox3 (2057, self-corrected quickly to 2015), PERRL   Assessment/Plan: Fall TBI w/SDH, BSF -- HCT improved, ST consult for cognition Facial fxs/lac -- Local care Multiple medical problems -- Home meds FEN -- Advance diet VTE -- SCD's Dispo -- PT/OT/ST, transfer to SDU    Lisette Abu, PA-C Pager: [phone] General Trauma PA Pager: [phone]  10/02/2013

## 2013-10-02 NOTE — Progress Notes (Signed)
Working with therapies. Recall [facility] consult. Will need better bed rail protection at facility. Patient examined and I agree with the assessment and plan  Georganna Skeans, MD, MPH, FACS Trauma: [phone] General Surgery: [phone]  10/02/2013 10:16 AM

## 2013-10-02 NOTE — Evaluation (Signed)
Physical Therapy Evaluation Patient Details Name: George Foster MRN: [id number] DOB: 08-18-1924 Today's Date: 10/02/2013   History of Present Illness  HPI: Patient is a nursing home resident with a history of dementia, chronic pain, and polypharmacy. He was seen in the ED after a fall on 09/26/2013. He fell out of bed again tonight while sleeping. He does not remember falling. Uncertain if he had loss of consciousness. He was able to ambulate at the scene out to the nursing station to get help. He complains of pain from facial lacerations around his left eye. He denies visual changes. He was brought to the emergency department as a nontrauma code activation. Workup demonstrates facial lacerations, parafalcine and L middle cranial fossa subdural hematomas, L sphenoid wing fracture, as well as L maxillary sinus, zygomatic arch, and orbital fractures.  Clinical Impression    Pt admitted with above. Pt currently with functional limitations due to the deficits listed below (see PT Problem List).  Pt will benefit from skilled PT to increase their independence and safety with mobility to allow discharge to the venue listed below.   Extremely anxious about falling out of bed when he sleeps; Can we look into a low-height bed?  Was able to walk to and from dining hall at [facility] prior to falls; Showing a functional decline; worth consideration of CIR to maximize independence and safety with mobility and return to PLOF      Follow Up Recommendations CIR;Other (comment) (Hopefully pt can build up to tolerating intensive therapies)    Equipment Recommendations  Other (comment) (TBD; I believe pt is already well-equipped)    Recommendations for Other Services Rehab consult     Precautions / Restrictions Precautions Precautions: Fall      Mobility  Bed Mobility Overal bed mobility: Needs Assistance Bed Mobility: Supine to Sit     Supine to sit: Min assist;HOB elevated      General bed mobility comments: Cues for technique and use of bedrails to elevate trunk; slow, movement with decr efficiency  Transfers Overall transfer level: Needs assistance Equipment used: 2 person hand held assist Transfers: Sit to/from Stand Sit to Stand: +2 physical assistance;Mod assist         General transfer comment: Posterior lean, needing bilateral support to translate center of mass over feet  Ambulation/Gait Ambulation/Gait assistance: +2 physical assistance Ambulation Distance (Feet):  (pivot steps bed to recliner) Assistive device: 1 person hand held assist Gait Pattern/deviations: Shuffle;Decreased step length - right;Decreased step length - left     General Gait Details: Initially planned to walk with chair pushed behind, but with more time sitting upright, pain in head increased significantly; Posterior lean continued with steps to chair  Stairs            Wheelchair Mobility    Modified Rankin (Stroke Patients Only)       Balance             Standing balance-Leahy Scale: Zero                               Pertinent Vitals/Pain Headache pain significant with mobility; so much so that we stopped amb Did not rate, but likely close to 10/10 patient repositioned for comfort and RN and Trauma  notified    Home Living Family/patient expects to be discharged to:: Assisted living  Home Equipment: Walker - 2 wheels;Grab bars - toilet;Grab bars - tub/shower;Shower seat - built in      Prior Function Level of Independence: Independent with assistive device(s)         Comments: walks to dining ahll with RW     Hand Dominance        Extremity/Trunk Assessment   Upper Extremity Assessment: Generalized weakness (Grossly limited bil shoulder flexion against gravity)           Lower Extremity Assessment: Generalized weakness         Communication   Communication: No difficulties  Cognition  Arousal/Alertness: Awake/alert Behavior During Therapy: WFL for tasks assessed/performed Overall Cognitive Status: Within Functional Limits for tasks assessed                      General Comments      Exercises        Assessment/Plan    PT Assessment Patient needs continued PT services  PT Diagnosis Difficulty walking;Abnormality of gait;Acute pain   PT Problem List Decreased strength;Decreased range of motion;Decreased activity tolerance;Decreased balance;Decreased mobility;Decreased coordination;Decreased knowledge of use of DME;Pain  PT Treatment Interventions DME instruction;Gait training;Functional mobility training;Therapeutic activities;Therapeutic exercise;Balance training;Neuromuscular re-education;Patient/family education;Cognitive remediation   PT Goals (Current goals can be found in the Care Plan section) Acute Rehab PT Goals Patient Stated Goal: Wants security that he won't fall when he sleeps PT Goal Formulation: Patient unable to participate in goal setting Time For Goal Achievement: 10/16/13 Potential to Achieve Goals: Fair    Frequency Min 3X/week   Barriers to discharge        Co-evaluation               End of Session Equipment Utilized During Treatment: Gait belt Activity Tolerance: Patient limited by pain Patient left: in chair;with call bell/phone within reach Nurse Communication: Mobility status         Time: 9767-3419 PT Time Calculation (min): 23 min   Charges:   PT Evaluation $Initial PT Evaluation Tier I: 1 Procedure PT Treatments $Therapeutic Activity: 8-22 mins   PT G Codes:          Quin Hoop 10/02/2013, 11:44 AM  Roney Marion, PT  Acute Rehabilitation Services Pager [phone] Office [phone]

## 2013-10-02 NOTE — Progress Notes (Signed)
During OT evaluation at 1105 while assessing oral care ADLs, George Foster became confused and agitated screaming to "get out of his house" and "how dare we enter without letting him know." When I walked in he did not recognize me despite prompting. After about 30 minutes I regained his trust and he allowed [place] to get him back to bed safely. Patient was placed in a Low Bed with non-skid padded mats to prevent injury. Patient is cooperative with me and apologetic. He was able to be reoriented and is currently asleep. Bed alarm on moderate sensitivity, bed in lowest position, mats in place and side rails up x3 for safety. Will continue to monitor and consider safety sitter if necessary.

## 2013-10-02 NOTE — Progress Notes (Signed)
UR completed.  Admitted from SNF so will likely return at d/c.   Sandi Mariscal, RN BSN Gem CCM Trauma/Neuro ICU Case Manager [phone]

## 2013-10-02 NOTE — Evaluation (Signed)
Occupational Therapy Evaluation Patient Details Name: George Foster MRN: [id number] DOB: 12/06/1924 Today's Date: 10/02/2013    History of Present Illness 80 yo male admitted from [facility] history of dementia, chronic pain, and polypharmacy. He was seen in the ED after a fall on 09/26/2013. He fell out of bed while sleeping. He was able to ambulate at the scene out to the nursing station to get help. Workup demonstrates facial lacerations, SAH, CHI TBI,  parafalcine and L middle cranial fossa subdural hematomas, L sphenoid wing fracture, as well as L maxillary sinus, zygomatic arch, and orbital fractures. Pt has been at facility for 10 days PTA to event.   Clinical Impression   PT admitted with s/p fall facial fx, TBI, and Lt cranial fossa SDH. Pt currently with functional limitiations due to the deficits listed below (see OT problem list). Pt from ALF pta. Pt will benefit from skilled OT to increase their independence and safety with adls and balance to allow discharge SNF. Ot to follow acutely for decr balance during ADLs.     Follow Up Recommendations  SNF;Supervision/Assistance - 24 hour    Equipment Recommendations  Other (comment) (defer SNF)    Recommendations for Other Services       Precautions / Restrictions Precautions Precautions: Fall      Mobility Bed Mobility Overal bed mobility: Needs Assistance Bed Mobility: Supine to Sit     Supine to sit: Min assist;HOB elevated     General bed mobility comments: in chair  Transfers Overall transfer level: Needs assistance Equipment used: 2 person hand held assist Transfers: Sit to/from Stand Sit to Stand: +2 physical assistance;Mod assist         General transfer comment: cues for hand placement, posterior lean with inital standing, required hand held (A)    Balance Overall balance assessment: Needs assistance Sitting-balance support: Bilateral upper extremity supported;Feet supported Sitting  balance-Leahy Scale: Poor   Postural control: Posterior lean Standing balance support: Bilateral upper extremity supported;During functional activity Standing balance-Leahy Scale: Poor                              ADL Overall ADL's : Needs assistance/impaired     Grooming: Oral care;Maximal assistance Grooming Details (indicate cue type and reason): Pt refusing to complete task and began to yell "get out of my house"                 Toilet Transfer: +2 for physical assistance;Moderate assistance;BSC Toilet Transfer Details (indicate cue type and reason): Pt requires cues for hand placement and safety         Functional mobility during ADLs: +2 for physical assistance;Maximal assistance General ADL Comments: Pt sitting in recliner and agreeable to ambulation to sink level. pt very pleasant on arrival. pt became very agitated and implusive at sink level. pt became very disoriented and insisted that therapist leave patient personal home. pt required calm techniques of no tactile input, whisper voice, and decr lighting/ sound into the room. Pt returning to chair level with 10 minutes coaching.      Vision                 Additional Comments: Pt does not track therapist in the room and remains with static stare.   Perception     Praxis      Pertinent Vitals/Pain decr oxygen while resting to 90 % RA  Hand Dominance Right   Extremity/Trunk Assessment Upper Extremity Assessment Upper Extremity Assessment: Generalized weakness   Lower Extremity Assessment Lower Extremity Assessment: Defer to PT evaluation   Cervical / Trunk Assessment Cervical / Trunk Assessment: Kyphotic   Communication Communication Communication: No difficulties   Cognition Arousal/Alertness: Awake/alert Behavior During Therapy: Anxious;Agitated;Restless;Impulsive Overall Cognitive Status: History of cognitive impairments - at baseline                      General Comments       Exercises       Shoulder Instructions      Home Living Family/patient expects to be discharged to:: Assisted living                             Home Equipment: Walker - 2 wheels;Grab bars - toilet;Grab bars - tub/shower;Shower seat - built in   Additional Comments: pt recently admitted to ALF in last 10 days      Prior Functioning/Environment Level of Independence: Independent with assistive device(s)        Comments: walks to dining ahll with RW    OT Diagnosis: Generalized weakness;Cognitive deficits;Acute pain   OT Problem List: Decreased strength;Decreased activity tolerance;Impaired balance (sitting and/or standing);Decreased safety awareness;Decreased knowledge of use of DME or AE;Decreased cognition;Impaired vision/perception;Pain   OT Treatment/Interventions: Self-care/ADL training;Therapeutic exercise;DME and/or AE instruction;Therapeutic activities;Cognitive remediation/compensation;Visual/perceptual remediation/compensation;Patient/family education;Balance training    OT Goals(Current goals can be found in the care plan section) Acute Rehab OT Goals Patient Stated Goal: pt said a prayer to the lord that staff would leave the room. No actual goals were stated and cognitive deficits present OT Goal Formulation: Patient unable to participate in goal setting Time For Goal Achievement: 10/16/13 Potential to Achieve Goals: Good  OT Frequency: Min 2X/week   Barriers to D/C:            Co-evaluation              End of Session Equipment Utilized During Treatment: Gait belt Nurse Communication: Mobility status;Precautions  Activity Tolerance: Treatment limited secondary to agitation Patient left: in chair;with call bell/phone within reach;with nursing/sitter in room   Time: 2458-0998 OT Time Calculation (min): 30 min Charges:  OT General Charges $OT Visit: 1 Procedure OT Evaluation $Initial OT Evaluation Tier I: 1  Procedure OT Treatments $Self Care/Home Management : 23-37 mins G-Codes:    Peri Maris 2013-10-28, 2:08 PM Pager: [phone]

## 2013-10-02 NOTE — Progress Notes (Signed)
Pt transferred to 3S13 via bed. Pt tolerated transfer without difficulty. VSS. Pt still with mild c/o pain-recently given 5 mg oxycodone. Pt's wife aware of transfer. Pt's clothes were only belongings in room and were transferred with pt. Bedside handoff with Roselyn Reef, RN. Dellie Catholic, RN

## 2013-10-02 NOTE — Evaluation (Signed)
Called to evaluate George Foster following a fall and striking his face with multiple facial fractures as well as a basilar skull fracture and subdural hematoma.  The maxillofacial CT scan was reviewed and revealed multiple minimal to moderately displaced midfacial fractures including an orbital floor with no entrapment of the EOMs.    With minimal displacement of the fractures and no functional impairment secondary to the facial fractures, no surgical intervention is recommended.

## 2013-10-02 NOTE — Progress Notes (Signed)
Rehab Admissions Coordinator Note:  Patient was screened by Grasiela Jonsson L for appropriateness for an Inpatient Acute Rehab Consult.  At this time, we are recommending Inpatient Rehab consult.  Jessejames Steelman, PT Rehabilitation Admissions Coordinator [phone]  

## 2013-10-03 ENCOUNTER — Inpatient Hospital Stay (HOSPITAL_COMMUNITY): Payer: Medicare Other

## 2013-10-03 DIAGNOSIS — R404 Transient alteration of awareness: Secondary | ICD-10-CM

## 2013-10-03 DIAGNOSIS — R410 Disorientation, unspecified: Secondary | ICD-10-CM | POA: Diagnosis not present

## 2013-10-03 LAB — URINALYSIS, ROUTINE W REFLEX MICROSCOPIC
Bilirubin Urine: NEGATIVE
GLUCOSE, UA: NEGATIVE mg/dL
KETONES UR: 15 mg/dL — AB
Nitrite: NEGATIVE
Protein, ur: NEGATIVE mg/dL
Specific Gravity, Urine: 1.017 (ref 1.005–1.030)
UROBILINOGEN UA: 0.2 mg/dL (ref 0.0–1.0)
pH: 5.5 (ref 5.0–8.0)

## 2013-10-03 LAB — URINE MICROSCOPIC-ADD ON

## 2013-10-03 MED ORDER — SULFAMETHOXAZOLE-TMP DS 800-160 MG PO TABS
1.0000 | ORAL_TABLET | Freq: Two times a day (BID) | ORAL | Status: DC
  Administered 2013-10-03 – 2013-10-06 (×7): 1 via ORAL
  Filled 2013-10-03 (×7): qty 1

## 2013-10-03 MED ORDER — TRAMADOL HCL 50 MG PO TABS
100.0000 mg | ORAL_TABLET | Freq: Four times a day (QID) | ORAL | Status: DC
  Administered 2013-10-03 – 2013-10-06 (×12): 100 mg via ORAL
  Filled 2013-10-03 (×13): qty 2

## 2013-10-03 NOTE — Evaluation (Signed)
Speech Language Pathology Evaluation Patient Details Name: George Foster MRN: [id number] DOB: December 26, 1924 Today's Date: 10/03/2013 Time: 3299-2426 SLP Time Calculation (min): 25 min  Problem List:  Patient Active Problem List   Diagnosis Date Noted  . Acute delirium 10/03/2013  . Fall 10/02/2013  . Multiple facial fractures 10/02/2013  . Facial laceration 10/02/2013  . Basilar skull fracture 10/02/2013  . Acute blood loss anemia 10/02/2013  . Traumatic subdural hematoma 10/01/2013  . BPH (benign prostatic hyperplasia) 02/17/2013  . Osteoarthritis cervical spine 02/17/2013  . Chronic constipation   . Chronic back pain 06/08/2011  . Toxic encephalopathy 06/06/2011  . E. coli urinary tract infection 06/06/2011  . Dementia with behavioral disturbance 06/06/2011  . Wounds, multiple 06/06/2011  . Suicidal ideation 06/06/2011  . Hypertension   . Hypothyroidism   . Gastroesophageal reflux disease   . Anxiety disorder   . Polypharmacy    Past Medical History:  Past Medical History  Diagnosis Date  . Dementia   . Hypertension   . Arthritis   . Hypothyroidism   . Chronic back pain   . Anxiety disorder   . Gastroesophageal reflux disease   . Polypharmacy     History of hospitalizations for altered mental status related to medications  . Chronic constipation     with fecal impactions, recurrent   Past Surgical History:  Past Surgical History  Procedure Laterality Date  . Appendectomy    . Removal of nonrestorable teeth numbers 2, 4, 6, 12, 13, 18, 29     HPI:  Patient is a nursing home resident with a history of dementia, chronic pain, and polypharmacy. He was seen in the ED after a fall on 09/26/2013. He fell out of bed again tonight while sleeping. He does not remember falling. Uncertain if he had loss of consciousness. He was able to ambulate at the scene out to the nursing station to get help. He complains of pain from facial lacerations around his left eye. He denies  visual changes. He was brought to the emergency department as a nontrauma code activation. Workup demonstrates facial lacerations, parafalcine and L middle cranial fossa subdural hematomas, L sphenoid wing fracture, as well as L maxillary sinus, zygomatic arch, and orbital fractures.    Assessment / Plan / Recommendation Clinical Impression  At bedside, pt demonstrated cognitive and receptive language deficits: disorientation to time and situation, decreased functional problem solving, decreased recall of new information/ ST memory, can follow 1-step directions but not 2-step. While at bedside pt reported head pain which may have had a negative impact on performance during eval; RN informed. RN reported that wife has been in visiting every day since admission; however pt does not recall visits and is anxious about wife. Also presenting with mildly slurred speech- intelligibility slightly reduced. Pt with baseline dementia; family not at bedside during eval but was able to speak with them later to confirm that cognitive skills are currently different from baseline level- wife reports increased confusion and that she is having a hard time understanding his speech- that it is slurred and that he is saying things that do not make sense, which is reportedly different from baseline. Concerns for pt safety regarding current decreased safety awareness, problem solving, and orientation level. ST will continue to follow for cognitive treatment addressing these skills.    SLP Assessment  Patient needs continued Speech Lanaguage Pathology Services    Follow Up Recommendations  Inpatient Rehab    Frequency and Duration min 2x/week  2 weeks   Pertinent Vitals/Pain n/a   SLP Goals  SLP Goals Potential to Achieve Goals: Fair Potential Considerations: Previous level of function  SLP Evaluation Prior Functioning  Cognitive/Linguistic Baseline: Baseline deficits Baseline deficit details: baseline  dementia Type of Home: Skilled Nursing Facility   Cognition  Overall Cognitive Status: No family/caregiver present to determine baseline cognitive functioning Arousal/Alertness: Awake/alert Orientation Level: Oriented to person;Oriented to place;Disoriented to time;Disoriented to situation Attention: Selective Selective Attention: Appears intact Memory: Impaired Memory Impairment: Decreased short term memory;Decreased recall of new information Decreased Short Term Memory: Functional basic;Verbal basic Problem Solving: Impaired Problem Solving Impairment: Verbal complex    Comprehension  Auditory Comprehension Overall Auditory Comprehension: Impaired Yes/No Questions: Within Functional Limits Commands: Impaired One Step Basic Commands: 75-100% accurate Two Step Basic Commands: 25-49% accurate Conversation: Simple Interfering Components: Pain EffectiveTechniques: Repetition;Slowed speech;Visual/Gestural cues Reading Comprehension Reading Status: Not tested    Expression Expression Primary Mode of Expression: Verbal Verbal Expression Overall Verbal Expression: Impaired Initiation: No impairment Level of Generative/Spontaneous Verbalization: Conversation Naming: No impairment Non-Verbal Means of Communication: Not applicable Other Verbal Expression Comments: Occasional phrases not making sense Written Expression Written Expression: Not tested   Oral / Motor Oral Motor/Sensory Function Overall Oral Motor/Sensory Function: Appears within functional limits for tasks assessed Motor Speech Overall Motor Speech: Impaired Articulation: Impaired Level of Impairment: Conversation Intelligibility: Intelligibility reduced Word: 75-100% accurate Phrase: 75-100% accurate Sentence: 75-100% accurate Conversation: 75-100% accurate Motor Planning: Witnin functional limits   [employer], Lashawnda Hancox K, MA, CCC-SLP 10/03/2013, 11:41 AM
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
## 2013-10-03 NOTE — Progress Notes (Signed)
Patient ID: George Foster, male   DOB: 10/29/1924, 80 y.o.   MRN: [id number]   LOS: 2 days   Subjective: Confused and agitated overnight per RN. Describes delirious state for me this morning. No change in physical c/o.   Objective: Vital signs in last 24 hours: Temp:  [97.7 F (36.5 C)-98.9 F (37.2 C)] 98.1 F (36.7 C) (07/15 0739) Pulse Rate:  [57-71] 57 (07/15 0858) Resp:  [9-21] 11 (07/15 0858) BP: (102-169)/(41-75) 138/56 mmHg (07/15 0739) SpO2:  [86 %-98 %] 94 % (07/15 0858)    Physical Exam General appearance: alert, no distress and Ox1 (house, 2006) Resp: clear to auscultation bilaterally Cardio: regular rate and rhythm GI: normal findings: bowel sounds normal and soft, non-tender Incision/Wound:Left periorbital edema improved, pupils =, EOM's intact   Assessment/Plan: Fall  TBI w/SDH, BSF -- HCT improved, ST consult for cognition  Facial fxs/lac -- Local care  Delirium -- Check UA, add scheduled tramadol to reduce narcotic need as this is most likely source Multiple medical problems -- Home meds  FEN -- Advance diet  VTE -- Will d/c SCD's today as RN reports contributing to agitation but will need to be added back in tomorrow Dispo -- PT/OT/ST, continue SDU with delirium    Lisette Abu, PA-C Pager: [phone] General Trauma PA Pager: [phone]  10/03/2013

## 2013-10-04 DIAGNOSIS — N390 Urinary tract infection, site not specified: Secondary | ICD-10-CM | POA: Diagnosis not present

## 2013-10-04 NOTE — Progress Notes (Signed)
CSW spoke with pt's legal guardian, Williemae Natter, via telephone.  Per Ms. Rosana Berger, pt had been at [facility] x 10 days (PTA) since his d/c from [facility] where he had been x 1 year.  Pt was d/c of [facility] as he needed a lower LOC.  Ms. Rosana Berger prefers that pt returns to his ALF(with enhanced care) at d/c as opposed to going to SNF, if possible. If [facility] refuses pt's return, SNF will will be acceptable with Ms. Rosana Berger.  CSW will f/u with [facility] re: pt's progress with PT/ability to return there at d/c.

## 2013-10-04 NOTE — Progress Notes (Signed)
OT Cancellation Note  Patient Details Name: George Foster MRN: [id number] DOB: 08/12/1924   Cancelled Treatment:    Reason Eval/Treat Not Completed: Other (comment) Attempted to see this pm. Pt in process of being transferred to new room Will see in am. [place], OTR/L  [phone] 10/04/2013 10/04/2013, 4:47 PM

## 2013-10-04 NOTE — Progress Notes (Signed)
CSW met with pt to complete SBIRT assesment. Will continue to follow regarding disposition. CSW spoke with [facility] to update.   [address] [place]

## 2013-10-04 NOTE — Progress Notes (Signed)
This patient has been seen and I agree with the findings and treatment plan.  Yolanda Dockendorf O. Jamael Hoffmann, III, MD, FACS [phone] (pager) [phone] (direct pager) Trauma Surgeon  

## 2013-10-04 NOTE — Progress Notes (Signed)
Patient ID: KREW HORTMAN, male   DOB: 1924/05/09, 80 y.o.   MRN: [id number]   LOS: 3 days   Subjective: Feeling better but still with significant pain.   Objective: Vital signs in last 24 hours: Temp:  [97.3 F (36.3 C)-98.1 F (36.7 C)] 97.7 F (36.5 C) (07/16 0751) Pulse Rate:  [57-72] 72 (07/16 0751) Resp:  [11-22] 14 (07/16 0751) BP: (129-166)/(57-75) 149/75 mmHg (07/16 0751) SpO2:  [86 %-97 %] 93 % (07/16 0751)    Physical Exam General appearance: alert and no distress Resp: clear to auscultation bilaterally Cardio: regular rate and rhythm GI: normal findings: bowel sounds normal and soft, non-tender Neuro: Ox3   Assessment/Plan: Fall  TBI w/SDH, BSF -- HCT improved, ST consult for cognition  Facial fxs/lac -- Local care  Delirium -- Much improved, whether from decreased narcotics or tx of UTI I don't know UTI -- UA equivocal but will treat presumptively given delirium, Septra D2/5 Multiple medical problems -- Home meds  FEN -- No issues VTE -- SCD's  Dispo -- PT/OT/ST, transfer to floor    Lisette Abu, PA-C Pager: [phone] General Trauma PA Pager: [phone]  10/04/2013

## 2013-10-04 NOTE — Progress Notes (Signed)
Report received from 3S RN. All questions answered.

## 2013-10-04 NOTE — Progress Notes (Signed)
PT Cancellation Note  Patient Details Name: George Foster MRN: [id number] DOB: [birth year]   Cancelled Treatment:    Reason Eval/Treat Not Completed: Other (comment) (pt currently with nsg tech for bathing, will try back)   Duncan Dull 10/04/2013, 9:39 AM Alben Deeds, PT DPT  [phone]

## 2013-10-05 NOTE — Clinical Social Work Placement (Addendum)
Clinical Social Work Department CLINICAL SOCIAL WORK PLACEMENT NOTE 10/05/2013  Patient:  George Foster, George Foster  Account Number:  [id number] Admit date:  10/01/2013  Clinical Social Worker:  Barbette Or, LCSW  Date/time:  10/05/2013 03:00 PM  Clinical Social Work is seeking post-discharge placement for this patient at the following level of care:   [place]   (*CSW will update this form in Epic as items are completed)   10/05/2013  Patient/family provided with [place] Department of Clinical Social Work's list of facilities offering this level of care within the geographic area requested by the patient (or if unable, by the patient's family).  10/05/2013  Patient/family informed of their freedom to choose among providers that offer the needed level of care, that participate in Medicare, Medicaid or managed care program needed by the patient, have an available bed and are willing to accept the patient.  10/05/2013  Patient/family informed of [facility]' ownership interest in [facility], as well as of the fact that they are under no obligation to receive care at this facility.  PASARR submitted to EDS on 10/05/2013 PASARR number received on 10/05/2013  FL2 transmitted to all facilities in geographic area requested by pt/family on  10/05/2013 FL2 transmitted to all facilities within larger geographic area on   Patient informed that his/her managed care company has contracts with or will negotiate with  certain facilities, including the following:     Patient/family informed of bed offers received:  10/05/2013 Patient chooses bed at [facility] [place] Physician recommends and patient chooses bed at    Patient to be transferred to [facility] [place] on  10/06/2013 Patient to be transferred to facility by [facility] Patient and family notified of transfer on 10/06/13- Blima Rich, [place] Name of family member notified: Patient's wife George Foster and CSW left message with patient's guardian George Foster.Blima Rich, LCSWA    The following physician request were entered in Epic:   Additional Comments: Please leave a message for George Foster (patient guardian) with discharge information on day of discharge. [phone]

## 2013-10-05 NOTE — Progress Notes (Addendum)
Physical Therapy Treatment Patient Details Name: George Foster MRN: [id number] DOB: Jan 03, 1925 Today's Date: 10/05/2013    History of Present Illness 80 yo male admitted from [facility] history of dementia, chronic pain, and polypharmacy. He was seen in the ED after a fall on 09/26/2013. He fell out of bed while sleeping. He was able to ambulate at the scene out to the nursing station to get help. Workup demonstrates facial lacerations, SAH, CHI TBI,  parafalcine and L middle cranial fossa subdural hematomas, L sphenoid wing fracture, as well as L maxillary sinus, zygomatic arch, and orbital fractures. Pt has been at facility for 10 days PTA to event.    PT Comments    Pt continues to be confused and disoriented to situation. During therapy session pt was pleasant and agreeable to all therapy and mobilization. Spoke with CSW regarding D/C disposition; family wanting pt to return to ALF. Pt will require 2 person (A) for all mobility and transfers at this time. Pt is a high fall risk due to decr cognition and decr safety awareness. Will cont to follow per POC.   Follow Up Recommendations  Other (comment) (SNF vs ALF pending (A) available; spoke with CSW re (A) leve)     Equipment Recommendations  Other (comment) (TBD at next venue)    Recommendations for Other Services Rehab consult     Precautions / Restrictions Precautions Precautions: Fall Restrictions Weight Bearing Restrictions: No    Mobility  Bed Mobility Overal bed mobility: Needs Assistance Bed Mobility: Supine to Sit     Supine to sit: Min guard     General bed mobility comments: Min guard for safety; use of handrails and HOB elevated  Transfers Overall transfer level: Needs assistance Equipment used: 2 person hand held assist Transfers: Sit to/from Stand Sit to Stand: +2 physical assistance;Mod assist         General transfer comment: pt with heavy lean posteriorly and bracing LEs on bed; c/o  dizziness initially; stood with deep breathing cues ~2 min prior to ambulating; incr fear of falling; cues to incr BOS and upright posture   Ambulation/Gait Ambulation/Gait assistance: +2 physical assistance;Mod assist Ambulation Distance (Feet): 14 Feet Assistive device: 2 person hand held assist Gait Pattern/deviations: Step-through pattern;Decreased stride length;Scissoring;Shuffle;Narrow base of support;Trunk flexed;Leaning posteriorly Gait velocity: very decreased Gait velocity interpretation: Below normal speed for age/gender General Gait Details: pt with heavy lean posteriorly and requries mod (A) of 2 to mobilize; max cues to increase BOS and remain upright; use of gt belt to support weight of pt; pt fatigued easily and requesting return to chair due to fatigue    Stairs            Wheelchair Mobility    Modified Rankin (Stroke Patients Only)       Balance Overall balance assessment: Needs assistance;History of Falls Sitting-balance support: Feet supported;No upper extremity supported Sitting balance-Leahy Scale: Fair Sitting balance - Comments: tolerated sitting EOB ~5 min prior to transfer to resolve dizziness  Postural control: Posterior lean Standing balance support: Bilateral upper extremity supported;During functional activity Standing balance-Leahy Scale: Zero Standing balance comment: 2 person (A) with use of gt belt for standing balance; bracing LEs on bed and heavy posterior lean                    Cognition Arousal/Alertness: Awake/alert Behavior During Therapy: WFL for tasks assessed/performed Overall Cognitive Status: No family/caregiver present to determine baseline cognitive functioning Area of Impairment:  Orientation;Safety/judgement;Problem solving;Following commands Orientation Level: Disoriented to;Time;Situation   Memory: Decreased short-term memory Following Commands: Follows one step commands with increased time Safety/Judgement:  Decreased awareness of deficits;Decreased awareness of safety   Problem Solving: Slow processing;Decreased initiation;Difficulty sequencing;Requires verbal cues;Requires tactile cues      Exercises      General Comments        Pertinent Vitals/Pain "i have pain all over"; pt unable to rate or pinpoint pain     Home Living                      Prior Function            PT Goals (current goals can now be found in the care plan section) Acute Rehab PT Goals Patient Stated Goal: to have better balance PT Goal Formulation: Patient unable to participate in goal setting Time For Goal Achievement: 10/16/13 Potential to Achieve Goals: Fair Progress towards PT goals: Progressing toward goals    Frequency  Min 3X/week    PT Plan Current plan remains appropriate    Co-evaluation PT/OT/SLP Co-Evaluation/Treatment: Yes Reason for Co-Treatment: For patient/therapist safety;Complexity of the patient's impairments (multi-system involvement) PT goals addressed during session: Mobility/safety with mobility;Balance;Proper use of DME       End of Session Equipment Utilized During Treatment: Gait belt Activity Tolerance: Patient limited by fatigue Patient left: in chair;with call bell/phone within reach;with chair alarm set;Other (comment) (OT in room for additional session)     Time: [phone] PT Time Calculation (min): 24 min  Charges:  $Gait Training: 8-22 mins                    G Codes:      Elie Confer [place], [place]  [phone] 10/05/2013, 11:57 AM

## 2013-10-05 NOTE — Progress Notes (Signed)
UR completed.  Floria Brandau, RN BSN [facility] CCM Trauma/Neuro ICU Case Manager [phone]  
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
## 2013-10-05 NOTE — Clinical Social Work Note (Signed)
Clinical Social Work Department BRIEF PSYCHOSOCIAL ASSESSMENT 10/05/2013  Patient:  DEWAINE, MOROCHO     Account Number:  [id number]     Admit date:  10/01/2013  Clinical Social Worker:  Ulyess Blossom  Date/Time:  10/04/2013 06:15 PM  Referred by:  Physician  Date Referred:  10/04/2013 Referred for  ALF Placement   Other Referral:   Interview type:  Other - See comment Other interview type:   [place] DATA Living Status:  FACILITY Admitted from facility:  [facility] Level of care:  Assisted Living Primary support name:  Williemae Natter  [phone] / [phone] Primary support relationship to patient:   Degree of support available:   Strong - patient with legal DSS guardian    CURRENT CONCERNS Current Concerns  Post-Acute Placement   Other Concerns:    SOCIAL WORK ASSESSMENT / PLAN CSW spoke with pt's legal guardian, Williemae Natter, via telephone.  Per Ms. Rosana Berger, pt had been at [facility]F x 10 days (PTA) since his d/c from [facility] SNF where he had been x 1 year.  Pt was d/c of [facility] as he needed a lower LOC.  Ms. Rosana Berger prefers that pt returns to his ALF(with enhanced care) at d/c as opposed to going to SNF, if possible. If [facility] refuses pt's return, SNF will will be acceptable with Ms. Rosana Berger.  CSW will f/u with [facility] re: pt's progress with PT/ability to return there at d/c.   Assessment/plan status:  Psychosocial Support/Ongoing Assessment of Needs Other assessment/ plan:   Information/referral to community resources:   Clinical Social Worker explained the possible necessity for SNF placement pending patient progress and ALF agreement for patient return - patient guardian in agreement.    PATIENT'S/FAMILY'S RESPONSE TO PLAN OF CARE: Patient alert and oriented x3 most of the time, however at times cannot state the year.  Patient with DSS guardian who assists with patient  placement needs.  Patient guardian supportive and realistic regarding patient potential needs at discharge.  Patient guardian aware and understanding of social work role.

## 2013-10-05 NOTE — Progress Notes (Signed)
Patient ID: George Foster, male   DOB: February 13, 1925, 80 y.o.   MRN: [id number] Awake alert confused at times but neurologically nonfocal  No new neurosurgical recommendations at this time will sign off repeat consult as needed.

## 2013-10-05 NOTE — Progress Notes (Signed)
Patient ID: ROMAINE NEVILLE, male   DOB: [birth year], 80 y.o.   MRN: [id number]   LOS: 4 days   Subjective: No new c/o.   Objective: Vital signs in last 24 hours: Temp:  [97.6 F (36.4 C)-98.4 F (36.9 C)] 97.6 F (36.4 C) (07/17 0929) Pulse Rate:  [6-78] 69 (07/17 0929) Resp:  [16-18] 18 (07/17 0929) BP: (104-151)/(59-85) 145/73 mmHg (07/17 0929) SpO2:  [95 %-98 %] 95 % (07/17 0929)    Physical Exam General appearance: alert and no distress Resp: clear to auscultation bilaterally Cardio: regular rate and rhythm GI: normal findings: bowel sounds normal and soft, non-tender   Assessment/Plan: Fall  TBI w/SDH, BSF -- HCT improved, ST consult for cognition  Facial fxs/lac -- Local care  Delirium -- Much improved though still having problems at night, whether from decreased narcotics or tx of UTI I don't know  UTI -- UA equivocal but will treat presumptively given delirium, Septra D3/5  Multiple medical problems -- Home meds  FEN -- No issues  VTE -- SCD's  Dispo -- PT/OT/ST, SNF when bed available    Lisette Abu, PA-C Pager: [phone] General Trauma PA Pager: [phone]  10/05/2013

## 2013-10-05 NOTE — Clinical Social Work Note (Addendum)
Clinical Social Worker spoke with patient, patient guardian, and patient wife to offer support and further discuss patient needs at discharge.  CSW spoke with [facility] who stated they could come to the hospital to assess patient for possible return on Monday.  Patient is medically ready and per therapy notes would benefit from ST-SNF prior to return to ALF.  CSW had a lengthy conversation with patient guardian who is in agreement with patient having a short term stay at [facility] before transition back to ALF.  Patient guardian provided permission to contact patient wife and provide update.  Patient, patient wife, and patient guardian are all in agreement and facility is willing and ready to accept Saturday 10/06/13 - PA aware.  CSW notified ALF that assessment in hospital for patient return was no longer needed on Monday.  Clinical Social Worker remains available for support and to facilitate patient discharge needs once medically ready.  Barbette Or, [place]
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
## 2013-10-06 MED ORDER — TRAMADOL HCL 50 MG PO TABS: 100.0000 mg | ORAL_TABLET | Freq: Four times a day (QID) | ORAL | Status: DC

## 2013-10-06 MED ORDER — LORAZEPAM 0.5 MG PO TABS: 0.5000 mg | ORAL_TABLET | Freq: Two times a day (BID) | ORAL | Status: DC

## 2013-10-06 MED ORDER — SULFAMETHOXAZOLE-TMP DS 800-160 MG PO TABS: 1.0000 | ORAL_TABLET | Freq: Two times a day (BID) | ORAL | Status: DC

## 2013-10-06 MED ORDER — METHADONE HCL 5 MG PO TABS: 15.0000 mg | ORAL_TABLET | Freq: Two times a day (BID) | ORAL | Status: DC

## 2013-10-06 MED ORDER — BACITRACIN ZINC 500 UNIT/GM EX OINT
TOPICAL_OINTMENT | Freq: Two times a day (BID) | CUTANEOUS | Status: DC
Start: 2013-10-06 — End: 2014-01-01

## 2013-10-06 NOTE — Progress Notes (Signed)
  Subjective: He complains of a headache.  Objective: Vital signs in last 24 hours: Temp:  [97.3 F (36.3 C)-97.8 F (36.6 C)] 97.4 F (36.3 C) (07/18 0600) Pulse Rate:  [65-71] 71 (07/18 0600) Resp:  [18] 18 (07/18 0600) BP: (94-171)/(49-73) 171/56 mmHg (07/18 0600) SpO2:  [95 %-99 %] 99 % (07/18 0600) Last BM Date:  (unknown)  Intake/Output from previous day:   Intake/Output this shift:    PE: General- In NAD HEENT-left sided ecchymosis, lacerations are clean CV-RRR Abdomen-soft, non tender Neuro-confused, pleasant  Lab Results:  No results found for this basename: WBC, HGB, HCT, PLT,  in the last 72 hours BMET No results found for this basename: NA, K, CL, CO2, GLUCOSE, BUN, CREATININE, CALCIUM,  in the last 72 hours PT/INR No results found for this basename: LABPROT, INR,  in the last 72 hours Comprehensive Metabolic Panel:    Component Value Date/Time   NA 139 10/02/2013 0257   NA 140 10/01/2013 0454   K 4.9 10/02/2013 0257   K 4.2 10/01/2013 0454   CL 103 10/02/2013 0257   CL 101 10/01/2013 0454   CO2 26 10/02/2013 0257   CO2 26 10/01/2013 0454   BUN 22 10/02/2013 0257   BUN 26* 10/01/2013 0454   CREATININE 1.02 10/02/2013 0257   CREATININE 1.16 10/01/2013 0454   GLUCOSE 112* 10/02/2013 0257   GLUCOSE 156* 10/01/2013 0454   CALCIUM 8.4 10/02/2013 0257   CALCIUM 8.7 10/01/2013 0454   AST 30 07/13/2012 2105   AST 20 06/06/2011 1120   ALT 8 07/13/2012 2105   ALT 12 06/06/2011 1120   ALKPHOS 69 07/13/2012 2105   ALKPHOS 67 06/06/2011 1120   BILITOT 0.4 07/13/2012 2105   BILITOT 0.4 06/06/2011 1120   PROT 7.0 07/13/2012 2105   PROT 6.5 06/06/2011 1120   ALBUMIN 4.1 07/13/2012 2105   ALBUMIN 3.9 06/06/2011 1120     Studies/Results: No results found.  Anti-infectives: Anti-infectives   Start     Dose/Rate Route Frequency Ordered Stop   10/03/13 1300  sulfamethoxazole-trimethoprim (BACTRIM DS) 800-160 MG per tablet 1 tablet     1 tablet Oral Every 12 hours 10/03/13 1236  10/08/13 0959   10/01/13 0500  ceFAZolin (ANCEF) IVPB 1 g/50 mL premix     1 g 100 mL/hr over 30 Minutes Intravenous  Once 10/01/13 0459 10/01/13 0630      Assessment Fall  TBI w/SDH, BSF -- Stable Facial fxs/lac -- wounds healing well Delirium -- Much improved UTI -- UA equivocal but will treat presumptively given delirium, Septra D4/5  Multiple medical problems -- Home meds  FEN -- No issues  VTE -- SCD's  Dispo -- PT/OT/ST   LOS: 5 days   Plan: SNF when bed available.   Odis Hollingshead 10/06/2013

## 2013-10-06 NOTE — Progress Notes (Signed)
Pt d/c to SNF by ambulance. Assessment stable. Report called to [facility].

## 2013-10-06 NOTE — Progress Notes (Signed)
Patient is medically stable for D/C to [facility] today. Per [place] RN at SNF patient is going to room 126-A. Clinical Education officer, museum (CSW) prepared D/C packet, faxed D/C summary to RN, and arranged EMS for transport. CSW contacted patient's wife and made her aware of above. CSW left message with patient's guardian Williemae Natter and made her aware of above. RN aware of above. Please reconsult if future social work needs arise. CSW signing off.   Blima Rich, [place] [facility] CSW [phone]
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
## 2013-10-06 NOTE — Discharge Summary (Signed)
Physician Discharge Summary  Patient ID: ANTWANE GROSE MRN: [id number] DOB/AGE: Feb 18, 1925 80 y.o.  Admit date: 10/01/2013 Discharge date: 10/06/2013  Admission Diagnoses:  Recurrent falls with traumatic subdural hematoma Basilar skull fracture Multiple facial fracture Facial lacerations Acute delirium with chronic dementia Hx of polypharmacy with altered mental status Anemia Hypothyroid Chronic pain neck and back Constipation GERD  Discharge Diagnoses:  Recurrent falls with traumatic subdural hematoma Basilar skull fracture Multiple facial fracture Facial lacerations Acute delirium with chronic dementia Hx of polypharmacy with altered mental status Anemia Hypothyroid Chronic pain neck and back Constipation GERD Possible UTI  Active Problems:   Traumatic subdural hematoma   Fall   Multiple facial fractures   Facial laceration   Basilar skull fracture   Acute blood loss anemia   Acute delirium   UTI (urinary tract infection)   PROCEDURES: None  Hospital Course:  Patient is a nursing home resident with a history of dementia, chronic pain, and polypharmacy. He was seen in the ED after a fall on 09/26/2013. He fell out of bed again tonight while sleeping. He does not remember falling. Uncertain if he had loss of consciousness. He was able to ambulate at the scene out to the nursing station to get help. He complains of pain from facial lacerations around his left eye. He denies visual changes. He was brought to the emergency department as a nontrauma code activation. Workup demonstrates facial lacerations, parafalcine and L middle cranial fossa subdural hematomas, L sphenoid wing fracture, as well as L maxillary sinus, zygomatic arch, and orbital fractures. I was asked to admit him to the trauma service. He was seen by Dr. Saintclair Halsted of the Neurology service.  Studies reveals:  basilar skull fracture and what appears to be a thickened falx possibly could be a small parafalcine  subdural which did not require surgery. He was also seen by Dr. Benson Norway for his facial fractures and this revealed multiple to minimal displaced fractures with minimal to moderately displaced midfacial fractures including an orbital floor with no entrapment of the EOMs. He did not require surgery for this either.  His delirium got worse in the hospital  He was not a candidate for In patient rehab.  He did not require any further in hospital treatment.  We did look at sources and there was a possibility of UTI so he was treated for this.  A SNF bed has been obtained and he is ready for discharge back to SNF. He still remains confused and disoriented.    CBC Latest Ref Rng 10/02/2013 10/01/2013 07/17/2012  WBC 4.0 - 10.5 K/uL 5.2 7.3 5.0  Hemoglobin 13.0 - 17.0 g/dL 10.8(L) 11.2(L) 12.1(L)  Hematocrit 39.0 - 52.0 % 33.2(L) 33.6(L) 46.8  Platelets 150 - 400 K/uL 147(L) 141(L) 156   CMP     Component Value Date/Time   NA 139 10/02/2013 0257   K 4.9 10/02/2013 0257   CL 103 10/02/2013 0257   CO2 26 10/02/2013 0257   GLUCOSE 112* 10/02/2013 0257   BUN 22 10/02/2013 0257   CREATININE 1.02 10/02/2013 0257   CALCIUM 8.4 10/02/2013 0257   PROT 7.0 07/13/2012 2105   ALBUMIN 4.1 07/13/2012 2105   AST 30 07/13/2012 2105   ALT 8 07/13/2012 2105   ALKPHOS 69 07/13/2012 2105   BILITOT 0.4 07/13/2012 2105   GFRNONAA 64* 10/02/2013 0257   GFRAA 74* 10/02/2013 0257     Condition on D/C:  Stable   Prior to Admission medications From prior SNF  Medication Sig Start Date End Date Taking? Authorizing Provider  acetaminophen (TYLENOL) 325 MG tablet Take 650 mg by mouth every 12 (twelve) hours as needed for mild pain.    Yes Historical Provider, MD  divalproex (DEPAKOTE SPRINKLE) 125 MG capsule Take 500 mg by mouth at bedtime.   Yes Historical Provider, MD  docusate sodium (COLACE) 100 MG capsule Take 100 mg by mouth 2 (two) times daily.   Yes Historical Provider, MD  dutasteride (AVODART) 0.5 MG capsule Take 0.5 mg by  mouth every morning.    Yes Historical Provider, MD  escitalopram (LEXAPRO) 10 MG tablet Take 10 mg by mouth at bedtime.    Yes Historical Provider, MD  eszopiclone (LUNESTA) 1 MG TABS tablet Take 1 mg by mouth at bedtime as needed for sleep. Take immediately before bedtime   Yes Historical Provider, MD  guaifenesin (ROBITUSSIN) 100 MG/5ML syrup Take 200 mg by mouth every 4 (four) hours as needed for cough.   Yes Historical Provider, MD  levothyroxine (SYNTHROID, LEVOTHROID) 88 MCG tablet Take 88 mcg by mouth at bedtime.   Yes Historical Provider, MD  Linaclotide (LINZESS) 290 MCG CAPS capsule Take 290 mcg by mouth every morning.    Yes Historical Provider, MD  LORazepam (ATIVAN) 0.5 MG tablet Take 0.5 mg by mouth 2 (two) times daily.   Yes Historical Provider, MD  LORazepam (ATIVAN) 0.5 MG tablet Take 0.5 mg by mouth every 8 (eight) hours as needed for anxiety.   Yes Historical Provider, MD  methadone (DOLOPHINE) 10 MG tablet Take 15 mg by mouth every 12 (twelve) hours.   Yes Historical Provider, MD  methocarbamol (ROBAXIN) 500 MG tablet Take 500 mg by mouth at bedtime.   Yes Historical Provider, MD  Multiple Vitamins-Minerals (MULTIVITAMINS THER. W/MINERALS) TABS Take 1 tablet by mouth daily.   Yes Historical Provider, MD  omeprazole (PRILOSEC) 40 MG capsule Take 40 mg by mouth daily.   Yes Historical Provider, MD  oxyCODONE-acetaminophen (PERCOCET/ROXICET) 5-325 MG per tablet Take one tablet by mouth every 4 hours as needed for severe pain 07/23/13  Yes Tiffany L Reed, DO  polyethylene glycol (MIRALAX / GLYCOLAX) packet Take 17 g by mouth daily as needed for mild constipation (hold for diarrhea).   Yes Historical Provider, MD  Probiotic Product (ALIGN) 4 MG CAPS Take 4 mg by mouth daily.   Yes Historical Provider, MD  Rivastigmine (EXELON) 13.3 MG/24HR PT24 Place 13.3 mg onto the skin daily.    Yes Historical Provider, MD  tamsulosin (FLOMAX) 0.4 MG CAPS Take 0.4 mg by mouth at bedtime.    Yes  Historical Provider, MD               Disposition: 03-Skilled Nursing Facility     Medication List         acetaminophen 325 MG tablet  Commonly known as:  TYLENOL  Take 650 mg by mouth every 12 (twelve) hours as needed for mild pain.     ALIGN 4 MG Caps  Take 4 mg by mouth daily.     bacitracin ointment  Apply topically 2 (two) times daily. Use twice a day for facial abrasions .     divalproex 125 MG capsule  Commonly known as:  DEPAKOTE SPRINKLE  Take 500 mg by mouth at bedtime.     docusate sodium 100 MG capsule  Commonly known as:  COLACE  Take 100 mg by mouth 2 (two) times daily.     dutasteride 0.5 MG capsule  Commonly known as:  AVODART  Take 0.5 mg by mouth every morning.     escitalopram 10 MG tablet  Commonly known as:  LEXAPRO  Take 10 mg by mouth at bedtime.     eszopiclone 1 MG Tabs tablet  Commonly known as:  LUNESTA  Take 1 mg by mouth at bedtime as needed for sleep. Take immediately before bedtime     EXELON 13.3 MG/24HR Pt24  Generic drug:  Rivastigmine  Place 13.3 mg onto the skin daily.     guaifenesin 100 MG/5ML syrup  Commonly known as:  ROBITUSSIN  Take 200 mg by mouth every 4 (four) hours as needed for cough.     levothyroxine 88 MCG tablet  Commonly known as:  SYNTHROID, LEVOTHROID  Take 88 mcg by mouth at bedtime.     LINZESS 290 MCG Caps capsule  Generic drug:  Linaclotide  Take 290 mcg by mouth every morning.     LORazepam 0.5 MG tablet  Commonly known as:  ATIVAN  Take 0.5 mg by mouth 2 (two) times daily.     LORazepam 0.5 MG tablet  Commonly known as:  ATIVAN  Take 0.5 mg by mouth every 8 (eight) hours as needed for anxiety.     methadone 10 MG tablet  Commonly known as:  DOLOPHINE  Take 15 mg by mouth every 12 (twelve) hours.     methocarbamol 500 MG tablet  Commonly known as:  ROBAXIN  Take 500 mg by mouth at bedtime.     multivitamins ther. w/minerals Tabs tablet  Take 1 tablet by mouth daily.      omeprazole 40 MG capsule  Commonly known as:  PRILOSEC  Take 40 mg by mouth daily.     oxyCODONE-acetaminophen 5-325 MG per tablet  Commonly known as:  PERCOCET/ROXICET  Take one tablet by mouth every 4 hours as needed for severe pain     polyethylene glycol packet  Commonly known as:  MIRALAX / GLYCOLAX  Take 17 g by mouth daily as needed for mild constipation (hold for diarrhea).     sulfamethoxazole-trimethoprim 800-160 MG per tablet  Commonly known as:  BACTRIM DS  Take 1 tablet by mouth every 12 (twelve) hours.     tamsulosin 0.4 MG Caps capsule  Commonly known as:  FLOMAX  Take 0.4 mg by mouth at bedtime.     traMADol 50 MG tablet  Commonly known as:  ULTRAM  Take 2 tablets (100 mg total) by mouth every 6 (six) hours.       Follow-up Information   Follow up with CRAM,GARY P, MD. (You can follow up as needed for his skull fracture and subdural hematoma.)    Specialty:  Neurosurgery   Contact information:   [address] [place] [address] [phone]       Follow up with Follow up for medical management with primary care and Methadone treatment paln.      Follow up with Ray Church, MD. (You can follow up as needed for facial fractures.  No treatment was needed at discharge, or in the hsopital..)    Specialty:  Oral Surgery   Contact information:   [address] [place] [address] [phone]      I have not followed this patient, but have done his discharge from the notes done during his hospital stay. SignedEarnstine Regal 10/06/2013, 12:16 PM

## 2013-10-07 NOTE — Discharge Summary (Signed)
Agree with discharge summary.

## 2013-10-09 ENCOUNTER — Non-Acute Institutional Stay (SKILLED_NURSING_FACILITY): Payer: Medicare Other | Admitting: Internal Medicine

## 2013-10-09 ENCOUNTER — Encounter: Payer: Self-pay | Admitting: Internal Medicine

## 2013-10-09 DIAGNOSIS — M549 Dorsalgia, unspecified: Secondary | ICD-10-CM

## 2013-10-09 DIAGNOSIS — F03918 Unspecified dementia, unspecified severity, with other behavioral disturbance: Secondary | ICD-10-CM

## 2013-10-09 DIAGNOSIS — S0180XA Unspecified open wound of other part of head, initial encounter: Secondary | ICD-10-CM

## 2013-10-09 DIAGNOSIS — S0292XD Unspecified fracture of facial bones, subsequent encounter for fracture with routine healing: Secondary | ICD-10-CM

## 2013-10-09 DIAGNOSIS — S0181XD Laceration without foreign body of other part of head, subsequent encounter: Secondary | ICD-10-CM

## 2013-10-09 DIAGNOSIS — IMO0001 Reserved for inherently not codable concepts without codable children: Secondary | ICD-10-CM

## 2013-10-09 DIAGNOSIS — S0210XD Unspecified fracture of base of skull, subsequent encounter for fracture with routine healing: Secondary | ICD-10-CM

## 2013-10-09 DIAGNOSIS — Z5189 Encounter for other specified aftercare: Secondary | ICD-10-CM

## 2013-10-09 DIAGNOSIS — F0391 Unspecified dementia with behavioral disturbance: Secondary | ICD-10-CM

## 2013-10-09 DIAGNOSIS — G92 Toxic encephalopathy: Secondary | ICD-10-CM

## 2013-10-09 DIAGNOSIS — S065X1D Traumatic subdural hemorrhage with loss of consciousness of 30 minutes or less, subsequent encounter: Secondary | ICD-10-CM

## 2013-10-09 DIAGNOSIS — G8929 Other chronic pain: Secondary | ICD-10-CM

## 2013-10-09 DIAGNOSIS — G929 Unspecified toxic encephalopathy: Secondary | ICD-10-CM

## 2013-10-09 NOTE — Assessment & Plan Note (Signed)
Sutured on 7/13 - should be removed soon

## 2013-10-09 NOTE — Assessment & Plan Note (Signed)
Sources were sought, it was thought maybe he had UTI so it was treated until 7/24 with septra.

## 2013-10-09 NOTE — Assessment & Plan Note (Addendum)
Pt is addicted, will stop Percocet sooner rather than later but way too soon now; will continue methadone

## 2013-10-09 NOTE — Assessment & Plan Note (Signed)
Result of fall and accompanied by small subdural;no surg required

## 2013-10-09 NOTE — Progress Notes (Signed)
MRN: [id number] Name: George Foster  Sex: male Age: 80 y.o. DOB: 30-Aug-1924  [place] #: [facility] Facility/Room: 126A Level Of Care: SNF Provider: Inocencio Homes D Emergency Contacts: Extended Emergency Contact Information Primary Emergency Contact: Andrews,Shirley Address: [address]          [place], [place] [address] [place] Phone: [phone] Work Phone: [phone] Relation: Legal Guardian Secondary Emergency Contact: Trinda Pascal Address: [address]          [place], [place] [address] [place] of [place] Phone: [phone] Mobile Phone: [phone] Relation: Spouse  Code Status:   Allergies: Review of patient's allergies indicates no known allergies.  Chief Complaint  Patient presents with  . nursing home admission    HPI: Patient is 80 y.o. male who fell at SNF, with resultant small subdural, basilar skull fx, multiple non displaced facial fx, non of which required surgery, all of which he tolerated well who is now readmitted back to SNF.  Past Medical History  Diagnosis Date  . Dementia   . Hypertension   . Arthritis   . Hypothyroidism   . Chronic back pain   . Anxiety disorder   . Gastroesophageal reflux disease   . Polypharmacy     History of hospitalizations for altered mental status related to medications  . Chronic constipation     with fecal impactions, recurrent    Past Surgical History  Procedure Laterality Date  . Appendectomy    . Removal of nonrestorable teeth numbers 2, 4, 6, 12, 13, 18, 29        Medication List       This list is accurate as of: 10/09/13 10:01 PM.  Always use your most recent med list.               acetaminophen 325 MG tablet  Commonly known as:  TYLENOL  Take 650 mg by mouth every 12 (twelve) hours as needed for mild pain.     ALIGN 4 MG Caps  Take 4 mg by mouth daily.     bacitracin ointment  Apply topically 2 (two) times daily. Use twice a day for facial abrasions .      divalproex 125 MG capsule  Commonly known as:  DEPAKOTE SPRINKLE  Take 500 mg by mouth at bedtime.     docusate sodium 100 MG capsule  Commonly known as:  COLACE  Take 100 mg by mouth 2 (two) times daily.     dutasteride 0.5 MG capsule  Commonly known as:  AVODART  Take 0.5 mg by mouth every morning.     escitalopram 10 MG tablet  Commonly known as:  LEXAPRO  Take 10 mg by mouth at bedtime.     eszopiclone 1 MG Tabs tablet  Commonly known as:  LUNESTA  Take 1 mg by mouth at bedtime as needed for sleep. Take immediately before bedtime     EXELON 13.3 MG/24HR Pt24  Generic drug:  Rivastigmine  Place 13.3 mg onto the skin daily.     guaifenesin 100 MG/5ML syrup  Commonly known as:  ROBITUSSIN  Take 200 mg by mouth every 4 (four) hours as needed for cough.     levothyroxine 88 MCG tablet  Commonly known as:  SYNTHROID, LEVOTHROID  Take 88 mcg by mouth at bedtime.     LINZESS 290 MCG Caps capsule  Generic drug:  Linaclotide  Take 290 mcg by mouth every morning.     LORazepam 0.5 MG tablet  Commonly known as:  ATIVAN  Take 0.5 mg by mouth 2 (two) times daily.     LORazepam 0.5 MG tablet  Commonly known as:  ATIVAN  Take 0.5 mg by mouth every 8 (eight) hours as needed for anxiety.     methadone 5 MG tablet  Commonly known as:  DOLOPHINE  Take 3 tablets (15 mg total) by mouth every 12 (twelve) hours.     methocarbamol 500 MG tablet  Commonly known as:  ROBAXIN  Take 500 mg by mouth at bedtime.     multivitamins ther. w/minerals Tabs tablet  Take 1 tablet by mouth daily.     omeprazole 40 MG capsule  Commonly known as:  PRILOSEC  Take 40 mg by mouth daily.     oxyCODONE-acetaminophen 5-325 MG per tablet  Commonly known as:  PERCOCET/ROXICET  Take one tablet by mouth every 4 hours as needed for severe pain     polyethylene glycol packet  Commonly known as:  MIRALAX / GLYCOLAX  Take 17 g by mouth daily as needed for mild constipation (hold for diarrhea).      sulfamethoxazole-trimethoprim 800-160 MG per tablet  Commonly known as:  BACTRIM DS  Take 1 tablet by mouth every 12 (twelve) hours.     tamsulosin 0.4 MG Caps capsule  Commonly known as:  FLOMAX  Take 0.4 mg by mouth at bedtime.     traMADol 50 MG tablet  Commonly known as:  ULTRAM  Take 2 tablets (100 mg total) by mouth every 6 (six) hours.        No orders of the defined types were placed in this encounter.    Immunization History  Administered Date(s) Administered  . Tdap 09/27/2013    History  Substance Use Topics  . Smoking status: Never Smoker   . Smokeless tobacco: Never Used  . Alcohol Use: No     Comment: Pt denies    Family history is noncontributory    ROS  From pt GENERAL: Feels well no fevers, fatigue, appetite changes SKIN: No itching, rash;  Wound,doing well EYES: No eye pain, redness, discharge EARS: No earache, tinnitus, change in hearing NOSE: No congestion, drainage or bleeding  MOUTH/THROAT: No mouth or tooth pain, No sore throat RESPIRATORY: No cough, wheezing, SOB CARDIAC: No chest pain, palpitations, lower extremity edema  GI: No abdominal pain, No N/V/D or constipation, No heartburn or reflux  GU: No dysuria, frequency or urgency, or incontinence  MUSCULOSKELETAL: No unrelieved bone/joint pain NEUROLOGIC: + headache, no dizziness or focal weakness PSYCHIATRIC: No overt anxiety or sadness. Sleeps well. No behavior issue.   Filed Vitals:   10/09/13 2134  BP: 125/83  Pulse: 71  Temp: 98.7 F (37.1 C)  Resp: 18    Physical Exam  GENERAL APPEARANCE: Alert, conversant. Appropriately groomed. No acute distress.  SKIN: No diaphoresis rash; facial bruising resolving;beautiful suture work around L eye HEAD: Normocephalic, atraumatic  EYES: Conjunctiva/lids clear. Pupils round, reactive. EOMs intact.  EARS: External exam WNL, canals clear. Hearing grossly normal.  NOSE: No deformity or discharge.  MOUTH/THROAT: Lips w/o  lesions RESPIRATORY: Breathing is even, unlabored. Lung sounds are clear   CARDIOVASCULAR: Heart RRR no murmurs, rubs or gallops. No peripheral edema.   GASTROINTESTINAL: Abdomen is soft, non-tender, not distended w/ normal bowel sounds GENITOURINARY: Bladder non tender, not distended  MUSCULOSKELETAL: No abnormal joints or musculature NEUROLOGIC: Oriented X3. Cranial nerves 2-12 grossly intact. Moves all extremities no tremor. PSYCHIATRIC: Mood and affect appropriate to situation,  does not appear confused at this time;no behavioral issues  Patient Active Problem List   Diagnosis Date Noted  . UTI (urinary tract infection) 10/04/2013  . Acute delirium 10/03/2013  . Fall 10/02/2013  . Multiple facial fractures 10/02/2013  . Facial laceration 10/02/2013  . Basilar skull fracture 10/02/2013  . Acute blood loss anemia 10/02/2013  . Traumatic subdural hematoma 10/01/2013  . BPH (benign prostatic hyperplasia) 02/17/2013  . Osteoarthritis cervical spine 02/17/2013  . Chronic constipation   . Chronic back pain 06/08/2011  . Toxic encephalopathy 06/06/2011  . E. coli urinary tract infection 06/06/2011  . Dementia with behavioral disturbance 06/06/2011  . Wounds, multiple 06/06/2011  . Suicidal ideation 06/06/2011  . Hypertension   . Hypothyroidism   . Gastroesophageal reflux disease   . Anxiety disorder   . Polypharmacy     CBC    Component Value Date/Time   WBC 5.2 10/02/2013 0257   RBC 3.24* 10/02/2013 0257   HGB 10.8* 10/02/2013 0257   HCT 33.2* 10/02/2013 0257   PLT 147* 10/02/2013 0257   MCV 102.5* 10/02/2013 0257   LYMPHSABS 0.6* 10/01/2013 0454   MONOABS 0.6 10/01/2013 0454   EOSABS 0.1 10/01/2013 0454   BASOSABS 0.0 10/01/2013 0454    CMP     Component Value Date/Time   NA 139 10/02/2013 0257   K 4.9 10/02/2013 0257   CL 103 10/02/2013 0257   CO2 26 10/02/2013 0257   GLUCOSE 112* 10/02/2013 0257   BUN 22 10/02/2013 0257   CREATININE 1.02 10/02/2013 0257   CALCIUM 8.4  10/02/2013 0257   PROT 7.0 07/13/2012 2105   ALBUMIN 4.1 07/13/2012 2105   AST 30 07/13/2012 2105   ALT 8 07/13/2012 2105   ALKPHOS 69 07/13/2012 2105   BILITOT 0.4 07/13/2012 2105   GFRNONAA 64* 10/02/2013 0257   GFRAA 74* 10/02/2013 0257    Assessment and Plan  Traumatic subdural hematoma 2/2 fall from bed at SNF. Pt has no memory of fall. Small subdural, no surg required  Basilar skull fracture Result of fall and accompanied by small subdural;no surg required  Multiple facial fractures Same as above  Facial laceration Sutured on 7/13 - should be removed soon  Toxic encephalopathy Sources were sought, it was thought maybe he had UTI so it was treated until 7/24 with septra.  Dementia with behavioral disturbance Continue exelon and depakote and ativan  Chronic back pain Pt is addicted, will stop Percocet sooner rather than later but way too soon now    [name], MD

## 2013-10-09 NOTE — Assessment & Plan Note (Signed)
2/2 fall from bed at [facility]. Pt has no memory of fall. Small subdural, no surg required

## 2013-10-09 NOTE — Assessment & Plan Note (Signed)
Same as above

## 2013-10-09 NOTE — Assessment & Plan Note (Signed)
Continue exelon and depakote and ativan

## 2013-10-15 ENCOUNTER — Other Ambulatory Visit: Payer: Self-pay | Admitting: *Deleted

## 2013-10-15 MED ORDER — METHADONE HCL 10 MG PO TABS: ORAL_TABLET | ORAL | Status: DC

## 2013-10-15 NOTE — Telephone Encounter (Signed)
[facility] 

## 2013-10-22 ENCOUNTER — Encounter: Payer: Self-pay | Admitting: Internal Medicine

## 2013-11-14 ENCOUNTER — Other Ambulatory Visit: Payer: Self-pay | Admitting: *Deleted

## 2013-11-14 MED ORDER — LORAZEPAM 0.5 MG PO TABS: ORAL_TABLET | ORAL | Status: DC

## 2013-11-14 NOTE — Telephone Encounter (Signed)
[facility] 

## 2013-11-19 ENCOUNTER — Other Ambulatory Visit: Payer: Self-pay | Admitting: *Deleted

## 2013-11-19 MED ORDER — METHADONE HCL 10 MG PO TABS: ORAL_TABLET | ORAL | Status: DC

## 2013-11-19 NOTE — Telephone Encounter (Signed)
[facility] 

## 2013-11-22 ENCOUNTER — Other Ambulatory Visit: Payer: Self-pay | Admitting: *Deleted

## 2013-11-22 MED ORDER — LORAZEPAM 0.5 MG PO TABS: ORAL_TABLET | ORAL | Status: DC

## 2013-11-22 NOTE — Telephone Encounter (Signed)
[facility] 

## 2013-11-27 ENCOUNTER — Other Ambulatory Visit: Payer: Self-pay | Admitting: *Deleted

## 2013-11-27 MED ORDER — METHADONE HCL 10 MG PO TABS: ORAL_TABLET | ORAL | Status: DC

## 2013-11-27 NOTE — Telephone Encounter (Signed)
[facility] 

## 2013-12-04 ENCOUNTER — Other Ambulatory Visit: Payer: Self-pay | Admitting: *Deleted

## 2013-12-04 MED ORDER — METHADONE HCL 10 MG PO TABS: ORAL_TABLET | ORAL | Status: DC

## 2013-12-04 NOTE — Telephone Encounter (Signed)
[facility] 

## 2013-12-12 ENCOUNTER — Other Ambulatory Visit: Payer: Self-pay | Admitting: *Deleted

## 2013-12-12 MED ORDER — METHADONE HCL 10 MG PO TABS: ORAL_TABLET | ORAL | Status: DC

## 2013-12-12 NOTE — Telephone Encounter (Signed)
[facility] 

## 2013-12-18 ENCOUNTER — Other Ambulatory Visit: Payer: Self-pay | Admitting: *Deleted

## 2013-12-18 MED ORDER — METHADONE HCL 10 MG PO TABS: ORAL_TABLET | ORAL | Status: DC

## 2013-12-18 NOTE — Telephone Encounter (Signed)
[facility] 

## 2014-01-01 ENCOUNTER — Other Ambulatory Visit: Payer: Self-pay | Admitting: *Deleted

## 2014-01-01 ENCOUNTER — Non-Acute Institutional Stay (SKILLED_NURSING_FACILITY): Payer: Medicare Other | Admitting: Nurse Practitioner

## 2014-01-01 DIAGNOSIS — F03918 Unspecified dementia, unspecified severity, with other behavioral disturbance: Secondary | ICD-10-CM

## 2014-01-01 DIAGNOSIS — I10 Essential (primary) hypertension: Secondary | ICD-10-CM

## 2014-01-01 DIAGNOSIS — F411 Generalized anxiety disorder: Secondary | ICD-10-CM

## 2014-01-01 DIAGNOSIS — K5900 Constipation, unspecified: Secondary | ICD-10-CM

## 2014-01-01 DIAGNOSIS — K219 Gastro-esophageal reflux disease without esophagitis: Secondary | ICD-10-CM

## 2014-01-01 DIAGNOSIS — K5909 Other constipation: Secondary | ICD-10-CM

## 2014-01-01 DIAGNOSIS — F0391 Unspecified dementia with behavioral disturbance: Secondary | ICD-10-CM

## 2014-01-01 DIAGNOSIS — M47812 Spondylosis without myelopathy or radiculopathy, cervical region: Secondary | ICD-10-CM

## 2014-01-01 MED ORDER — METHADONE HCL 5 MG PO TABS: ORAL_TABLET | ORAL | Status: DC

## 2014-01-01 NOTE — Telephone Encounter (Signed)
[facility] 

## 2014-01-01 NOTE — Progress Notes (Signed)
Patient ID: George Foster, male   DOB: 1925-02-03, 80 y.o.   MRN: [id number]    Nursing Home Location:  [place] of Service: SNF (31)  PCP: No PCP Per Patient  No Known Allergies  Chief Complaint  Patient presents with  . Medical Management of Chronic Issues    HPI:  Patient is 80 y.o. male who fell at assisted living, with resultant small subdural, basilar skull fx, multiple non displaced facial fx, none of which required surgery, and was transferred back to SNF for long term care. Pt with a long standing hx of cervical spine pain with known DDD. Pts biggest complaint is that his pain is not well controlled. Pt was imaged after last fall without any acute finding to cervical spine. Pt reports pain is a 10/10 and movement of any kind makes it worse. Report robaxin and ultram do not touch the pain so he does not even take it because it does not make a difference   Review of Systems:  Review of Systems  Constitutional: Negative for activity change, appetite change, fatigue and unexpected weight change.  HENT: Negative for congestion and hearing loss.   Eyes: Negative.   Respiratory: Negative for cough and shortness of breath.   Cardiovascular: Negative for chest pain, palpitations and leg swelling.  Gastrointestinal: Negative for abdominal pain, diarrhea and constipation.  Genitourinary: Negative for dysuria and difficulty urinating.  Musculoskeletal: Positive for back pain, myalgias and neck pain. Negative for arthralgias.  Skin: Negative for color change and wound.  Neurological: Positive for tremors. Negative for dizziness and weakness.  Psychiatric/Behavioral: Negative for behavioral problems, confusion and agitation.    Past Medical History  Diagnosis Date  . Dementia   . Hypertension   . Arthritis   . Hypothyroidism   . Chronic back pain   . Anxiety disorder   . Gastroesophageal reflux disease   . Polypharmacy     History of  hospitalizations for altered mental status related to medications  . Chronic constipation     with fecal impactions, recurrent   Past Surgical History  Procedure Laterality Date  . Appendectomy    . Removal of nonrestorable teeth numbers 2, 4, 6, 12, 13, 18, 29     Social History:   reports that he has never smoked. He has never used smokeless tobacco. He reports that he does not drink alcohol or use illicit drugs.  Family History  Problem Relation Age of Onset  . Heart disease Father     Medications: Patient's Medications  New Prescriptions   No medications on file  Previous Medications   ACETAMINOPHEN (TYLENOL) 325 MG TABLET    Take 650 mg by mouth every 12 (twelve) hours as needed for mild pain.    DIVALPROEX (DEPAKOTE SPRINKLE) 125 MG CAPSULE    Take 500 mg by mouth at bedtime.   DOCUSATE SODIUM (COLACE) 100 MG CAPSULE    Take 100 mg by mouth 2 (two) times daily.   DUTASTERIDE (AVODART) 0.5 MG CAPSULE    Take 0.5 mg by mouth every morning.    ESCITALOPRAM (LEXAPRO) 10 MG TABLET    Take 10 mg by mouth at bedtime.    ESZOPICLONE (LUNESTA) 1 MG TABS TABLET    Take 1 mg by mouth at bedtime as needed for sleep. Take immediately before bedtime   GUAIFENESIN (ROBITUSSIN) 100 MG/5ML SYRUP    Take 200 mg by mouth every 4 (four) hours as needed for cough.  LEVOTHYROXINE (SYNTHROID, LEVOTHROID) 88 MCG TABLET    Take 88 mcg by mouth at bedtime.   LINACLOTIDE (LINZESS) 290 MCG CAPS CAPSULE    Take 290 mcg by mouth every morning.    LORAZEPAM (ATIVAN) 0.5 MG TABLET    Take one tablet by mouth every 8 hours as needed for anixety   METHADONE (DOLOPHINE) 10 MG TABLET    Take one and one-half tablets by mouth every 12 hours for pain   METHOCARBAMOL (ROBAXIN) 500 MG TABLET    Take 500 mg by mouth at bedtime.   MULTIPLE VITAMINS-MINERALS (MULTIVITAMINS THER. W/MINERALS) TABS    Take 1 tablet by mouth daily.   OMEPRAZOLE (PRILOSEC) 40 MG CAPSULE    Take 40 mg by mouth daily.    OXYCODONE-ACETAMINOPHEN (PERCOCET/ROXICET) 5-325 MG PER TABLET    Take one tablet by mouth every 4 hours as needed for severe pain   POLYETHYLENE GLYCOL (MIRALAX / GLYCOLAX) PACKET    Take 17 g by mouth daily as needed for mild constipation (hold for diarrhea).   PROBIOTIC PRODUCT (ALIGN) 4 MG CAPS    Take 4 mg by mouth daily.   TAMSULOSIN (FLOMAX) 0.4 MG CAPS    Take 0.4 mg by mouth at bedtime.    TRAMADOL (ULTRAM) 50 MG TABLET    Take 2 tablets (100 mg total) by mouth every 6 (six) hours.  Modified Medications   No medications on file  Discontinued Medications   BACITRACIN OINTMENT    Apply topically 2 (two) times daily. Use twice a day for facial abrasions .   LORAZEPAM (ATIVAN) 0.5 MG TABLET    Take 0.5 mg by mouth 2 (two) times daily.   METHADONE (DOLOPHINE) 5 MG TABLET    Take one and one half tablets by mouth every 12 hours for pain   RIVASTIGMINE (EXELON) 13.3 MG/24HR PT24    Place 13.3 mg onto the skin daily.    SULFAMETHOXAZOLE-TRIMETHOPRIM (BACTRIM DS) 800-160 MG PER TABLET    Take 1 tablet by mouth every 12 (twelve) hours.     Physical Exam: Filed Vitals:   01/01/14 1138  BP: 145/81  Pulse: 74  Temp: 98.7 F (37.1 C)  Resp: 20    Physical Exam  Constitutional: He appears well-developed and well-nourished. No distress.  HENT:  Mouth/Throat: Oropharynx is clear and moist. No oropharyngeal exudate.  Neck: Normal range of motion. Neck supple.  Cardiovascular: Normal rate, regular rhythm and normal heart sounds.   Pulmonary/Chest: Effort normal and breath sounds normal.  Abdominal: Soft. Bowel sounds are normal. He exhibits no distension and no mass. There is no tenderness.  Musculoskeletal: Normal range of motion. He exhibits tenderness.  Cervical spine  Neurological: He is alert.  Oriented to persona nd place  Skin: Skin is warm and dry.  Psychiatric: He has a normal mood and affect.    Labs reviewed: Basic Metabolic Panel:  Recent Labs  10/01/13 0454  10/02/13 0257  NA 140 139  K 4.2 4.9  CL 101 103  CO2 26 26  GLUCOSE 156* 112*  BUN 26* 22  CREATININE 1.16 1.02  CALCIUM 8.7 8.4   Liver Function Tests: No results found for this basename: AST, ALT, ALKPHOS, BILITOT, PROT, ALBUMIN,  in the last 8760 hours No results found for this basename: LIPASE, AMYLASE,  in the last 8760 hours No results found for this basename: AMMONIA,  in the last 8760 hours CBC:  Recent Labs  10/01/13 0454 10/02/13 0257  WBC 7.3 5.2  NEUTROABS 6.2  --   HGB 11.2* 10.8*  HCT 33.6* 33.2*  MCV 102.1* 102.5*  PLT 141* 147*   TSH: No results found for this basename: TSH,  in the last 8760 hours A1C: No results found for this basename: HGBA1C   Lipid Panel: No results found for this basename: CHOL, HDL, LDLCALC, TRIG, CHOLHDL, LDLDIRECT,  in the last 8760 hours    Assessment/Plan 1. Essential hypertension -Patient is stable; continue current regimen. Will monitor and make changes as necessary. -will follow up cmp 2. Chronic constipation Managed on linzess and PRN miralax  3. Gastroesophageal reflux disease without esophagitis -stable on omeprazole  4. Dementia with behavioral disturbance -stable, conts to follow with psych, currently on Depakote for dementia related behaviors   5. Osteoarthritis cervical spine -pt currently taking methadone, percocet, ultram and robaxin -reports increase in robaxin has not hleped therefore will dc this -also reports he does not take ultram due to it being not effective. Will stop this as well -will change percocet to 5/325 PO 2 tablet q 6 hours for better pain management, may need referral to pain clinic or ortho if this change is not effective   6. Generalized anxiety disorder -conts to be followed by psych, currently on lexapro with PRN ativan  7. Hypothyroid -follow up TSH

## 2014-01-15 ENCOUNTER — Other Ambulatory Visit: Payer: Self-pay | Admitting: *Deleted

## 2014-01-15 MED ORDER — METHADONE HCL 5 MG PO TABS: ORAL_TABLET | ORAL | Status: DC

## 2014-01-15 NOTE — Telephone Encounter (Signed)
[facility] 

## 2014-01-30 ENCOUNTER — Emergency Department (HOSPITAL_COMMUNITY): Payer: Medicare Other

## 2014-01-30 ENCOUNTER — Emergency Department (HOSPITAL_COMMUNITY)
Admission: EM | Admit: 2014-01-30 | Discharge: 2014-01-30 | Disposition: A | Discharge status: Home / Self Care | Payer: Medicare Other | Attending: Emergency Medicine | Admitting: Emergency Medicine

## 2014-01-30 ENCOUNTER — Encounter (HOSPITAL_COMMUNITY): Payer: Self-pay | Admitting: Emergency Medicine

## 2014-01-30 DIAGNOSIS — M1990 Unspecified osteoarthritis, unspecified site: Secondary | ICD-10-CM | POA: Diagnosis not present

## 2014-01-30 DIAGNOSIS — I10 Essential (primary) hypertension: Secondary | ICD-10-CM | POA: Diagnosis not present

## 2014-01-30 DIAGNOSIS — Z79899 Other long term (current) drug therapy: Secondary | ICD-10-CM | POA: Insufficient documentation

## 2014-01-30 DIAGNOSIS — M542 Cervicalgia: Secondary | ICD-10-CM | POA: Diagnosis not present

## 2014-01-30 DIAGNOSIS — G8929 Other chronic pain: Secondary | ICD-10-CM | POA: Insufficient documentation

## 2014-01-30 DIAGNOSIS — R519 Headache, unspecified: Secondary | ICD-10-CM

## 2014-01-30 DIAGNOSIS — K219 Gastro-esophageal reflux disease without esophagitis: Secondary | ICD-10-CM | POA: Diagnosis not present

## 2014-01-30 DIAGNOSIS — M549 Dorsalgia, unspecified: Secondary | ICD-10-CM | POA: Diagnosis present

## 2014-01-30 DIAGNOSIS — E039 Hypothyroidism, unspecified: Secondary | ICD-10-CM | POA: Insufficient documentation

## 2014-01-30 DIAGNOSIS — F419 Anxiety disorder, unspecified: Secondary | ICD-10-CM | POA: Insufficient documentation

## 2014-01-30 DIAGNOSIS — R51 Headache: Secondary | ICD-10-CM | POA: Insufficient documentation

## 2014-01-30 DIAGNOSIS — F0390 Unspecified dementia without behavioral disturbance: Secondary | ICD-10-CM | POA: Insufficient documentation

## 2014-01-30 MED ORDER — MORPHINE SULFATE 4 MG/ML IJ SOLN
8.0000 mg | Freq: Once | INTRAMUSCULAR | Status: AC
  Administered 2014-01-30: 8 mg via INTRAMUSCULAR
  Filled 2014-01-30: qty 2

## 2014-01-30 MED ORDER — ONDANSETRON 4 MG PO TBDP
4.0000 mg | ORAL_TABLET | Freq: Once | ORAL | Status: AC
  Administered 2014-01-30: 4 mg via ORAL
  Filled 2014-01-30: qty 1

## 2014-01-30 NOTE — Progress Notes (Signed)
  CARE MANAGEMENT ED NOTE 01/30/2014  Patient:  ZAKAI, GONYEA   Account Number:  [id number]  Date Initiated:  01/30/2014  Documentation initiated by:  Livia Snellen  Subjective/Objective Assessment:   patient presents to Ed with back piain     Subjective/Objective Assessment Detail:   Patient is resident at [facility]     Action/Plan:   Action/Plan Detail:   Anticipated DC Date:  01/30/2014     Status Recommendation to Physician:   Result of Recommendation:    Other ED [place]  Other  PCP issues    Choice offered to / List presented to:            Status of service:  Completed, signed off  ED Comments:   ED Comments Detail:  Per patient's paper chart, patient's pcp at nursing facility is Dr. Hollace Kinnier.  System updated.

## 2014-01-30 NOTE — ED Notes (Signed)
Bed: WA03 Expected date:  Expected time:  Means of arrival:  Comments: EMS- 80yo M, chronic back pain

## 2014-01-30 NOTE — Discharge Instructions (Signed)
Your physicians have made you an appointment with [facility] regarding your chronic neck pain.  Chronic Pain Chronic pain can be defined as pain that is off and on and lasts for 3-6 months or longer. Many things cause chronic pain, which can make it difficult to make a diagnosis. There are many treatment options available for chronic pain. However, finding a treatment that works well for you may require trying various approaches until the right one is found. Many people benefit from a combination of two or more types of treatment to control their pain. SYMPTOMS  Chronic pain can occur anywhere in the body and can range from mild to very severe. Some types of chronic pain include:  Headache.  Low back pain.  Cancer pain.  Arthritis pain.  Neurogenic pain. This is pain resulting from damage to nerves. People with chronic pain may also have other symptoms such as:  Depression.  Anger.  Insomnia.  Anxiety. DIAGNOSIS  Your health care provider will help diagnose your condition over time. In many cases, the initial focus will be on excluding possible conditions that could be causing the pain. Depending on your symptoms, your health care provider may order tests to diagnose your condition. Some of these tests may include:   Blood tests.   CT scan.   MRI.   X-rays.   Ultrasounds.   Nerve conduction studies.  You may need to see a specialist.  TREATMENT  Finding treatment that works well may take time. You may be referred to a pain specialist. He or she may prescribe medicine or therapies, such as:   Mindful meditation or yoga.  Shots (injections) of numbing or pain-relieving medicines into the spine or area of pain.  Local electrical stimulation.  Acupuncture.   Massage therapy.   Aroma, color, light, or sound therapy.   Biofeedback.   Working with a physical therapist to keep from getting stiff.   Regular, gentle exercise.    Cognitive or behavioral therapy.   Group support.  Sometimes, surgery may be recommended.  HOME CARE INSTRUCTIONS   Take all medicines as directed by your health care provider.   Lessen stress in your life by relaxing and doing things such as listening to calming music.   Exercise or be active as directed by your health care provider.   Eat a healthy diet and include things such as vegetables, fruits, fish, and lean meats in your diet.   Keep all follow-up appointments with your health care provider.   Attend a support group with others suffering from chronic pain. SEEK MEDICAL CARE IF:   Your pain gets worse.   You develop a new pain that was not there before.   You cannot tolerate medicines given to you by your health care provider.   You have new symptoms since your last visit with your health care provider.  SEEK IMMEDIATE MEDICAL CARE IF:   You feel weak.   You have decreased sensation or numbness.   You lose control of bowel or bladder function.   Your pain suddenly gets much worse.   You develop shaking.  You develop chills.  You develop confusion.  You develop chest pain.  You develop shortness of breath.  MAKE SURE YOU:  Understand these instructions.  Will watch your condition.  Will get help right away if you are not doing well or get worse. Document Released: 11/28/2001 Document Revised: 11/08/2012 Document Reviewed: 09/01/2012 [facility] Patient Information 2015 [place], [place]. This information is not  intended to replace advice given to you by your health care provider. Make sure you discuss any questions you have with your health care provider. ° °

## 2014-01-30 NOTE — ED Notes (Addendum)
Per ems- pt picked up from [facility] facility c/o chronic back pain, 9/10 pain.  Pt has hx of degenerative disc disease. Arrived ED alert and oriented per baseline.  Given 1 percocet at 1200.
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
## 2014-02-04 ENCOUNTER — Other Ambulatory Visit: Payer: Self-pay

## 2014-02-04 MED ORDER — OXYCODONE-ACETAMINOPHEN 5-325 MG PO TABS: ORAL_TABLET | ORAL | Status: DC

## 2014-02-04 NOTE — Telephone Encounter (Signed)
RX faxed to [facility] @ [phone], phone number [phone] 

## 2014-02-06 ENCOUNTER — Other Ambulatory Visit: Payer: Self-pay | Admitting: *Deleted

## 2014-02-06 MED ORDER — OXYCODONE-ACETAMINOPHEN 5-325 MG PO TABS: ORAL_TABLET | ORAL | Status: DC

## 2014-02-18 ENCOUNTER — Other Ambulatory Visit: Payer: Self-pay | Admitting: *Deleted

## 2014-02-18 MED ORDER — OXYCODONE-ACETAMINOPHEN 5-325 MG PO TABS: ORAL_TABLET | ORAL | Status: DC

## 2014-02-18 NOTE — Telephone Encounter (Signed)
[facility] 

## 2014-02-21 ENCOUNTER — Non-Acute Institutional Stay (SKILLED_NURSING_FACILITY): Payer: Medicare Other | Admitting: Adult Health

## 2014-02-21 DIAGNOSIS — G8929 Other chronic pain: Secondary | ICD-10-CM

## 2014-02-21 DIAGNOSIS — K5909 Other constipation: Secondary | ICD-10-CM

## 2014-02-21 DIAGNOSIS — F0391 Unspecified dementia with behavioral disturbance: Secondary | ICD-10-CM

## 2014-02-21 DIAGNOSIS — K219 Gastro-esophageal reflux disease without esophagitis: Secondary | ICD-10-CM

## 2014-02-21 DIAGNOSIS — M549 Dorsalgia, unspecified: Secondary | ICD-10-CM

## 2014-02-21 DIAGNOSIS — K5900 Constipation, unspecified: Secondary | ICD-10-CM

## 2014-02-21 DIAGNOSIS — N400 Enlarged prostate without lower urinary tract symptoms: Secondary | ICD-10-CM

## 2014-02-21 DIAGNOSIS — F411 Generalized anxiety disorder: Secondary | ICD-10-CM

## 2014-02-21 DIAGNOSIS — E039 Hypothyroidism, unspecified: Secondary | ICD-10-CM

## 2014-02-21 DIAGNOSIS — F03918 Unspecified dementia, unspecified severity, with other behavioral disturbance: Secondary | ICD-10-CM

## 2014-02-24 ENCOUNTER — Encounter: Payer: Self-pay | Admitting: Adult Health

## 2014-02-24 MED ORDER — POLYETHYLENE GLYCOL 3350 17 G PO PACK: 17.0000 g | PACK | Freq: Every day | ORAL | Status: DC

## 2014-02-24 NOTE — Progress Notes (Signed)
Patient ID: George Foster, male   DOB: [birth year], 80 y.o.   MRN: [id number]  [facility]     No Known Allergies     Chief Complaint  Patient presents with  . Medical Management of Chronic Issues    HPI:  He is a long term resident of this facility being seen for the management of his chronic illnesses. Overall his status is stable. He is not complaining of pain present. At this time he does not want to go to the pain clinic and states that if he changes his mind he will inform the staff of his desire. There are no nursing concerns at this time.    Past Medical History  Diagnosis Date  . Dementia   . Hypertension   . Arthritis   . Hypothyroidism   . Chronic back pain   . Anxiety disorder   . Gastroesophageal reflux disease   . Polypharmacy     History of hospitalizations for altered mental status related to medications  . Chronic constipation     with fecal impactions, recurrent    Past Surgical History  Procedure Laterality Date  . Appendectomy    . Removal of nonrestorable teeth numbers 2, 4, 6, 12, 13, 18, 29      VITAL SIGNS BP 145/81 mmHg  Pulse 74  Ht 5\' 10"  (1.778 m)  Wt 181 lb (82.101 kg)  BMI 25.97 kg/m2  SpO2 96%   Outpatient Encounter Prescriptions as of 02/21/2014  Medication Sig  . acetaminophen (TYLENOL) 325 MG tablet Take 650 mg by mouth every 12 (twelve) hours as needed for mild pain.   . divalproex (DEPAKOTE SPRINKLE) 125 MG capsule Take 250 mg by mouth at bedtime.   . docusate sodium (COLACE) 100 MG capsule Take 100 mg by mouth 2 (two) times daily.  Marland Kitchen dutasteride (AVODART) 0.5 MG capsule Take 0.5 mg by mouth every morning.   . escitalopram (LEXAPRO) 10 MG tablet Take 10 mg by mouth at bedtime.   . eszopiclone (LUNESTA) 1 MG TABS tablet Take 1 mg by mouth at bedtime as needed for sleep. Take immediately before bedtime  . guaifenesin (ROBITUSSIN) 100 MG/5ML syrup Take 200 mg by mouth every 4 (four) hours as needed for cough.  . LamoTRIgine 50 MG  TBDP Take 2 tablets by mouth daily.   Marland Kitchen levothyroxine (SYNTHROID, LEVOTHROID) 88 MCG tablet Take 88 mcg by mouth at bedtime.  . Linaclotide (LINZESS) 290 MCG CAPS capsule Take 290 mcg by mouth every morning.   Marland Kitchen LORazepam (ATIVAN) 0.5 MG tablet Take one tablet by mouth every 8 hours as needed for anixety  . methadone (DOLOPHINE) 10 MG tablet 15 mg. Take one and one-half tablets to equal 15 mg by mouth every 12 hours for pain  . methocarbamol (ROBAXIN) 500 MG tablet Take 500 mg by mouth at bedtime.  . methocarbamol (ROBAXIN) 500 MG tablet Take 500 mg by mouth 2 (two) times daily.  . Multiple Vitamins-Minerals (MULTIVITAMINS THER. W/MINERALS) TABS Take 1 tablet by mouth daily.  Marland Kitchen omeprazole (PRILOSEC) 40 MG capsule Take 40 mg by mouth daily.  Marland Kitchen oxyCODONE (OXY IR/ROXICODONE) 5 MG immediate release tablet Take 5-10 mg by mouth every 4 (four) hours as needed for severe pain. 10 mg every 8 hours as needed 5 mg every 4 hours as needed  . polyethylene glycol (MIRALAX / GLYCOLAX) packet Take 17 g by mouth daily as needed for mild constipation (hold for diarrhea).  . Rivastigmine 13.3 MG/24HR PT24 Place 1 patch  onto the skin every morning.  . tamsulosin (FLOMAX) 0.4 MG CAPS Take 0.4 mg by mouth at bedtime.   . traMADol (ULTRAM) 50 MG tablet Take 2 tablets (100 mg total) by mouth every 6 (six) hours.  . [DISCONTINUED] methadone (DOLOPHINE) 5 MG tablet Take one and one-half tablets by mouth every 12 hours (Patient not taking: Reported on 02/24/2014)  . [DISCONTINUED] oxyCODONE-acetaminophen (PERCOCET/ROXICET) 5-325 MG per tablet Take 2 tablets by mouth every 8 hours for pain. DO NOT EXCEED 3 GM APAP FROM ALL SOURCES (Patient not taking: Reported on 02/24/2014)  . [DISCONTINUED] Probiotic Product (ALIGN) 4 MG CAPS Take 4 mg by mouth daily.     SIGNIFICANT DIAGNOSTIC EXAMS  LABS REVIEWED:   01-03-14; wbc 5.0; hgb 13.4; hct 43.0; mcv 110.2; plt 186; glucose 94; bun 19.5; creat 1.04; k+4.4 ;na++140; liver  normal albumin 4.2; tsh 2.7     Review of Systems  Constitutional: Negative for malaise/fatigue.  Respiratory: Negative for cough.   Cardiovascular: Negative for chest pain, palpitations and leg swelling.  Gastrointestinal: Positive for constipation. Negative for abdominal pain.  Musculoskeletal: Negative for myalgias, back pain, joint pain and neck pain.  Skin: Negative.   Psychiatric/Behavioral: Negative for depression. The patient is not nervous/anxious.      Physical Exam  Constitutional: He is oriented to person, place, and time. He appears well-developed and well-nourished. No distress.  Neck: Neck supple. No JVD present. No thyromegaly present.  Cardiovascular: Normal rate, regular rhythm and intact distal pulses.   Respiratory: Effort normal and breath sounds normal. No respiratory distress. He has no wheezes.  GI: Soft. Bowel sounds are normal. He exhibits no distension. There is no tenderness.  Musculoskeletal: He exhibits no edema.  Is able to move all extremities Has limited range of motion in neck   Neurological: He is alert and oriented to person, place, and time.  Skin: Skin is warm and dry. He is not diaphoretic.       ASSESSMENT/ PLAN:  1. BPH: will continue proscar daily and fomax daily   2. Dementia: he is presently without change in his status; will continue exelon patch 13.3 mg daily will monitor  3. Hypothyroidism: will continue synthroid 88 mcg daily his tsh is 2.7  4. Gerd: will continue prilosec 40 mg daily   5. Constipation: will continue linzes 290 mcg dialy; colace twice daily and will change the miralax to twice daily   6. Chronic back pain: he is presently stable will continue methadone 15 mg twice daily with oxycodone 10 mg every 8 hours as needed and 5 mg every 4 hours as needed; has ultram 50 mg every 6 hours as needed. Takes robaxon 500 mg three times daily will not make changes   7. Anxiety disorder: will continue lexapro 10 mg daily;  depakote 250 mg nightly and lamictal 100 mg daily to stabilize mood; will conitnue ativan 0.5 mg every 8 hours as needed     Ok Edwards NP [facility]  Contact [phone] Monday through Friday 8am- 5pm  After hours call [phone]

## 2014-03-04 ENCOUNTER — Encounter: Payer: Self-pay | Admitting: Internal Medicine

## 2014-03-11 ENCOUNTER — Emergency Department (HOSPITAL_COMMUNITY)
Admission: EM | Admit: 2014-03-11 | Discharge: 2014-03-11 | Disposition: A | Discharge status: Home / Self Care | Payer: Medicare Other | Attending: Emergency Medicine | Admitting: Emergency Medicine

## 2014-03-11 ENCOUNTER — Encounter (HOSPITAL_COMMUNITY): Payer: Self-pay | Admitting: *Deleted

## 2014-03-11 DIAGNOSIS — R4689 Other symptoms and signs involving appearance and behavior: Secondary | ICD-10-CM

## 2014-03-11 DIAGNOSIS — K5900 Constipation, unspecified: Secondary | ICD-10-CM | POA: Insufficient documentation

## 2014-03-11 DIAGNOSIS — K219 Gastro-esophageal reflux disease without esophagitis: Secondary | ICD-10-CM | POA: Diagnosis not present

## 2014-03-11 DIAGNOSIS — I10 Essential (primary) hypertension: Secondary | ICD-10-CM | POA: Diagnosis not present

## 2014-03-11 DIAGNOSIS — F0391 Unspecified dementia with behavioral disturbance: Secondary | ICD-10-CM | POA: Diagnosis not present

## 2014-03-11 DIAGNOSIS — E039 Hypothyroidism, unspecified: Secondary | ICD-10-CM | POA: Diagnosis not present

## 2014-03-11 DIAGNOSIS — G8929 Other chronic pain: Secondary | ICD-10-CM | POA: Insufficient documentation

## 2014-03-11 DIAGNOSIS — F911 Conduct disorder, childhood-onset type: Secondary | ICD-10-CM | POA: Insufficient documentation

## 2014-03-11 DIAGNOSIS — M1990 Unspecified osteoarthritis, unspecified site: Secondary | ICD-10-CM | POA: Insufficient documentation

## 2014-03-11 DIAGNOSIS — Z79899 Other long term (current) drug therapy: Secondary | ICD-10-CM | POA: Insufficient documentation

## 2014-03-11 DIAGNOSIS — N3090 Cystitis, unspecified without hematuria: Secondary | ICD-10-CM | POA: Diagnosis not present

## 2014-03-11 DIAGNOSIS — F419 Anxiety disorder, unspecified: Secondary | ICD-10-CM | POA: Insufficient documentation

## 2014-03-11 DIAGNOSIS — F03918 Unspecified dementia, unspecified severity, with other behavioral disturbance: Secondary | ICD-10-CM | POA: Diagnosis present

## 2014-03-11 DIAGNOSIS — R451 Restlessness and agitation: Secondary | ICD-10-CM | POA: Diagnosis present

## 2014-03-11 LAB — URINE MICROSCOPIC-ADD ON

## 2014-03-11 LAB — CBC WITH DIFFERENTIAL/PLATELET
BASOS ABS: 0 10*3/uL (ref 0.0–0.1)
Basophils Relative: 0 % (ref 0–1)
EOS ABS: 0.1 10*3/uL (ref 0.0–0.7)
EOS PCT: 3 % (ref 0–5)
HCT: 37.6 % — ABNORMAL LOW (ref 39.0–52.0)
Hemoglobin: 12.3 g/dL — ABNORMAL LOW (ref 13.0–17.0)
Lymphocytes Relative: 21 % (ref 12–46)
Lymphs Abs: 0.7 10*3/uL (ref 0.7–4.0)
MCH: 35.4 pg — AB (ref 26.0–34.0)
MCHC: 32.7 g/dL (ref 30.0–36.0)
MCV: 108.4 fL — AB (ref 78.0–100.0)
Monocytes Absolute: 0.3 10*3/uL (ref 0.1–1.0)
Monocytes Relative: 10 % (ref 3–12)
NEUTROS PCT: 66 % (ref 43–77)
Neutro Abs: 2.2 10*3/uL (ref 1.7–7.7)
PLATELETS: 165 10*3/uL (ref 150–400)
RBC: 3.47 MIL/uL — ABNORMAL LOW (ref 4.22–5.81)
RDW: 13.1 % (ref 11.5–15.5)
WBC: 3.3 10*3/uL — ABNORMAL LOW (ref 4.0–10.5)

## 2014-03-11 LAB — COMPREHENSIVE METABOLIC PANEL
ALT: 10 U/L (ref 0–53)
AST: 24 U/L (ref 0–37)
Albumin: 3.7 g/dL (ref 3.5–5.2)
Alkaline Phosphatase: 56 U/L (ref 39–117)
Anion gap: 11 (ref 5–15)
BILIRUBIN TOTAL: 0.4 mg/dL (ref 0.3–1.2)
BUN: 35 mg/dL — ABNORMAL HIGH (ref 6–23)
CHLORIDE: 106 meq/L (ref 96–112)
CO2: 26 mEq/L (ref 19–32)
Calcium: 9.2 mg/dL (ref 8.4–10.5)
Creatinine, Ser: 1.2 mg/dL (ref 0.50–1.35)
GFR, EST AFRICAN AMERICAN: 61 mL/min — AB (ref >90)
GFR, EST NON AFRICAN AMERICAN: 53 mL/min — AB (ref >90)
GLUCOSE: 131 mg/dL — AB (ref 70–99)
Potassium: 4.3 mEq/L (ref 3.7–5.3)
Sodium: 143 mEq/L (ref 137–147)
Total Protein: 6.3 g/dL (ref 6.0–8.3)

## 2014-03-11 LAB — URINALYSIS, ROUTINE W REFLEX MICROSCOPIC
Glucose, UA: NEGATIVE mg/dL
Hgb urine dipstick: NEGATIVE
Ketones, ur: NEGATIVE mg/dL
Nitrite: NEGATIVE
PH: 5.5 (ref 5.0–8.0)
Protein, ur: NEGATIVE mg/dL
Specific Gravity, Urine: 1.027 (ref 1.005–1.030)
Urobilinogen, UA: 1 mg/dL (ref 0.0–1.0)

## 2014-03-11 LAB — RAPID URINE DRUG SCREEN, HOSP PERFORMED
Amphetamines: NOT DETECTED
Barbiturates: NOT DETECTED
Benzodiazepines: NOT DETECTED
Cocaine: NOT DETECTED
OPIATES: NOT DETECTED
Tetrahydrocannabinol: NOT DETECTED

## 2014-03-11 LAB — VALPROIC ACID LEVEL

## 2014-03-11 MED ORDER — LEVOTHYROXINE SODIUM 88 MCG PO TABS
88.0000 ug | ORAL_TABLET | Freq: Every day | ORAL | Status: DC
  Filled 2014-03-11: qty 1

## 2014-03-11 MED ORDER — CEPHALEXIN 500 MG PO CAPS: 500.0000 mg | ORAL_CAPSULE | Freq: Four times a day (QID) | ORAL | Status: DC

## 2014-03-11 MED ORDER — LAMOTRIGINE 25 MG PO TABS
125.0000 mg | ORAL_TABLET | Freq: Every day | ORAL | Status: DC
  Administered 2014-03-11: 125 mg via ORAL
  Filled 2014-03-11: qty 5

## 2014-03-11 MED ORDER — ZOLPIDEM TARTRATE 5 MG PO TABS: 5.0000 mg | ORAL_TABLET | Freq: Every evening | ORAL | Status: DC | PRN

## 2014-03-11 MED ORDER — LINACLOTIDE 290 MCG PO CAPS
290.0000 ug | ORAL_CAPSULE | Freq: Every day | ORAL | Status: DC
  Administered 2014-03-11: 290 ug via ORAL
  Filled 2014-03-11: qty 1

## 2014-03-11 MED ORDER — PANTOPRAZOLE SODIUM 40 MG PO TBEC
80.0000 mg | DELAYED_RELEASE_TABLET | Freq: Every day | ORAL | Status: DC
Start: 2014-03-11 — End: 2014-03-11
  Administered 2014-03-11: 80 mg via ORAL
  Filled 2014-03-11: qty 2

## 2014-03-11 MED ORDER — TAMSULOSIN HCL 0.4 MG PO CAPS: 0.4000 mg | ORAL_CAPSULE | Freq: Every day | ORAL | Status: DC

## 2014-03-11 MED ORDER — ESCITALOPRAM OXALATE 10 MG PO TABS: 10.0000 mg | ORAL_TABLET | Freq: Every day | ORAL | Status: DC

## 2014-03-11 MED ORDER — POLYETHYLENE GLYCOL 3350 17 G PO PACK
17.0000 g | PACK | Freq: Every day | ORAL | Status: DC
  Administered 2014-03-11: 17 g via ORAL
  Filled 2014-03-11: qty 1

## 2014-03-11 MED ORDER — OXYCODONE HCL 5 MG PO TABS: 10.0000 mg | ORAL_TABLET | ORAL | Status: DC | PRN

## 2014-03-11 MED ORDER — RIVASTIGMINE 13.3 MG/24HR TD PT24
1.0000 | MEDICATED_PATCH | Freq: Every morning | TRANSDERMAL | Status: DC
  Administered 2014-03-11: 13.3 mg via TRANSDERMAL
  Filled 2014-03-11: qty 1

## 2014-03-11 MED ORDER — DOCUSATE SODIUM 100 MG PO CAPS
100.0000 mg | ORAL_CAPSULE | Freq: Two times a day (BID) | ORAL | Status: DC
  Administered 2014-03-11: 100 mg via ORAL
  Filled 2014-03-11: qty 1

## 2014-03-11 MED ORDER — METHADONE HCL 5 MG PO TABS
15.0000 mg | ORAL_TABLET | Freq: Two times a day (BID) | ORAL | Status: DC
  Administered 2014-03-11: 15 mg via ORAL
  Filled 2014-03-11 (×2): qty 1

## 2014-03-11 MED ORDER — LORAZEPAM 0.5 MG PO TABS: 0.5000 mg | ORAL_TABLET | Freq: Three times a day (TID) | ORAL | Status: DC | PRN

## 2014-03-11 MED ORDER — METHOCARBAMOL 500 MG PO TABS
500.0000 mg | ORAL_TABLET | Freq: Two times a day (BID) | ORAL | Status: DC
  Administered 2014-03-11: 500 mg via ORAL
  Filled 2014-03-11: qty 1

## 2014-03-11 MED ORDER — DUTASTERIDE 0.5 MG PO CAPS
0.5000 mg | ORAL_CAPSULE | Freq: Every day | ORAL | Status: DC
  Administered 2014-03-11: 0.5 mg via ORAL
  Filled 2014-03-11: qty 1

## 2014-03-11 NOTE — ED Notes (Signed)
Pt observed sleeping.  Pt's respirations easy and unlabored.  NAD noted.

## 2014-03-11 NOTE — Progress Notes (Addendum)
Pt from [facility]. CSW spoke with [facility] about patient disposition. Per EDP, patient recommended to dc back to skilled nursing facility with antibiotic for possible UTI. CSW awaiting return call from [facility].   Noreene Larsson [phone]  ED CSW 03/11/2014 8:29 AM Per psychiatry, patient ready for dc back to [facility].   Noreene Larsson [phone]  ED CSW 03/11/2014 11:11 AM

## 2014-03-11 NOTE — Discharge Instructions (Signed)
There are some signs of a possible Urinary tract infection.  He has an antibiotic prescribed for this which he should continue even if symptoms improve.   Dementia Dementia is a general term for problems with brain function. A person with dementia has memory loss and a hard time with at least one other brain function such as thinking, speaking, or problem solving. Dementia can affect social functioning, how you do your job, your mood, or your personality. The changes may be hidden for a long time. The earliest forms of this disease are usually not detected by family or friends. Dementia can be:  Irreversible.  Potentially reversible.  Partially reversible.  Progressive. This means it can get worse over time. CAUSES  Irreversible dementia causes may include:  Degeneration of brain cells (Alzheimer disease or Lewy body dementia).  Multiple small strokes (vascular dementia).  Infection (chronic meningitis or Creutzfeldt-Jakob disease).  Frontotemporal dementia. This affects younger people, age 65 to 42, compared to those who have Alzheimer disease.  Dementia associated with other disorders like Parkinson disease, Huntington disease, or HIV-associated dementia. Potentially or partially reversible dementia causes may include:  Medicines.  Metabolic causes such as excessive alcohol intake, vitamin B12 deficiency, or thyroid disease.  Masses or pressure in the brain such as a tumor, blood clot, or hydrocephalus. SIGNS AND SYMPTOMS  Symptoms are often hard to detect. Family members or coworkers may not notice them early in the disease process. Different people with dementia may have different symptoms. Symptoms can include:  A hard time with memory, especially recent memory. Long-term memory may not be impaired.  Asking the same question multiple times or forgetting something someone just said.  A hard time speaking your thoughts or finding certain words.  A hard time solving problems  or performing familiar tasks (such as how to use a telephone).  Sudden changes in mood.  Changes in personality, especially increasing moodiness or mistrust.  Depression.  A hard time understanding complex ideas that were never a problem in the past. DIAGNOSIS  There are no specific tests for dementia.   Your health care provider may recommend a thorough evaluation. This is because some forms of dementia can be reversible. The evaluation will likely include a physical exam and getting a detailed history from you and a family member. The history often gives the best clues and suggestions for a diagnosis.  Memory testing may be done. A detailed brain function evaluation called neuropsychologic testing may be helpful.  Lab tests and brain imaging (such as a CT scan or MRI scan) are sometimes important.  Sometimes observation and re-evaluation over time is very helpful. TREATMENT  Treatment depends on the cause.   If the problem is a vitamin deficiency, it may be helped or cured with supplements.  For dementias such as Alzheimer disease, medicines are available to stabilize or slow the course of the disease. There are no cures for this type of dementia.  Your health care provider can help direct you to groups, organizations, and other health care providers to help with decisions in the care of you or your loved one. HOME CARE INSTRUCTIONS The care of individuals with dementia is varied and dependent upon the progression of the dementia. The following suggestions are intended for the person living with, or caring for, the person with dementia.  Create a safe environment.  Remove the locks on bathroom doors to prevent the person from accidentally locking himself or herself in.  Use childproof latches on kitchen cabinets  and any place where cleaning supplies, chemicals, or alcohol are kept.  Use childproof covers in unused electrical outlets.  Install childproof devices to keep doors  and windows secured.  Remove stove knobs or install safety knobs and an automatic shut-off on the stove.  Lower the temperature on water heaters.  Label medicines and keep them locked up.  Secure knives, lighters, matches, power tools, and guns, and keep these items out of reach.  Keep the house free from clutter. Remove rugs or anything that might contribute to a fall.  Remove objects that might break and hurt the person.  Make sure lighting is good, both inside and outside.  Install grab rails as needed.  Use a monitoring device to alert you to falls or other needs for help.  Reduce confusion.  Keep familiar objects and people around.  Use night lights or dim lights at night.  Label items or areas.  Use reminders, notes, or directions for daily activities or tasks.  Keep a simple, consistent routine for waking, meals, bathing, dressing, and bedtime.  Create a calm, quiet environment.  Place large clocks and calendars prominently.  Display emergency numbers and home address near all telephones.  Use cues to establish different times of the day. An example is to open curtains to let the natural light in during the day.   Use effective communication.  Choose simple words and short sentences.  Use a gentle, calm tone of voice.  Be careful not to interrupt.  If the person is struggling to find a word or communicate a thought, try to provide the word or thought.  Ask one question at a time. Allow the person ample time to answer questions. Repeat the question again if the person does not respond.  Reduce nighttime restlessness.  Provide a comfortable bed.  Have a consistent nighttime routine.  Ensure a regular walking or physical activity schedule. Involve the person in daily activities as much as possible.  Limit napping during the day.  Limit caffeine.  Attend social events that stimulate rather than overwhelm the senses.  Encourage good nutrition and  hydration.  Reduce distractions during meal times and snacks.  Avoid foods that are too hot or too cold.  Monitor chewing and swallowing ability.  Continue with routine vision, hearing, dental, and medical screenings.  Give medicines only as directed by the health care provider.  Monitor driving abilities. Do not allow the person to drive when safe driving is no longer possible.  Register with an identification program which could provide location assistance in the event of a missing person situation. SEEK MEDICAL CARE IF:   New behavioral problems start such as moodiness, aggressiveness, or seeing things that are not there (hallucinations).  Any new problem with brain function happens. This includes problems with balance, speech, or falling a lot.  Problems with swallowing develop.  Any symptoms of other illness happen. Small changes or worsening in any aspect of brain function can be a sign that the illness is getting worse. It can also be a sign of another medical illness such as infection. Seeing a health care provider right away is important. SEEK IMMEDIATE MEDICAL CARE IF:   A fever develops.  New or worsened confusion develops.  New or worsened sleepiness develops.  Staying awake becomes hard to do. Document Released: 09/01/2000 Document Revised: 07/23/2013 Document Reviewed: 08/03/2010 [facility] Patient Information 2015 [place], [place]. This information is not intended to replace advice given to you by your health care provider. Make  sure you discuss any questions you have with your health care provider.
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
## 2014-03-11 NOTE — ED Provider Notes (Signed)
  Physical Exam  BP 134/71 mmHg  Pulse 88  Temp(Src) 98.4 F (36.9 C) (Oral)  Resp 19  SpO2 100%  Physical Exam  ED Course  Procedures  MDM Assumed care of pt from Dr. Eliane Decree in stable condition.  Essentially, the pt became more aggressive so was sent for futher evaluation.  Medical wu remarkable for trace leuks, unlikely the etiology of the pt's aggression.  He was cleared by psychiatry after this wu.  DC home in stable condition.      Debby Freiberg, MD 03/11/14 [phone]

## 2014-03-11 NOTE — Consult Note (Addendum)
[facility] Face-to-Face Psychiatry Consult   Reason for Consult:  Agitation, dementia with behavioral disturbance Referring Physician:  EDP  George Foster is an 80 y.o. male. Total Time spent with patient: 45 minutes  Assessment: AXIS I:  Dementia with behavioral disturbance AXIS II:  Deferred AXIS III:   Past Medical History  Diagnosis Date  . Dementia   . Hypertension   . Arthritis   . Hypothyroidism   . Chronic back pain   . Anxiety disorder   . Gastroesophageal reflux disease   . Polypharmacy     History of hospitalizations for altered mental status related to medications  . Chronic constipation     with fecal impactions, recurrent  . Suicidal ideation 06/06/2011   AXIS IV:  other psychosocial or environmental problems and problems related to social environment AXIS V:  61-70 mild symptoms  Plan:  No evidence of imminent risk to self or others at present.    Subjective:   George Foster is a 80 y.o. male patient does not warrant admission, return to nursing home.  HPI:  The patient stated "I was a mess last night."  He had gotten upset with his nursing facility because he reported he wanted food and something drink and he had not had anything to drink in two hours.  Calton got "angry" and acted out.  The nursing facility reports prior episodes like this especially when he had an urinary tract infection.  He does have a trace of one that the EDP will treat at this time.  Today, the patient is calm and cooperative, not suicidal or homicidal, no alcohol or drug abuse, no hallucination. HPI Elements:   Location:  generalized. Quality:  acute. Severity:  mild. Timing:  intermittent. Duration:  brief. Context:  stressors.  Past Psychiatric History: Past Medical History  Diagnosis Date  . Dementia   . Hypertension   . Arthritis   . Hypothyroidism   . Chronic back pain   . Anxiety disorder   . Gastroesophageal reflux disease   . Polypharmacy     History of hospitalizations for  altered mental status related to medications  . Chronic constipation     with fecal impactions, recurrent  . Suicidal ideation 06/06/2011    reports that he has never smoked. He has never used smokeless tobacco. He reports that he does not drink alcohol or use illicit drugs. Family History  Problem Relation Age of Onset  . Heart disease Father            Allergies:  No Known Allergies  ACT Assessment Complete:  Yes:    Educational Status    Risk to Self: Risk to self with the past 6 months Is patient at risk for suicide?: No  Risk to Others:    Abuse:    Prior Inpatient Therapy:    Prior Outpatient Therapy:    Additional Information:                    Objective: Blood pressure 147/66, pulse 73, temperature 98.4 F (36.9 C), temperature source Oral, resp. rate 18, SpO2 97 %.There is no weight on file to calculate BMI. Results for orders placed or performed during the hospital encounter of 03/11/14 (from the past 72 hour(s))  Comprehensive metabolic panel     Status: Abnormal   Collection Time: 03/11/14  5:49 AM  Result Value Ref Range   Sodium 143 137 - 147 mEq/L   Potassium 4.3 3.7 - 5.3 mEq/L  Chloride 106 96 - 112 mEq/L   CO2 26 19 - 32 mEq/L   Glucose, Bld 131 (H) 70 - 99 mg/dL   BUN 35 (H) 6 - 23 mg/dL   Creatinine, Ser 1.20 0.50 - 1.35 mg/dL   Calcium 9.2 8.4 - 10.5 mg/dL   Total Protein 6.3 6.0 - 8.3 g/dL   Albumin 3.7 3.5 - 5.2 g/dL   AST 24 0 - 37 U/L   ALT 10 0 - 53 U/L   Alkaline Phosphatase 56 39 - 117 U/L   Total Bilirubin 0.4 0.3 - 1.2 mg/dL   GFR calc non Af Amer 53 (L) >90 mL/min   GFR calc Af Amer 61 (L) >90 mL/min    Comment: (NOTE) The eGFR has been calculated using the CKD EPI equation. This calculation has not been validated in all clinical situations. eGFR's persistently <90 mL/min signify possible Chronic Kidney Disease.    Anion gap 11 5 - 15  CBC with Differential     Status: Abnormal   Collection Time: 03/11/14  5:49 AM   Result Value Ref Range   WBC 3.3 (L) 4.0 - 10.5 K/uL   RBC 3.47 (L) 4.22 - 5.81 MIL/uL   Hemoglobin 12.3 (L) 13.0 - 17.0 g/dL   HCT 37.6 (L) 39.0 - 52.0 %   MCV 108.4 (H) 78.0 - 100.0 fL   MCH 35.4 (H) 26.0 - 34.0 pg   MCHC 32.7 30.0 - 36.0 g/dL   RDW 13.1 11.5 - 15.5 %   Platelets 165 150 - 400 K/uL   Neutrophils Relative % 66 43 - 77 %   Neutro Abs 2.2 1.7 - 7.7 K/uL   Lymphocytes Relative 21 12 - 46 %   Lymphs Abs 0.7 0.7 - 4.0 K/uL   Monocytes Relative 10 3 - 12 %   Monocytes Absolute 0.3 0.1 - 1.0 K/uL   Eosinophils Relative 3 0 - 5 %   Eosinophils Absolute 0.1 0.0 - 0.7 K/uL   Basophils Relative 0 0 - 1 %   Basophils Absolute 0.0 0.0 - 0.1 K/uL  Valproic acid level     Status: Abnormal   Collection Time: 03/11/14  5:49 AM  Result Value Ref Range   Valproic Acid Lvl <10.0 (L) 50.0 - 100.0 ug/mL    Comment: Performed at [facility]  Urinalysis, Routine w reflex microscopic     Status: Abnormal   Collection Time: 03/11/14  5:50 AM  Result Value Ref Range   Color, Urine YELLOW YELLOW   APPearance CLEAR CLEAR   Specific Gravity, Urine 1.027 1.005 - 1.030   pH 5.5 5.0 - 8.0   Glucose, UA NEGATIVE NEGATIVE mg/dL   Hgb urine dipstick NEGATIVE NEGATIVE   Bilirubin Urine SMALL (A) NEGATIVE   Ketones, ur NEGATIVE NEGATIVE mg/dL   Protein, ur NEGATIVE NEGATIVE mg/dL   Urobilinogen, UA 1.0 0.0 - 1.0 mg/dL   Nitrite NEGATIVE NEGATIVE   Leukocytes, UA TRACE (A) NEGATIVE  Urine rapid drug screen (hosp performed)     Status: None   Collection Time: 03/11/14  5:50 AM  Result Value Ref Range   Opiates NONE DETECTED NONE DETECTED   Cocaine NONE DETECTED NONE DETECTED   Benzodiazepines NONE DETECTED NONE DETECTED   Amphetamines NONE DETECTED NONE DETECTED   Tetrahydrocannabinol NONE DETECTED NONE DETECTED   Barbiturates NONE DETECTED NONE DETECTED    Comment:        DRUG SCREEN FOR MEDICAL PURPOSES ONLY.  IF CONFIRMATION IS NEEDED FOR  ANY PURPOSE, NOTIFY LAB WITHIN 5  DAYS.        LOWEST DETECTABLE LIMITS FOR URINE DRUG SCREEN Drug Class       Cutoff (ng/mL) Amphetamine      1000 Barbiturate      200 Benzodiazepine   951 Tricyclics       884 Opiates          300 Cocaine          300 THC              50   Urine microscopic-add on     Status: Abnormal   Collection Time: 03/11/14  5:50 AM  Result Value Ref Range   Squamous Epithelial / LPF RARE RARE   WBC, UA 0-2 <3 WBC/hpf   RBC / HPF 0-2 <3 RBC/hpf   Bacteria, UA RARE RARE   Crystals CA OXALATE CRYSTALS (A) NEGATIVE   Urine-Other MUCOUS PRESENT    Labs are reviewed and are pertinent for medical issues being treated.  Current Facility-Administered Medications  Medication Dose Route Frequency Provider Last Rate Last Dose  . docusate sodium (COLACE) capsule 100 mg  100 mg Oral BID Janice Norrie, MD   100 mg at 03/11/14 1120  . dutasteride (AVODART) capsule 0.5 mg  0.5 mg Oral Daily Janice Norrie, MD   0.5 mg at 03/11/14 1119  . escitalopram (LEXAPRO) tablet 10 mg  10 mg Oral QHS Janice Norrie, MD      . lamoTRIgine (LAMICTAL) tablet 125 mg  125 mg Oral Daily Janice Norrie, MD   125 mg at 03/11/14 1120  . levothyroxine (SYNTHROID, LEVOTHROID) tablet 88 mcg  88 mcg Oral QHS Janice Norrie, MD      . Linaclotide Rolan Lipa) capsule 290 mcg  290 mcg Oral Daily Janice Norrie, MD   290 mcg at 03/11/14 1119  . LORazepam (ATIVAN) tablet 0.5 mg  0.5 mg Oral Q8H PRN Janice Norrie, MD      . methadone (DOLOPHINE) tablet 15 mg  15 mg Oral Q12H Janice Norrie, MD   15 mg at 03/11/14 1119  . methocarbamol (ROBAXIN) tablet 500 mg  500 mg Oral BID Janice Norrie, MD   500 mg at 03/11/14 1118  . oxyCODONE (Oxy IR/ROXICODONE) immediate release tablet 10 mg  10 mg Oral Q4H PRN Janice Norrie, MD      . pantoprazole (PROTONIX) EC tablet 80 mg  80 mg Oral Daily Janice Norrie, MD   80 mg at 03/11/14 1119  . polyethylene glycol (MIRALAX / GLYCOLAX) packet 17 g  17 g Oral Daily Janice Norrie, MD   17 g at 03/11/14 1121  . Rivastigmine PT24 13.3 mg   1 patch Transdermal q morning - 10a Iva L Knapp, MD   13.3 mg at 03/11/14 1121  . tamsulosin (FLOMAX) capsule 0.4 mg  0.4 mg Oral QHS Janice Norrie, MD      . zolpidem (AMBIEN) tablet 5 mg  5 mg Oral QHS PRN,MR X 1 Janice Norrie, MD       Current Outpatient Prescriptions  Medication Sig Dispense Refill  . acetaminophen (TYLENOL) 325 MG tablet Take 650 mg by mouth every 12 (twelve) hours as needed for mild pain.     Marland Kitchen docusate sodium (COLACE) 100 MG capsule Take 100 mg by mouth 2 (two) times daily.    Marland Kitchen dutasteride (AVODART) 0.5 MG capsule Take 0.5 mg by mouth every morning.     Marland Kitchen  escitalopram (LEXAPRO) 10 MG tablet Take 10 mg by mouth at bedtime.     . eszopiclone (LUNESTA) 1 MG TABS tablet Take 1 mg by mouth at bedtime as needed for sleep. Take immediately before bedtime    . guaifenesin (ROBITUSSIN) 100 MG/5ML syrup Take 200 mg by mouth every 4 (four) hours as needed for cough.    . LamoTRIgine 50 MG TBDP Take 125 mg by mouth daily.     Marland Kitchen levothyroxine (SYNTHROID, LEVOTHROID) 88 MCG tablet Take 88 mcg by mouth at bedtime.    . Linaclotide (LINZESS) 290 MCG CAPS capsule Take 290 mcg by mouth every morning.     Marland Kitchen LORazepam (ATIVAN) 0.5 MG tablet Take one tablet by mouth every 8 hours as needed for anixety 90 tablet 5  . methadone (DOLOPHINE) 10 MG tablet Take 15 mg by mouth every 12 (twelve) hours. Take one and one-half tablets to equal 15 mg by mouth every 12 hours for pain    . methocarbamol (ROBAXIN) 500 MG tablet Take 500 mg by mouth 2 (two) times daily.    . Multiple Vitamins-Minerals (MULTIVITAMINS THER. W/MINERALS) TABS Take 1 tablet by mouth daily.    Marland Kitchen omeprazole (PRILOSEC) 40 MG capsule Take 40 mg by mouth daily.    Marland Kitchen oxyCODONE (OXY IR/ROXICODONE) 5 MG immediate release tablet Take 5-10 mg by mouth every 4 (four) hours as needed for severe pain. 10 mg every 8 hours as needed 5 mg every 4 hours as needed    . polyethylene glycol (MIRALAX / GLYCOLAX) packet Take 17 g by mouth daily. 14 each  11  . Rivastigmine 13.3 MG/24HR PT24 Place 1 patch onto the skin every morning.    . tamsulosin (FLOMAX) 0.4 MG CAPS Take 0.4 mg by mouth at bedtime.     . traMADol (ULTRAM) 50 MG tablet Take 2 tablets (100 mg total) by mouth every 6 (six) hours. 40 tablet 0  . cephALEXin (KEFLEX) 500 MG capsule Take 1 capsule (500 mg total) by mouth 4 (four) times daily. 20 capsule 0    Psychiatric Specialty Exam:     Blood pressure 147/66, pulse 73, temperature 98.4 F (36.9 C), temperature source Oral, resp. rate 18, SpO2 97 %.There is no weight on file to calculate BMI.  General Appearance: Casual  Eye Contact::  Fair  Speech:  Normal Rate  Volume:  Normal  Mood:  Euthymic  Affect:  Congruent  Thought Process:  Coherent  Orientation:  Full (Time, Place, and Person)  Thought Content:  WDL  Suicidal Thoughts:  No  Homicidal Thoughts:  No  Memory:  Immediate;   Fair Recent;   Fair Remote;   Fair  Judgement:  Fair  Insight:  Fair  Psychomotor Activity:  Decreased  Concentration:  Fair  Recall:  [employer] of Knowledge:Fair  Language: Fair  Akathisia:  No  Handed:  Right  AIMS (if indicated):     Assets:  Financial Resources/Insurance Housing Leisure Time Resilience Social Support  Sleep:      Musculoskeletal: Strength & Muscle Tone: within normal limits Gait & Station: normal Patient leans: N/A  Treatment Plan Summary: Discharge home to his group home with a Rx for his UTI.  Waylan Boga, [place] 03/11/2014 12:20 PM  Patient seen, evaluated and I agree with notes by Nurse Practitioner. Corena Pilgrim, MD

## 2014-03-11 NOTE — ED Notes (Signed)
Pt states he's having neck and back pain, states it's chronic, pt states he has the shakes from the nerve damage, pt resting in bed at this time, showing to aggressive behavior.

## 2014-03-11 NOTE — [facility] Suicide Risk Assessment (Signed)
Suicide Risk Assessment  Discharge Assessment     Demographic Factors:  Male and Age 78 or older  Total Time spent with patient: 45 minutes  Psychiatric Specialty Exam:     Blood pressure 147/66, pulse 73, temperature 98.4 F (36.9 C), temperature source Oral, resp. rate 18, SpO2 97 %.There is no weight on file to calculate BMI.  General Appearance: Casual  Eye Contact::  Fair  Speech:  Normal Rate  Volume:  Normal  Mood:  Euthymic  Affect:  Congruent  Thought Process:  Coherent  Orientation:  Full (Time, Place, and Person)  Thought Content:  WDL  Suicidal Thoughts:  No  Homicidal Thoughts:  No  Memory:  Immediate;   Fair Recent;   Fair Remote;   Fair  Judgement:  Fair  Insight:  Fair  Psychomotor Activity:  Decreased  Concentration:  Fair  Recall:  [employer] of Knowledge:Fair  Language: Fair  Akathisia:  No  Handed:  Right  AIMS (if indicated):     Assets:  Catering manager Housing Leisure Time Resilience Social Support  Sleep:      Musculoskeletal: Strength & Muscle Tone: within normal limits Gait & Station: normal Patient leans: N/A  Mental Status Per Nursing Assessment::   On Admission:   Agitated  Current Mental Status by Physician: NA  Loss Factors: NA  Historical Factors: NA  Risk Reduction Factors:   Sense of responsibility to family, Living with another person, especially a relative and Positive social support  Continued Clinical Symptoms:  None  Cognitive Features That Contribute To Risk:  None  Suicide Risk:  Minimal: No identifiable suicidal ideation.  Patients presenting with no risk factors but with morbid ruminations; may be classified as minimal risk based on the severity of the depressive symptoms  Discharge Diagnoses:   AXIS I:  Dementia, unspecified type AXIS II:  Deferred AXIS III:   Past Medical History  Diagnosis Date  . Dementia   . Hypertension   . Arthritis   . Hypothyroidism   . Chronic back  pain   . Anxiety disorder   . Gastroesophageal reflux disease   . Polypharmacy     History of hospitalizations for altered mental status related to medications  . Chronic constipation     with fecal impactions, recurrent  . Suicidal ideation 06/06/2011   AXIS IV:  other psychosocial or environmental problems and problems related to social environment AXIS V:  61-70 mild symptoms  Plan Of Care/Follow-up recommendations:  Activity:  as tolerated Diet:  heart healthy diet  Is patient on multiple antipsychotic therapies at discharge:  No   Has Patient had three or more failed trials of antipsychotic monotherapy by history:  No  Recommended Plan for Multiple Antipsychotic Therapies: NA    Delmi Fulfer, PMH-NP 03/11/2014, 12:26 PM

## 2014-03-11 NOTE — ED Provider Notes (Signed)
CSN: [id number]     Arrival date & time 03/11/14  1771 History   First MD Initiated Contact with Patient 03/11/14 0457     Chief Complaint  Patient presents with  . Aggressive Behavior     (Consider location/radiation/quality/duration/timing/severity/associated sxs/prior Treatment)  Level V caveat for dementia  HPI  Patient brought to the ED tonight from his nursing home by EMS.  Patient states that tonight he had been asking his nurses to bring him some water or food. He states he waited over 2 hours and then he got extremely upset. Per EMS they were called because patient was trying to punch and hit the staff. Patient states he's no longer mad. His only complaint to me is of chronic pain in his neck that he has had for a long time. He  states he hasn't eaten in 2 days. He also states he hasn't urinated in 2 days. He however states he needs to urinate now. 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  EMS gave the patient Haldol 5 mg.  PCP Dr Bubba Camp  Past Medical History  Diagnosis Date  . Dementia   . Hypertension   . Arthritis   . Hypothyroidism   . Chronic back pain   . Anxiety disorder   . Gastroesophageal reflux disease   . Polypharmacy     History of hospitalizations for altered mental status related to medications  . Chronic constipation     with fecal impactions, recurrent  . Suicidal ideation 06/06/2011   Past Surgical History  Procedure Laterality Date  . Appendectomy    . Removal of nonrestorable teeth numbers 2, 4, 6, 12, 13, 18, 29     Family History  Problem Relation Age of Onset  . Heart disease Father    History  Substance Use Topics  . Smoking status: Never Smoker   . Smokeless tobacco: Never Used  . 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  Home Medications   Prior to Admission medications   Medication Sig Start Date End Date Taking? Authorizing Provider  acetaminophen (TYLENOL) 325 MG tablet Take 650 mg by mouth every 12 (twelve) hours as needed for mild pain.     Historical Provider, MD  divalproex (DEPAKOTE SPRINKLE) 125 MG capsule Take 250 mg by mouth at bedtime.     Historical Provider, MD  docusate sodium (COLACE) 100 MG capsule Take 100 mg by mouth 2 (two) times daily.    Historical Provider, MD  dutasteride (AVODART) 0.5 MG capsule Take 0.5 mg by mouth every morning.     Historical Provider, MD  escitalopram (LEXAPRO) 10 MG tablet Take 10 mg by mouth at bedtime.     Historical Provider, MD  eszopiclone (LUNESTA) 1 MG TABS tablet Take 1 mg by mouth at bedtime as needed for sleep. Take immediately before bedtime    Historical Provider, MD  guaifenesin (ROBITUSSIN) 100 MG/5ML syrup Take 200 mg by mouth every 4 (four) hours as needed for cough.    Historical Provider, MD  LamoTRIgine 50 MG TBDP Take 2 tablets by mouth daily.     Historical Provider, MD  levothyroxine (SYNTHROID, LEVOTHROID) 88 MCG tablet Take 88 mcg by mouth at bedtime.  Historical Provider, MD  Linaclotide Rolan Lipa) 290 MCG CAPS capsule Take 290 mcg by mouth every morning.     Historical Provider, MD  LORazepam (ATIVAN) 0.5 MG tablet Take one tablet by mouth every 8 hours as needed for anixety 11/22/13   Tiffany L Reed, DO  methadone (DOLOPHINE) 10 MG tablet 15 mg. Take one and one-half tablets to equal 15 mg by mouth every 12 hours for pain 12/18/13   Blanchie Serve, MD  methocarbamol (ROBAXIN) 500 MG tablet Take 500 mg by mouth at bedtime.    Historical Provider, MD  methocarbamol (ROBAXIN) 500 MG tablet Take 500 mg by mouth 2 (two) times daily.    Historical Provider, MD  Multiple Vitamins-Minerals (MULTIVITAMINS THER. W/MINERALS) TABS Take 1 tablet by mouth daily.    Historical Provider, MD  omeprazole (PRILOSEC) 40 MG capsule Take 40 mg by  mouth daily.    Historical Provider, MD  oxyCODONE (OXY IR/ROXICODONE) 5 MG immediate release tablet Take 5-10 mg by mouth every 4 (four) hours as needed for severe pain. 10 mg every 8 hours as needed 5 mg every 4 hours as needed    Historical Provider, MD  polyethylene glycol (MIRALAX / GLYCOLAX) packet Take 17 g by mouth daily. 02/24/14   Gerlene Fee, NP  Rivastigmine 13.3 MG/24HR PT24 Place 1 patch onto the skin every morning.    Historical Provider, MD  tamsulosin (FLOMAX) 0.4 MG CAPS Take 0.4 mg by mouth at bedtime.     Historical Provider, MD  traMADol (ULTRAM) 50 MG tablet Take 2 tablets (100 mg total) by mouth every 6 (six) hours. 10/06/13   Earnstine Regal, PA-C   BP 123/78 mmHg  Pulse 93  Temp(Src) 98.4 F (36.9 C) (Oral)  Resp 20  SpO2 95%  Vital signs normal   Physical Exam  Constitutional: He appears well-developed and well-nourished.  Non-toxic appearance. He does not appear ill. No distress.  Patient sleeping, easily arousable  HENT:  Head: Normocephalic and atraumatic.  Right Ear: External ear normal.  Left Ear: External ear normal.  Nose: Nose normal. No mucosal edema or rhinorrhea.  Mouth/Throat: Oropharynx is clear and moist and mucous membranes are normal. No dental abscesses or uvula swelling.  Eyes: Conjunctivae and EOM are normal. Pupils are equal, round, and reactive to light.  Neck: Normal range of motion and full passive range of motion without pain. Neck supple.  Patient is holding his head still.   Cardiovascular: Normal rate, regular rhythm and normal heart sounds.  Exam reveals no gallop and no friction rub.   No murmur heard. Pulmonary/Chest: Effort normal and breath sounds normal. No respiratory distress. He has no wheezes. He has no rhonchi. He has no rales. He exhibits no tenderness and no crepitus.  Abdominal: Soft. Normal appearance and bowel sounds are normal. He exhibits no distension. There is no tenderness. There is no rebound and no  guarding.  Musculoskeletal: Normal range of motion. He exhibits no edema or tenderness.  Moves all extremities well.   Neurological: He is alert. He has normal strength. No cranial nerve deficit.  When asked what year it was patient initially started to say 1900 something, he then said 2016. He does not know what holiday is coming up soon.  Skin: Skin is warm, dry and intact. No rash noted. No erythema. No pallor.  Psychiatric: He has a normal mood and affect. His speech is normal and behavior is normal. His mood appears not anxious.  Nursing note and vitals reviewed.  ED Course  Procedures (including critical care time)  Medications  docusate sodium (COLACE) capsule 100 mg (not administered)  dutasteride (AVODART) capsule 0.5 mg (not administered)  escitalopram (LEXAPRO) tablet 10 mg (not administered)  zolpidem (AMBIEN) tablet 5 mg (not administered)  lamoTRIgine (LAMICTAL) tablet 125 mg (not administered)  levothyroxine (SYNTHROID, LEVOTHROID) tablet 88 mcg (not administered)  Linaclotide (LINZESS) capsule 290 mcg (not administered)  LORazepam (ATIVAN) tablet 0.5 mg (not administered)  methadone (DOLOPHINE) tablet 15 mg (not administered)  methocarbamol (ROBAXIN) tablet 500 mg (not administered)  pantoprazole (PROTONIX) EC tablet 80 mg (not administered)  oxyCODONE (Oxy IR/ROXICODONE) immediate release tablet 10 mg (not administered)  polyethylene glycol (MIRALAX / GLYCOLAX) packet 17 g (not administered)  Rivastigmine PT24 13.3 mg (not administered)  tamsulosin (FLOMAX) capsule 0.4 mg (not administered)    Pt has been sleeping, will have psychiatrist evaluate this am and hopefully discharge back to his NH.   Labs Review Results for orders placed or performed during the hospital encounter of 03/11/14  Urinalysis, Routine w reflex microscopic  Result Value Ref Range   Color, Urine YELLOW YELLOW   APPearance CLEAR CLEAR   Specific Gravity, Urine 1.027 1.005 - 1.030   pH 5.5  5.0 - 8.0   Glucose, UA NEGATIVE NEGATIVE mg/dL   Hgb urine dipstick NEGATIVE NEGATIVE   Bilirubin Urine SMALL (A) NEGATIVE   Ketones, ur NEGATIVE NEGATIVE mg/dL   Protein, ur NEGATIVE NEGATIVE mg/dL   Urobilinogen, UA 1.0 0.0 - 1.0 mg/dL   Nitrite NEGATIVE NEGATIVE   Leukocytes, UA TRACE (A) NEGATIVE  Comprehensive metabolic panel  Result Value Ref Range   Sodium 143 137 - 147 mEq/L   Potassium 4.3 3.7 - 5.3 mEq/L   Chloride 106 96 - 112 mEq/L   CO2 26 19 - 32 mEq/L   Glucose, Bld 131 (H) 70 - 99 mg/dL   BUN 35 (H) 6 - 23 mg/dL   Creatinine, Ser 1.20 0.50 - 1.35 mg/dL   Calcium 9.2 8.4 - 10.5 mg/dL   Total Protein 6.3 6.0 - 8.3 g/dL   Albumin 3.7 3.5 - 5.2 g/dL   AST 24 0 - 37 U/L   ALT 10 0 - 53 U/L   Alkaline Phosphatase 56 39 - 117 U/L   Total Bilirubin 0.4 0.3 - 1.2 mg/dL   GFR calc non Af Amer 53 (L) >90 mL/min   GFR calc Af Amer 61 (L) >90 mL/min   Anion gap 11 5 - 15  CBC with Differential  Result Value Ref Range   WBC 3.3 (L) 4.0 - 10.5 K/uL   RBC 3.47 (L) 4.22 - 5.81 MIL/uL   Hemoglobin 12.3 (L) 13.0 - 17.0 g/dL   HCT 37.6 (L) 39.0 - 52.0 %   MCV 108.4 (H) 78.0 - 100.0 fL   MCH 35.4 (H) 26.0 - 34.0 pg   MCHC 32.7 30.0 - 36.0 g/dL   RDW 13.1 11.5 - 15.5 %   Platelets 165 150 - 400 K/uL   Neutrophils Relative % 66 43 - 77 %   Neutro Abs 2.2 1.7 - 7.7 K/uL   Lymphocytes Relative 21 12 - 46 %   Lymphs Abs 0.7 0.7 - 4.0 K/uL   Monocytes Relative 10 3 - 12 %   Monocytes Absolute 0.3 0.1 - 1.0 K/uL   Eosinophils Relative 3 0 - 5 %   Eosinophils Absolute 0.1 0.0 - 0.7 K/uL   Basophils Relative 0 0 - 1 %   Basophils Absolute 0.0  0.0 - 0.1 K/uL  Urine rapid drug screen (hosp performed)  Result Value Ref Range   Opiates NONE DETECTED NONE DETECTED   Cocaine NONE DETECTED NONE DETECTED   Benzodiazepines NONE DETECTED NONE DETECTED   Amphetamines NONE DETECTED NONE DETECTED   Tetrahydrocannabinol NONE DETECTED NONE DETECTED   Barbiturates NONE DETECTED NONE  DETECTED  Urine microscopic-add on  Result Value Ref Range   Squamous Epithelial / LPF RARE RARE   WBC, UA 0-2 <3 WBC/hpf   RBC / HPF 0-2 <3 RBC/hpf   Bacteria, UA RARE RARE   Crystals CA OXALATE CRYSTALS (A) NEGATIVE   Urine-Other MUCOUS PRESENT    Laboratory interpretation all normal     Imaging Review No results found.   EKG Interpretation None      MDM   Final diagnoses:  Aggressive behavior  Dementia, with behavioral disturbance    Disposition pending  Rolland Porter, MD, Abram Sander    Janice Norrie, MD 03/11/14 [phone]

## 2014-03-11 NOTE — ED Notes (Signed)
Pt had 5mg  Haldol given by EMS

## 2014-03-11 NOTE — ED Notes (Signed)
Bed: VG68 Expected date:  Expected time:  Means of arrival:  Comments: EMS 80yo M, combative, pysch assessment

## 2014-03-19 ENCOUNTER — Other Ambulatory Visit: Payer: Self-pay | Admitting: *Deleted

## 2014-03-19 MED ORDER — METHADONE HCL 5 MG PO TABS: ORAL_TABLET | ORAL | Status: DC

## 2014-03-19 NOTE — Telephone Encounter (Signed)
[facility] 

## 2014-03-29 ENCOUNTER — Non-Acute Institutional Stay (SKILLED_NURSING_FACILITY): Payer: Medicare Other | Admitting: Adult Health

## 2014-03-29 DIAGNOSIS — G4700 Insomnia, unspecified: Secondary | ICD-10-CM

## 2014-03-29 DIAGNOSIS — F0391 Unspecified dementia with behavioral disturbance: Secondary | ICD-10-CM

## 2014-03-29 DIAGNOSIS — K5900 Constipation, unspecified: Secondary | ICD-10-CM

## 2014-03-29 DIAGNOSIS — K219 Gastro-esophageal reflux disease without esophagitis: Secondary | ICD-10-CM

## 2014-03-29 DIAGNOSIS — E039 Hypothyroidism, unspecified: Secondary | ICD-10-CM

## 2014-03-29 DIAGNOSIS — F03918 Unspecified dementia, unspecified severity, with other behavioral disturbance: Secondary | ICD-10-CM

## 2014-03-29 DIAGNOSIS — G8929 Other chronic pain: Secondary | ICD-10-CM

## 2014-03-29 DIAGNOSIS — K5909 Other constipation: Secondary | ICD-10-CM

## 2014-03-29 DIAGNOSIS — M549 Dorsalgia, unspecified: Secondary | ICD-10-CM

## 2014-03-29 DIAGNOSIS — M47812 Spondylosis without myelopathy or radiculopathy, cervical region: Secondary | ICD-10-CM

## 2014-03-29 DIAGNOSIS — N400 Enlarged prostate without lower urinary tract symptoms: Secondary | ICD-10-CM

## 2014-04-19 ENCOUNTER — Encounter: Payer: Self-pay | Admitting: Adult Health

## 2014-04-19 DIAGNOSIS — G4700 Insomnia, unspecified: Secondary | ICD-10-CM | POA: Insufficient documentation

## 2014-04-19 MED ORDER — ESZOPICLONE 2 MG PO TABS: 2.0000 mg | ORAL_TABLET | Freq: Every day | ORAL | Status: DC

## 2014-04-19 NOTE — Progress Notes (Signed)
Patient ID: George Foster, male   DOB: Dec 25, 1924, 81 y.o.   MRN: [id number]  [facility]     No Known Allergies     Chief Complaint  Patient presents with  . Medical Management of Chronic Issues    HPI:  He is a long term resident of this facility being seen for the management of his chronic illnesses. His status remains stable. He is not complaining of pain present; stating that his pain is being managed. He is complaining of insomnia. There are no nursing concerns being voiced at this time.   Past Medical History  Diagnosis Date  . Dementia   . Hypertension   . Arthritis   . Hypothyroidism   . Chronic back pain   . Anxiety disorder   . Gastroesophageal reflux disease   . Polypharmacy     History of hospitalizations for altered mental status related to medications  . Chronic constipation     with fecal impactions, recurrent  . Suicidal ideation 06/06/2011    Past Surgical History  Procedure Laterality Date  . Appendectomy    . Removal of nonrestorable teeth numbers 2, 4, 6, 12, 13, 18, 29      VITAL SIGNS BP 132/76 mmHg  Pulse 74  Ht 5\' 10"  (1.778 m)  Wt 179 lb (81.194 kg)  BMI 25.68 kg/m2   Outpatient Encounter Prescriptions as of 03/29/2014  Medication Sig  . acetaminophen (TYLENOL) 325 MG tablet Take 650 mg by mouth every 12 (twelve) hours as needed for mild pain.   Marland Kitchen docusate sodium (COLACE) 100 MG capsule Take 100 mg by mouth 2 (two) times daily.  Marland Kitchen dutasteride (AVODART) 0.5 MG capsule Take 0.5 mg by mouth every morning.   . escitalopram (LEXAPRO) 10 MG tablet Take 10 mg by mouth at bedtime.   . eszopiclone (LUNESTA) 1 MG TABS tablet Take 1 mg by mouth at bedtime as needed for sleep. Take immediately before bedtime  . guaifenesin (ROBITUSSIN) 100 MG/5ML syrup Take 200 mg by mouth every 4 (four) hours as needed for cough.  . LamoTRIgine 50 MG TBDP Take 125 mg by mouth daily.   Marland Kitchen levothyroxine (SYNTHROID, LEVOTHROID) 88 MCG tablet Take 88 mcg by mouth at  bedtime.  . Linaclotide (LINZESS) 290 MCG CAPS capsule Take 290 mcg by mouth every morning.    lunesta 1 mg  Take 1 mg nightly   . LORazepam (ATIVAN) 0.5 MG tablet Take one tablet by mouth every 8 hours as needed for anixety  . methadone (DOLOPHINE) 5 MG tablet Take three tablets by mouth twice daily for pain  . methocarbamol (ROBAXIN) 500 MG tablet Take 500 mg by mouth 2 (two) times daily.  . Multiple Vitamins-Minerals (MULTIVITAMINS THER. W/MINERALS) TABS Take 1 tablet by mouth daily.  Marland Kitchen omeprazole (PRILOSEC) 40 MG capsule Take 40 mg by mouth daily.  Marland Kitchen oxyCODONE (OXY IR/ROXICODONE) 5 MG immediate release tablet Take 5-10 mg by mouth every 4 (four) hours as needed for severe pain. 10 mg every 8 hours as needed 5 mg every 4 hours as needed  . polyethylene glycol (MIRALAX / GLYCOLAX) packet Take 17 g by mouth daily.  . Rivastigmine 13.3 MG/24HR PT24 Place 1 patch onto the skin every morning.  . tamsulosin (FLOMAX) 0.4 MG CAPS Take 0.4 mg by mouth at bedtime.   . traMADol (ULTRAM) 50 MG tablet Take 2 tablets (100 mg total) by mouth every 6 (six) hours.     SIGNIFICANT DIAGNOSTIC EXAMS  01-30-14: ct of head  and cervical spine: 1. Small vessel ischemic disease and brain atrophy. 2. No acute intracranial abnormalities. 3. Thoracic spondylosis noted.  LABS REVIEWED:   01-03-14; wbc 5.0; hgb 13.4; hct 43.0; mcv 110.2; plt 186; glucose 94; bun 19.5; creat 1.04; k+4.4 ;na++140; liver normal albumin 4.2; tsh 2.7 03-11-14: wbc 3.3; hgb 12.3; h ct 37.6; mcv 108.4; plt 165; glucose 131; bun 35; creat 1.2; k+4.3; na++143;  Liver normal albumin 3.7          ROS Constitutional: Negative for malaise/fatigue.  Respiratory: Negative for cough.   Cardiovascular: Negative for chest pain, palpitations and leg swelling.  Gastrointestinal: negative for constipation  Negative for abdominal pain.  Musculoskeletal: Negative for myalgias, back pain, joint pain and neck pain.  Skin: Negative.     Psychiatric/Behavioral: Negative for depression. The patient is not nervous/anxious.    Physical Exam Constitutional: He is oriented to person, place, and time. He appears well-developed and well-nourished. No distress.  Neck: Neck supple. No JVD present. No thyromegaly present.  Cardiovascular: Normal rate, regular rhythm and intact distal pulses.   Respiratory: Effort normal and breath sounds normal. No respiratory distress. He has no wheezes.  GI: Soft. Bowel sounds are normal. He exhibits no distension. There is no tenderness.  Musculoskeletal: He exhibits no edema.  Is able to move all extremities Has limited range of motion in neck   Neurological: He is alert and oriented to person, place, and time.  Skin: Skin is warm and dry. He is not diaphoretic.    ASSESSMENT/ PLAN:  1. BPH: will continue proscar daily and flomax daily   2. Dementia: he is presently without change in his status; will continue exelon patch 13.3 mg daily will monitor  3. Hypothyroidism: will continue synthroid 88 mcg daily his tsh is 2.7  4. Gerd: will continue prilosec 40 mg daily   5. Constipation: will continue linzes 290 mcg dialy; colace twice daily and will change the miralax to twice daily   6. Osteoarthritis of cervical spine and Chronic back pain: he is presently stable will continue methadone 15 mg twice daily with oxycodone 10 mg every 8 hours as needed and 5 mg every 4 hours as needed; has ultram 50 mg every 6 hours as needed. Takes robaxon 500 mg three times daily will not make changes   7. Anxiety disorder: will continue lexapro 10 mg daily; depakote 250 mg nightly and lamictal 100 mg daily to stabilize mood; will conitnue ativan 0.5 mg every 8 hours as needed   8. Insomnia: he is not sleeping at night; will increase his lunesta to 2 mg nightly and will monitor    Ok Edwards NP [facility]  Contact [phone] Monday through Friday 8am- 5pm  After hours call  [phone]

## 2014-04-24 ENCOUNTER — Encounter: Payer: Self-pay | Admitting: Adult Health

## 2014-04-24 ENCOUNTER — Non-Acute Institutional Stay (SKILLED_NURSING_FACILITY): Payer: Medicare Other | Admitting: Adult Health

## 2014-04-24 DIAGNOSIS — F03918 Unspecified dementia, unspecified severity, with other behavioral disturbance: Secondary | ICD-10-CM

## 2014-04-24 DIAGNOSIS — E039 Hypothyroidism, unspecified: Secondary | ICD-10-CM

## 2014-04-24 DIAGNOSIS — N400 Enlarged prostate without lower urinary tract symptoms: Secondary | ICD-10-CM

## 2014-04-24 DIAGNOSIS — M549 Dorsalgia, unspecified: Secondary | ICD-10-CM

## 2014-04-24 DIAGNOSIS — M47812 Spondylosis without myelopathy or radiculopathy, cervical region: Secondary | ICD-10-CM

## 2014-04-24 DIAGNOSIS — K5909 Other constipation: Secondary | ICD-10-CM

## 2014-04-24 DIAGNOSIS — F411 Generalized anxiety disorder: Secondary | ICD-10-CM

## 2014-04-24 DIAGNOSIS — G4700 Insomnia, unspecified: Secondary | ICD-10-CM

## 2014-04-24 DIAGNOSIS — K5900 Constipation, unspecified: Secondary | ICD-10-CM

## 2014-04-24 DIAGNOSIS — G8929 Other chronic pain: Secondary | ICD-10-CM

## 2014-04-24 DIAGNOSIS — F0391 Unspecified dementia with behavioral disturbance: Secondary | ICD-10-CM

## 2014-04-24 NOTE — Progress Notes (Signed)
Patient ID: George Foster, male   DOB: [birth year], 81 y.o.   MRN: [id number]  [facility]     No Known Allergies     Chief Complaint  Patient presents with  . Medical Management of Chronic Issues    HPI:  He is a long term resident of this facility being seen for the management of his chronic illnesses. Overall his status is stable his pain is being managed. He is not voicing any complaints or concerns at this time. There are no nursing concerns at this time.    Past Medical History  Diagnosis Date  . Dementia   . Hypertension   . Arthritis   . Hypothyroidism   . Chronic back pain   . Anxiety disorder   . Gastroesophageal reflux disease   . Polypharmacy     History of hospitalizations for altered mental status related to medications  . Chronic constipation     with fecal impactions, recurrent  . Suicidal ideation 06/06/2011    Past Surgical History  Procedure Laterality Date  . Appendectomy    . Removal of nonrestorable teeth numbers 2, 4, 6, 12, 13, 18, 29      VITAL SIGNS BP 132/78 mmHg  Pulse 74  Ht 5\' 10"  (1.778 m)  Wt 179 lb (81.194 kg)  BMI 25.68 kg/m2   Outpatient Encounter Prescriptions as of 04/24/2014  Medication Sig  . acetaminophen (TYLENOL) 325 MG tablet Take 650 mg by mouth every 12 (twelve) hours as needed for mild pain.   Marland Kitchen docusate sodium (COLACE) 100 MG capsule Take 100 mg by mouth 2 (two) times daily.  Marland Kitchen dutasteride (AVODART) 0.5 MG capsule Take 0.5 mg by mouth every morning.   . escitalopram (LEXAPRO) 10 MG tablet Take 10 mg by mouth at bedtime.   . eszopiclone (LUNESTA) 2 MG TABS tablet Take 1 tablet (2 mg total) by mouth at bedtime. Take immediately before bedtime  . guaifenesin (ROBITUSSIN) 100 MG/5ML syrup Take 200 mg by mouth every 4 (four) hours as needed for cough.  . LamoTRIgine 50 MG TBDP Take 125 mg by mouth daily.   Marland Kitchen levothyroxine (SYNTHROID, LEVOTHROID) 88 MCG tablet Take 88 mcg by mouth at bedtime.  . Linaclotide (LINZESS) 290  MCG CAPS capsule Take 290 mcg by mouth every morning.   Marland Kitchen LORazepam (ATIVAN) 0.5 MG tablet Take one tablet by mouth every 8 hours as needed for anixety  . methadone (DOLOPHINE) 5 MG tablet Take three tablets by mouth twice daily for pain  . methocarbamol (ROBAXIN) 500 MG tablet Take 500 mg by mouth 2 (two) times daily.  . Multiple Vitamins-Minerals (MULTIVITAMINS THER. W/MINERALS) TABS Take 1 tablet by mouth daily.  Marland Kitchen omeprazole (PRILOSEC) 40 MG capsule Take 40 mg by mouth daily.  Marland Kitchen oxyCODONE (OXY IR/ROXICODONE) 5 MG immediate release tablet Take 5-10 mg by mouth every 4 (four) hours as needed for severe pain. 10 mg every 8 hours as needed 5 mg every 4 hours as needed  . polyethylene glycol (MIRALAX / GLYCOLAX) packet Take 17 g by mouth daily.  . Rivastigmine 13.3 MG/24HR PT24 Place 1 patch onto the skin every morning.  . tamsulosin (FLOMAX) 0.4 MG CAPS Take 0.4 mg by mouth at bedtime.   . traMADol (ULTRAM) 50 MG tablet Take 2 tablets (100 mg total) by mouth every 6 (six) hours.  . [DISCONTINUED] cephALEXin (KEFLEX) 500 MG capsule Take 1 capsule (500 mg total) by mouth 4 (four) times daily. (Patient not taking: Reported on 04/24/2014)  SIGNIFICANT DIAGNOSTIC EXAMS   01-30-14: ct of head and cervical spine: 1. Small vessel ischemic disease and brain atrophy. 2. No acute intracranial abnormalities. 3. Thoracic spondylosis noted.  LABS REVIEWED:   01-03-14; wbc 5.0; hgb 13.4; hct 43.0; mcv 110.2; plt 186; glucose 94; bun 19.5; creat 1.04; k+4.4 ;na++140; liver normal albumin 4.2; tsh 2.7 03-11-14: wbc 3.3; hgb 12.3; hct 37.6; mcv 108.4; plt 165; glucose 131; bun 35; creat 1.2; k+4.3; na++143;  Liver normal albumin 3.7      ROS Constitutional: Negative for malaise/fatigue.  Respiratory: Negative for cough.   Cardiovascular: Negative for chest pain, palpitations and leg swelling.  Gastrointestinal: negative for constipation  Negative for abdominal pain.  Musculoskeletal: Negative for  myalgias, back pain, joint pain and neck pain.  Skin: Negative.   Psychiatric/Behavioral: Negative for depression. The patient is not nervous/anxious.   Physical Exam  Constitutional: He is oriented to person, place, and time. He appears well-developed and well-nourished. No distress.  Neck: Neck supple. No JVD present. No thyromegaly present.  Cardiovascular: Normal rate, regular rhythm and intact distal pulses.   Respiratory: Effort normal and breath sounds normal. No respiratory distress. He has no wheezes.  GI: Soft. Bowel sounds are normal. He exhibits no distension. There is no tenderness.  Musculoskeletal: He exhibits no edema.  Is able to move all extremities Has limited range of motion in neck   Neurological: He is alert and oriented to person, place, and time.  Skin: Skin is warm and dry. He is not diaphoretic.   ASSESSMENT/ PLAN:  1. BPH: will continue proscar daily and flomax daily   2. Dementia: he is presently without change in his status; will continue exelon patch 13.3 mg daily will monitor  3. Hypothyroidism: will continue synthroid 88 mcg daily his tsh is 2.7  4. Gerd: will continue prilosec 40 mg daily   5. Constipation: will continue linzes 290 mcg daily; colace twice daily and miralax  twice daily   6. Osteoarthritis of cervical spine and Chronic back pain: he is presently stable will continue methadone 15 mg twice daily with oxycodone 10 mg every 8 hours routinely and 5 mg every 4 hours as needed; has ultram 100 mg every 6 hours  Takes robaxon 500 mg twice daily will not make changes   7. Anxiety disorder: will continue lexapro 10 mg daily; depakote 250 mg nightly and lamictal 100 mg daily to stabilize mood; will conitnue ativan 0.5 mg every 8 hours as needed   8. Insomnia: he is not sleeping at night; will increase his lunesta to 2 mg nightly and will monitor      Ok Edwards NP [facility]  Contact [phone] Monday through Friday 8am-  5pm  After hours call [phone]

## 2014-05-01 ENCOUNTER — Other Ambulatory Visit: Payer: Self-pay | Admitting: *Deleted

## 2014-05-01 MED ORDER — TRAMADOL HCL 50 MG PO TABS: ORAL_TABLET | ORAL | Status: DC

## 2014-05-01 NOTE — Telephone Encounter (Signed)
[facility] 

## 2014-05-07 ENCOUNTER — Encounter: Payer: Medicare Other | Attending: Physical Medicine & Rehabilitation

## 2014-05-07 ENCOUNTER — Encounter: Payer: Self-pay | Admitting: Physical Medicine & Rehabilitation

## 2014-05-07 ENCOUNTER — Ambulatory Visit (HOSPITAL_BASED_OUTPATIENT_CLINIC_OR_DEPARTMENT_OTHER): Payer: Medicare Other | Admitting: Physical Medicine & Rehabilitation

## 2014-05-07 VITALS — BP 132/60 | HR 86 | Resp 14

## 2014-05-07 DIAGNOSIS — Z87820 Personal history of traumatic brain injury: Secondary | ICD-10-CM | POA: Diagnosis not present

## 2014-05-07 DIAGNOSIS — M47812 Spondylosis without myelopathy or radiculopathy, cervical region: Secondary | ICD-10-CM | POA: Insufficient documentation

## 2014-05-07 DIAGNOSIS — R2689 Other abnormalities of gait and mobility: Secondary | ICD-10-CM | POA: Insufficient documentation

## 2014-05-07 DIAGNOSIS — F0391 Unspecified dementia with behavioral disturbance: Secondary | ICD-10-CM | POA: Insufficient documentation

## 2014-05-07 DIAGNOSIS — F03918 Unspecified dementia, unspecified severity, with other behavioral disturbance: Secondary | ICD-10-CM

## 2014-05-07 MED ORDER — METHADONE HCL 5 MG PO TABS: ORAL_TABLET | ORAL | Status: DC

## 2014-05-07 MED ORDER — METHADONE HCL 10 MG PO TABS: 10.0000 mg | ORAL_TABLET | Freq: Two times a day (BID) | ORAL | Status: DC

## 2014-05-07 MED ORDER — OXYCODONE HCL 10 MG PO TABS: 10.0000 mg | ORAL_TABLET | Freq: Four times a day (QID) | ORAL | Status: DC | PRN

## 2014-05-07 NOTE — Progress Notes (Addendum)
George Foster from [facility] ( ph # [phone]) called and said they need new rx for the changes in the oxycodone and methadone.  Oxycodone 10 mg q 6 hours prn #120 and Methadone 10 mg q 12 hours #60 printed for George Foster's nursing home [facility] which was requiring new rx's for the medication changes Dr Letta Pate made. Archivist  at [facility].

## 2014-05-07 NOTE — Patient Instructions (Signed)
May do trigger point injections next month  Plan is to slowly convert methadone to oxycodone
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
## 2014-05-07 NOTE — Addendum Note (Signed)
Addended by: Caro Hight on: 05/07/2014 01:17 PM   Modules accepted: Orders, Medications

## 2014-05-09 ENCOUNTER — Other Ambulatory Visit: Payer: Self-pay | Admitting: *Deleted

## 2014-05-09 MED ORDER — OXYCODONE HCL 5 MG PO TABS: ORAL_TABLET | ORAL | Status: DC

## 2014-05-09 NOTE — Telephone Encounter (Signed)
[facility] 

## 2014-05-13 ENCOUNTER — Other Ambulatory Visit: Payer: Self-pay | Admitting: *Deleted

## 2014-05-13 MED ORDER — OXYCODONE HCL 5 MG PO TABS: ORAL_TABLET | ORAL | Status: DC

## 2014-05-13 NOTE — Telephone Encounter (Signed)
[facility] 

## 2014-05-28 ENCOUNTER — Ambulatory Visit: Payer: Self-pay | Admitting: Physical Medicine & Rehabilitation

## 2014-05-29 ENCOUNTER — Non-Acute Institutional Stay (SKILLED_NURSING_FACILITY): Payer: Medicare Other | Admitting: Adult Health

## 2014-05-29 DIAGNOSIS — M47812 Spondylosis without myelopathy or radiculopathy, cervical region: Secondary | ICD-10-CM | POA: Diagnosis not present

## 2014-05-29 DIAGNOSIS — N400 Enlarged prostate without lower urinary tract symptoms: Secondary | ICD-10-CM | POA: Diagnosis not present

## 2014-05-29 DIAGNOSIS — G4700 Insomnia, unspecified: Secondary | ICD-10-CM

## 2014-05-29 DIAGNOSIS — K219 Gastro-esophageal reflux disease without esophagitis: Secondary | ICD-10-CM | POA: Diagnosis not present

## 2014-05-29 DIAGNOSIS — K5900 Constipation, unspecified: Secondary | ICD-10-CM | POA: Diagnosis not present

## 2014-05-29 DIAGNOSIS — F03918 Unspecified dementia, unspecified severity, with other behavioral disturbance: Secondary | ICD-10-CM

## 2014-05-29 DIAGNOSIS — G8929 Other chronic pain: Secondary | ICD-10-CM | POA: Diagnosis not present

## 2014-05-29 DIAGNOSIS — E039 Hypothyroidism, unspecified: Secondary | ICD-10-CM

## 2014-05-29 DIAGNOSIS — M549 Dorsalgia, unspecified: Secondary | ICD-10-CM

## 2014-05-29 DIAGNOSIS — F411 Generalized anxiety disorder: Secondary | ICD-10-CM

## 2014-05-29 DIAGNOSIS — F0391 Unspecified dementia with behavioral disturbance: Secondary | ICD-10-CM

## 2014-05-29 DIAGNOSIS — K5909 Other constipation: Secondary | ICD-10-CM

## 2014-06-11 ENCOUNTER — Other Ambulatory Visit: Payer: Self-pay | Admitting: *Deleted

## 2014-06-11 MED ORDER — METHADONE HCL 5 MG PO TABS: ORAL_TABLET | ORAL | Status: DC

## 2014-06-11 NOTE — Telephone Encounter (Signed)
[facility] 

## 2014-07-01 ENCOUNTER — Non-Acute Institutional Stay (SKILLED_NURSING_FACILITY): Payer: Medicare Other | Admitting: Internal Medicine

## 2014-07-01 ENCOUNTER — Encounter: Payer: Self-pay | Admitting: Internal Medicine

## 2014-07-01 DIAGNOSIS — I10 Essential (primary) hypertension: Secondary | ICD-10-CM

## 2014-07-01 DIAGNOSIS — G609 Hereditary and idiopathic neuropathy, unspecified: Secondary | ICD-10-CM | POA: Diagnosis not present

## 2014-07-01 DIAGNOSIS — F03918 Unspecified dementia, unspecified severity, with other behavioral disturbance: Secondary | ICD-10-CM

## 2014-07-01 DIAGNOSIS — F0391 Unspecified dementia with behavioral disturbance: Secondary | ICD-10-CM | POA: Diagnosis not present

## 2014-07-01 DIAGNOSIS — G894 Chronic pain syndrome: Secondary | ICD-10-CM | POA: Diagnosis not present

## 2014-07-01 DIAGNOSIS — G4700 Insomnia, unspecified: Secondary | ICD-10-CM

## 2014-07-01 DIAGNOSIS — M6248 Contracture of muscle, other site: Secondary | ICD-10-CM

## 2014-07-01 DIAGNOSIS — E038 Other specified hypothyroidism: Secondary | ICD-10-CM | POA: Diagnosis not present

## 2014-07-01 DIAGNOSIS — E034 Atrophy of thyroid (acquired): Secondary | ICD-10-CM | POA: Diagnosis not present

## 2014-07-01 DIAGNOSIS — F411 Generalized anxiety disorder: Secondary | ICD-10-CM

## 2014-07-01 DIAGNOSIS — M62838 Other muscle spasm: Secondary | ICD-10-CM

## 2014-07-01 NOTE — Progress Notes (Signed)
Patient ID: George Foster, male   DOB: 13-Feb-1925, 81 y.o.   MRN: [id number]    [place] of Service: SNF (31)   CODE STATUS:  No Known Allergies  Chief Complaint  Patient presents with  . Medical Management of Chronic Issues    HPI:  81 yo male long term resident seen today for f/u. He c/o uncontrolled neuropathic pain and his feet feel cold constantly. He does not recall taking gabapentin or lyrica. He has seen pain mx in the past but that was several yrs ago. The pain interrupts sleep. He also has chronic back and neck pain and spasms. He takes methadone, oxycodone, tramadol  He has an exelon patch for dementia. He takes lamictal.  His BP is controlled without meds.  Thyroid controlled on levothyroxine.  He has chronic constipation and takes linzess, colace and miralax.  Enlarged prostate w sx's and takes avodart and flomax.  He has anxiety d/o and takes lorazepam and lexapro. lunesta helps him sleep  Past Medical History  Diagnosis Date  . Dementia   . Hypertension   . Arthritis   . Hypothyroidism   . Chronic back pain   . Anxiety disorder   . Gastroesophageal reflux disease   . Polypharmacy     History of hospitalizations for altered mental status related to medications  . Chronic constipation     with fecal impactions, recurrent  . Suicidal ideation 06/06/2011   Past Surgical History  Procedure Laterality Date  . Appendectomy    . Removal of nonrestorable teeth numbers 2, 4, 6, 12, 13, 18, 29     History   Social History  . Marital Status: Married    Spouse Name: Donia Guiles  . Number of Children: 5  . Years of Education: 12   Occupational History  . 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 Smoking status: Never Smoker   . Smokeless tobacco: Never Used  . Alcohol Use: No     Comment: Pt denies  . Drug Use: No     Comment: Pt denies  . Sexual Activity: Not on file   Other Topics Concern  .  None   Social History Narrative   Married.  Lives in [place] with wife.  Ambulates with a walker.     Medications: Patient's Medications  New Prescriptions   No medications on file  Previous Medications   ACETAMINOPHEN (TYLENOL) 325 MG TABLET    Take 650 mg by mouth every 12 (twelve) hours as needed for mild pain.    DOCUSATE SODIUM (COLACE) 100 MG CAPSULE    Take 100 mg by mouth 2 (two) times daily.   DUTASTERIDE (AVODART) 0.5 MG CAPSULE    Take 0.5 mg by mouth every morning.    ESCITALOPRAM (LEXAPRO) 10 MG TABLET    Take 10 mg by mouth at bedtime.    ESZOPICLONE (LUNESTA) 2 MG TABS TABLET    Take 1 tablet (2 mg total) by mouth at bedtime. Take immediately before bedtime   GUAIFENESIN (ROBITUSSIN) 100 MG/5ML SYRUP    Take 200 mg by mouth every 4 (four) hours as needed for cough.   LAMOTRIGINE 50 MG TBDP    Take 125 mg by mouth daily.    LEVOTHYROXINE (SYNTHROID, LEVOTHROID) 88 MCG TABLET    Take 88 mcg by mouth at bedtime.   LINACLOTIDE (LINZESS) 290 MCG CAPS CAPSULE    Take 290 mcg by  mouth every morning.    LORAZEPAM (ATIVAN) 0.5 MG TABLET    Take one tablet by mouth every 8 hours as needed for anixety   METHADONE (DOLOPHINE) 10 MG TABLET    Take 1 tablet (10 mg total) by mouth every 12 (twelve) hours.   METHADONE (DOLOPHINE) 5 MG TABLET    Take one tablet by mouth every 12 hours along with $RemoveBe'10mg'lBgshCiAu$  tablets for total dose of $Remov'15mg'VwHWjw$  for pain   METHOCARBAMOL (ROBAXIN) 500 MG TABLET    Take 500 mg by mouth 2 (two) times daily.   MULTIPLE VITAMINS-MINERALS (MULTIVITAMINS THER. W/MINERALS) TABS    Take 1 tablet by mouth daily.   OMEPRAZOLE (PRILOSEC) 40 MG CAPSULE    Take 40 mg by mouth daily.   OXYCODONE (ROXICODONE) 5 MG IMMEDIATE RELEASE TABLET    Take two tablets by mouth every 8 hours for pain; Take one tablet by mouth every 4 hours as needed for pain   OXYCODONE HCL 10 MG TABS    Take 1 tablet (10 mg total) by mouth every 6 (six) hours as needed.   POLYETHYLENE GLYCOL (MIRALAX /  GLYCOLAX) PACKET    Take 17 g by mouth daily.   RIVASTIGMINE 13.3 MG/24HR PT24    Place 1 patch onto the skin every morning.   TAMSULOSIN (FLOMAX) 0.4 MG CAPS    Take 0.4 mg by mouth at bedtime.    TRAMADOL (ULTRAM) 50 MG TABLET    Take two tablets by mouth every 6 hours for pain  Modified Medications   No medications on file  Discontinued Medications   No medications on file     Review of Systems  Unable to perform ROS: Dementia    Filed Vitals:   07/01/14 1743  BP: 132/67  Pulse: 73  Temp: 98 F (36.7 C)   There is no weight on file to calculate BMI.  Physical Exam  Constitutional: He appears well-developed and well-nourished.  Awake and alert in NAD. Sitting in chair at bedside  HENT:  Mouth/Throat: Oropharynx is clear and moist.  Eyes: Pupils are equal, round, and reactive to light. No scleral icterus.  Neck: Neck supple. No thyromegaly present.  Cardiovascular: Normal rate, regular rhythm, normal heart sounds and intact distal pulses.  Exam reveals no gallop and no friction rub.   No murmur heard. No carotid bruit b/l; no distal LE swelling  Pulmonary/Chest: Effort normal and breath sounds normal. He has no wheezes. He has no rales. He exhibits no tenderness.  Abdominal: Soft. Bowel sounds are normal. He exhibits no distension, no abdominal bruit, no pulsatile midline mass and no mass. There is no tenderness. There is no rebound and no guarding.  Lymphadenopathy:    He has no cervical adenopathy.  Neurological: He is alert.  Skin: Skin is warm and dry. No rash noted.  Psychiatric: He has a normal mood and affect. His behavior is normal.     Labs reviewed: No visits with results within 3 Month(s) from this visit. Latest known visit with results is:  Admission on 03/11/2014, Discharged on 03/11/2014  Component Date Value Ref Range Status  . Color, Urine 03/11/2014 YELLOW  YELLOW Final  . APPearance 03/11/2014 CLEAR  CLEAR Final  . Specific Gravity, Urine  03/11/2014 1.027  1.005 - 1.030 Final  . pH 03/11/2014 5.5  5.0 - 8.0 Final  . Glucose, UA 03/11/2014 NEGATIVE  NEGATIVE mg/dL Final  . Hgb urine dipstick 03/11/2014 NEGATIVE  NEGATIVE Final  . Bilirubin Urine 03/11/2014 SMALL*  NEGATIVE Final  . Ketones, ur 03/11/2014 NEGATIVE  NEGATIVE mg/dL Final  . Protein, ur 03/11/2014 NEGATIVE  NEGATIVE mg/dL Final  . Urobilinogen, UA 03/11/2014 1.0  0.0 - 1.0 mg/dL Final  . Nitrite 03/11/2014 NEGATIVE  NEGATIVE Final  . Leukocytes, UA 03/11/2014 TRACE* NEGATIVE Final  . Sodium 03/11/2014 143  137 - 147 mEq/L Final  . Potassium 03/11/2014 4.3  3.7 - 5.3 mEq/L Final  . Chloride 03/11/2014 106  96 - 112 mEq/L Final  . CO2 03/11/2014 26  19 - 32 mEq/L Final  . Glucose, Bld 03/11/2014 131* 70 - 99 mg/dL Final  . BUN 03/11/2014 35* 6 - 23 mg/dL Final  . Creatinine, Ser 03/11/2014 1.20  0.50 - 1.35 mg/dL Final  . Calcium 03/11/2014 9.2  8.4 - 10.5 mg/dL Final  . Total Protein 03/11/2014 6.3  6.0 - 8.3 g/dL Final  . Albumin 03/11/2014 3.7  3.5 - 5.2 g/dL Final  . AST 03/11/2014 24  0 - 37 U/L Final  . ALT 03/11/2014 10  0 - 53 U/L Final  . Alkaline Phosphatase 03/11/2014 56  39 - 117 U/L Final  . Total Bilirubin 03/11/2014 0.4  0.3 - 1.2 mg/dL Final  . GFR calc non Af Amer 03/11/2014 53* >90 mL/min Final  . GFR calc Af Amer 03/11/2014 61* >90 mL/min Final   Comment: (NOTE) The eGFR has been calculated using the CKD EPI equation. This calculation has not been validated in all clinical situations. eGFR's persistently <90 mL/min signify possible Chronic Kidney Disease.   . Anion gap 03/11/2014 11  5 - 15 Final  . WBC 03/11/2014 3.3* 4.0 - 10.5 K/uL Final  . RBC 03/11/2014 3.47* 4.22 - 5.81 MIL/uL Final  . Hemoglobin 03/11/2014 12.3* 13.0 - 17.0 g/dL Final  . HCT 03/11/2014 37.6* 39.0 - 52.0 % Final  . MCV 03/11/2014 108.4* 78.0 - 100.0 fL Final  . MCH 03/11/2014 35.4* 26.0 - 34.0 pg Final  . MCHC 03/11/2014 32.7  30.0 - 36.0 g/dL Final  . RDW  03/11/2014 13.1  11.5 - 15.5 % Final  . Platelets 03/11/2014 165  150 - 400 K/uL Final  . Neutrophils Relative % 03/11/2014 66  43 - 77 % Final  . Neutro Abs 03/11/2014 2.2  1.7 - 7.7 K/uL Final  . Lymphocytes Relative 03/11/2014 21  12 - 46 % Final  . Lymphs Abs 03/11/2014 0.7  0.7 - 4.0 K/uL Final  . Monocytes Relative 03/11/2014 10  3 - 12 % Final  . Monocytes Absolute 03/11/2014 0.3  0.1 - 1.0 K/uL Final  . Eosinophils Relative 03/11/2014 3  0 - 5 % Final  . Eosinophils Absolute 03/11/2014 0.1  0.0 - 0.7 K/uL Final  . Basophils Relative 03/11/2014 0  0 - 1 % Final  . Basophils Absolute 03/11/2014 0.0  0.0 - 0.1 K/uL Final  . Valproic Acid Lvl 03/11/2014 <10.0* 50.0 - 100.0 ug/mL Final   Performed at [facility]  . Opiates 03/11/2014 NONE DETECTED  NONE DETECTED Final  . Cocaine 03/11/2014 NONE DETECTED  NONE DETECTED Final  . Benzodiazepines 03/11/2014 NONE DETECTED  NONE DETECTED Final  . Amphetamines 03/11/2014 NONE DETECTED  NONE DETECTED Final  . Tetrahydrocannabinol 03/11/2014 NONE DETECTED  NONE DETECTED Final  . Barbiturates 03/11/2014 NONE DETECTED  NONE DETECTED Final   Comment:        DRUG SCREEN FOR MEDICAL PURPOSES ONLY.  IF CONFIRMATION IS NEEDED FOR ANY PURPOSE, NOTIFY LAB WITHIN 5 DAYS.  LOWEST DETECTABLE LIMITS FOR URINE DRUG SCREEN Drug Class       Cutoff (ng/mL) Amphetamine      1000 Barbiturate      200 Benzodiazepine   701 Tricyclics       100 Opiates          300 Cocaine          300 THC              50   . Squamous Epithelial / LPF 03/11/2014 RARE  RARE Final  . WBC, UA 03/11/2014 0-2  <3 WBC/hpf Final  . RBC / HPF 03/11/2014 0-2  <3 RBC/hpf Final  . Bacteria, UA 03/11/2014 RARE  RARE Final  . Crystals 03/11/2014 CA OXALATE CRYSTALS* NEGATIVE Final  . Edwina Barth 03/11/2014 MUCOUS PRESENT   Final     Assessment/Plan   ICD-9-CM ICD-10-CM   1. Hereditary and idiopathic peripheral neuropathy - uncontrolled 356.9 G60.9   2. Chronic  pain syndrome- uncontrolled 338.4 G89.4   3. Dementia with behavioral disturbance - cont exelon patch 294.21 F03.91   4. Essential hypertension - controlled 401.9 I10   5. Hypothyroidism due to acquired atrophy of thyroid - stable; cont levothyroxine 244.8 E03.8    246.8 E03.4   6. Muscle spasms of neck - cont robaxin 728.85 M62.48   7. Generalized anxiety disorder - cont lorazepam and lexapro 300.02 F41.1   8. Insomnia - cont lunesta 780.52 G47.00     --no pain med changes made today as he is taking several meds for pain control already- tramadol, oxycodone w breakthrough oxycodone, methadone and robaxin. May need pain mx eval for further tx options. May consider gabapentin or lyrica in the future.  --continue all other meds as ordered. He takes linzess and miralax for constipation and avodart and flomax for enlarged prostate sx's.  --will follow  Tosha Belgarde S. Perlie Gold  [facility] [address] [place], [place] [address] [phone] Office (Wednesdays and Fridays 8 AM - 5 PM) [phone] Cell (Monday-Friday 8 AM - 5 PM)

## 2014-07-05 NOTE — Progress Notes (Signed)
Patient ID: George Foster, male   DOB: 1925-03-07, 81 y.o.   MRN: [id number]  [facility]     No Known Allergies     Chief Complaint  Patient presents with  . Medical Management of Chronic Issues    HPI:  He is a long term resident of this facility being seen for the management of his chronic illnesses. Overall his status is stable. He is not complaining of any pain at this time. There are no nursing concerns at this time.    Past Medical History  Diagnosis Date  . Dementia   . Hypertension   . Arthritis   . Hypothyroidism   . Chronic back pain   . Anxiety disorder   . Gastroesophageal reflux disease   . Polypharmacy     History of hospitalizations for altered mental status related to medications  . Chronic constipation     with fecal impactions, recurrent  . Suicidal ideation 06/06/2011    Past Surgical History  Procedure Laterality Date  . Appendectomy    . Removal of nonrestorable teeth numbers 2, 4, 6, 12, 13, 18, 29      VITAL SIGNS BP 128/75 mmHg  Pulse 70  Ht 5\' 7"  (1.702 m)  Wt 175 lb (79.379 kg)  BMI 27.40 kg/m2   Outpatient Encounter Prescriptions as of 05/29/2014  Medication Sig  . acetaminophen (TYLENOL) 325 MG tablet Take 650 mg by mouth every 12 (twelve) hours as needed for mild pain.   Marland Kitchen docusate sodium (COLACE) 100 MG capsule Take 100 mg by mouth 2 (two) times daily.  Marland Kitchen dutasteride (AVODART) 0.5 MG capsule Take 0.5 mg by mouth every morning.   . escitalopram (LEXAPRO) 10 MG tablet Take 10 mg by mouth at bedtime.   . eszopiclone (LUNESTA) 2 MG TABS tablet Take 1 tablet (2 mg total) by mouth at bedtime. Take immediately before bedtime  . guaifenesin (ROBITUSSIN) 100 MG/5ML syrup Take 200 mg by mouth every 4 (four) hours as needed for cough.  . LamoTRIgine 50 MG TBDP Take 125 mg by mouth daily.   Marland Kitchen levothyroxine (SYNTHROID, LEVOTHROID) 88 MCG tablet Take 88 mcg by mouth at bedtime.  . Linaclotide (LINZESS) 290 MCG CAPS capsule Take 290 mcg by mouth  every morning.   Marland Kitchen LORazepam (ATIVAN) 0.5 MG tablet Take one tablet by mouth every 8 hours as needed for anixety  . methadone (DOLOPHINE) 10 MG tablet Take 1 tablet (10 mg total) by mouth every 12 (twelve) hours.  . methocarbamol (ROBAXIN) 500 MG tablet Take 500 mg by mouth 2 (two) times daily.  . Multiple Vitamins-Minerals (MULTIVITAMINS THER. W/MINERALS) TABS Take 1 tablet by mouth daily.  Marland Kitchen omeprazole (PRILOSEC) 40 MG capsule Take 40 mg by mouth daily.  Marland Kitchen oxyCODONE (ROXICODONE) 5 MG immediate release tablet Take two tablets by mouth every 8 hours for pain; Take one tablet by mouth every 4 hours as needed for pain  . Oxycodone HCl 10 MG TABS Take 1 tablet (10 mg total) by mouth every 6 (six) hours as needed.  . polyethylene glycol (MIRALAX / GLYCOLAX) packet Take 17 g by mouth daily.  . Rivastigmine 13.3 MG/24HR PT24 Place 1 patch onto the skin every morning.  . tamsulosin (FLOMAX) 0.4 MG CAPS Take 0.4 mg by mouth at bedtime.   . traMADol (ULTRAM) 50 MG tablet Take two tablets by mouth every 6 hours for pain     SIGNIFICANT DIAGNOSTIC EXAMS   01-30-14: ct of head and cervical spine: 1. Small  vessel ischemic disease and brain atrophy. 2. No acute intracranial abnormalities. 3. Thoracic spondylosis noted.  LABS REVIEWED:   01-03-14; wbc 5.0; hgb 13.4; hct 43.0; mcv 110.2; plt 186; glucose 94; bun 19.5; creat 1.04; k+4.4 ;na++140; liver normal albumin 4.2; tsh 2.7 03-11-14: wbc 3.3; hgb 12.3; hct 37.6; mcv 108.4; plt 165; glucose 131; bun 35; creat 1.2; k+4.3; na++143;  Liver normal albumin 3.7     ROS Constitutional: Negative for malaise/fatigue.  Respiratory: Negative for cough.   Cardiovascular: Negative for chest pain, palpitations and leg swelling.  Gastrointestinal: negative for constipation  Negative for abdominal pain.  Musculoskeletal: Negative for myalgias, back pain, joint pain and neck pain.  Skin: Negative.   Psychiatric/Behavioral: Negative for depression. The patient  is not nervous/anxious.    Physical Exam Constitutional: He is oriented to person, place, and time. He appears well-developed and well-nourished. No distress.  Neck: Neck supple. No JVD present. No thyromegaly present.  Cardiovascular: Normal rate, regular rhythm and intact distal pulses.   Respiratory: Effort normal and breath sounds normal. No respiratory distress. He has no wheezes.  GI: Soft. Bowel sounds are normal. He exhibits no distension. There is no tenderness.  Musculoskeletal: He exhibits no edema.  Is able to move all extremities Has limited range of motion in neck   Neurological: He is alert and oriented to person, place, and time.  Skin: Skin is warm and dry. He is not diaphoretic.     ASSESSMENT/ PLAN:   1. BPH: will continue proscar daily and flomax daily   2. Dementia: he is presently without change in his status; will continue exelon patch 13.3 mg daily will monitor  3. Hypothyroidism: will continue synthroid 88 mcg daily his tsh is 2.7  4. Gerd: will continue prilosec 40 mg daily   5. Constipation: will continue linzes 290 mcg daily; colace twice daily and miralax  twice daily   6. Osteoarthritis of cervical spine and Chronic back pain: he is presently stable will continue methadone 15 mg twice daily with oxycodone 10 mg every 8 hours routinely and 5 mg every 4 hours as needed; has ultram 100 mg every 6 hours  Takes robaxon 500 mg twice daily will not make changes   7. Anxiety disorder: will continue lexapro 10 mg daily; depakote 250 mg nightly and lamictal 100 mg daily to stabilize mood; will conitnue ativan 0.5 mg every 8 hours as needed   8. Insomnia: he is stable ; will continue lunesta  2 mg nightly and will monitor      Ok Edwards NP [facility]  Contact [phone] Monday through Friday 8am- 5pm  After hours call [phone]

## 2014-07-15 ENCOUNTER — Other Ambulatory Visit: Payer: Self-pay | Admitting: *Deleted

## 2014-07-15 MED ORDER — OXYCODONE HCL 5 MG PO TABS: ORAL_TABLET | ORAL | Status: DC

## 2014-07-15 MED ORDER — METHADONE HCL 5 MG PO TABS: ORAL_TABLET | ORAL | Status: DC

## 2014-07-15 NOTE — Telephone Encounter (Signed)
[facility] 

## 2014-07-16 ENCOUNTER — Other Ambulatory Visit: Payer: Self-pay | Admitting: *Deleted

## 2014-07-16 MED ORDER — OXYCODONE HCL 5 MG PO TABS: ORAL_TABLET | ORAL | Status: DC

## 2014-07-16 NOTE — Telephone Encounter (Signed)
[facility] 

## 2014-07-18 ENCOUNTER — Other Ambulatory Visit: Payer: Self-pay | Admitting: *Deleted

## 2014-07-18 MED ORDER — OXYCODONE HCL 5 MG PO TABS: ORAL_TABLET | ORAL | Status: DC

## 2014-07-18 NOTE — Telephone Encounter (Signed)
[facility] 

## 2014-07-19 ENCOUNTER — Encounter: Payer: Self-pay | Admitting: *Deleted

## 2014-07-22 ENCOUNTER — Other Ambulatory Visit: Payer: Self-pay | Admitting: *Deleted

## 2014-07-22 MED ORDER — OXYCODONE HCL 5 MG PO TABS: ORAL_TABLET | ORAL | Status: DC

## 2014-07-22 NOTE — Telephone Encounter (Signed)
[facility] 

## 2014-07-24 ENCOUNTER — Non-Acute Institutional Stay (SKILLED_NURSING_FACILITY): Payer: Medicare Other | Admitting: Adult Health

## 2014-07-24 DIAGNOSIS — G4700 Insomnia, unspecified: Secondary | ICD-10-CM | POA: Diagnosis not present

## 2014-07-24 DIAGNOSIS — M549 Dorsalgia, unspecified: Secondary | ICD-10-CM | POA: Diagnosis not present

## 2014-07-24 DIAGNOSIS — M47812 Spondylosis without myelopathy or radiculopathy, cervical region: Secondary | ICD-10-CM

## 2014-07-24 DIAGNOSIS — F0391 Unspecified dementia with behavioral disturbance: Secondary | ICD-10-CM

## 2014-07-24 DIAGNOSIS — K5900 Constipation, unspecified: Secondary | ICD-10-CM | POA: Diagnosis not present

## 2014-07-24 DIAGNOSIS — F03918 Unspecified dementia, unspecified severity, with other behavioral disturbance: Secondary | ICD-10-CM

## 2014-07-24 DIAGNOSIS — E039 Hypothyroidism, unspecified: Secondary | ICD-10-CM | POA: Diagnosis not present

## 2014-07-24 DIAGNOSIS — K219 Gastro-esophageal reflux disease without esophagitis: Secondary | ICD-10-CM

## 2014-07-24 DIAGNOSIS — N400 Enlarged prostate without lower urinary tract symptoms: Secondary | ICD-10-CM

## 2014-07-24 DIAGNOSIS — F411 Generalized anxiety disorder: Secondary | ICD-10-CM | POA: Diagnosis not present

## 2014-07-24 DIAGNOSIS — G8929 Other chronic pain: Secondary | ICD-10-CM

## 2014-07-24 DIAGNOSIS — K5909 Other constipation: Secondary | ICD-10-CM

## 2014-08-20 ENCOUNTER — Other Ambulatory Visit: Payer: Self-pay | Admitting: *Deleted

## 2014-08-20 MED ORDER — METHADONE HCL 5 MG PO TABS: ORAL_TABLET | ORAL | Status: DC

## 2014-08-20 MED ORDER — OXYCODONE HCL 5 MG PO TABS: ORAL_TABLET | ORAL | Status: DC

## 2014-08-20 NOTE — Telephone Encounter (Signed)
[facility] 

## 2014-08-21 ENCOUNTER — Non-Acute Institutional Stay (SKILLED_NURSING_FACILITY): Payer: Medicare Other | Admitting: Adult Health

## 2014-08-21 ENCOUNTER — Other Ambulatory Visit: Payer: Self-pay | Admitting: *Deleted

## 2014-08-21 DIAGNOSIS — E039 Hypothyroidism, unspecified: Secondary | ICD-10-CM

## 2014-08-21 DIAGNOSIS — K5909 Other constipation: Secondary | ICD-10-CM

## 2014-08-21 DIAGNOSIS — G4700 Insomnia, unspecified: Secondary | ICD-10-CM | POA: Diagnosis not present

## 2014-08-21 DIAGNOSIS — K219 Gastro-esophageal reflux disease without esophagitis: Secondary | ICD-10-CM | POA: Diagnosis not present

## 2014-08-21 DIAGNOSIS — M549 Dorsalgia, unspecified: Secondary | ICD-10-CM | POA: Diagnosis not present

## 2014-08-21 DIAGNOSIS — G8929 Other chronic pain: Secondary | ICD-10-CM | POA: Diagnosis not present

## 2014-08-21 DIAGNOSIS — N400 Enlarged prostate without lower urinary tract symptoms: Secondary | ICD-10-CM | POA: Diagnosis not present

## 2014-08-21 DIAGNOSIS — F411 Generalized anxiety disorder: Secondary | ICD-10-CM | POA: Diagnosis not present

## 2014-08-21 DIAGNOSIS — M47812 Spondylosis without myelopathy or radiculopathy, cervical region: Secondary | ICD-10-CM | POA: Diagnosis not present

## 2014-08-21 DIAGNOSIS — K5900 Constipation, unspecified: Secondary | ICD-10-CM | POA: Diagnosis not present

## 2014-08-21 MED ORDER — OXYCODONE HCL 5 MG PO TABS: ORAL_TABLET | ORAL | Status: DC

## 2014-08-21 NOTE — Telephone Encounter (Signed)
[facility]

## 2014-08-22 ENCOUNTER — Other Ambulatory Visit: Payer: Self-pay | Admitting: *Deleted

## 2014-08-22 MED ORDER — OXYCODONE HCL 5 MG PO TABS: ORAL_TABLET | ORAL | Status: DC

## 2014-08-22 NOTE — Telephone Encounter (Signed)
[facility] 

## 2014-08-27 ENCOUNTER — Encounter: Payer: Self-pay | Admitting: Adult Health

## 2014-08-27 NOTE — Progress Notes (Signed)
Patient ID: George Foster, male   DOB: 02/20/1925, 81 y.o.   MRN: [id number]  [facility]     No Known Allergies     Chief Complaint  Patient presents with  . Medical Management of Chronic Issues    HPI:  He is a long term resident of this facility being seen for the management of his chronic illnesses. Overall his status is without significant change. He continues to complain of back and neck pain; however; he does not want to make changes at this time. There are no nursing concerns at this time.    Past Medical History  Diagnosis Date  . Dementia   . Hypertension   . Arthritis   . Hypothyroidism   . Chronic back pain   . Anxiety disorder   . Gastroesophageal reflux disease   . Polypharmacy     History of hospitalizations for altered mental status related to medications  . Chronic constipation     with fecal impactions, recurrent  . Suicidal ideation 06/06/2011    Past Surgical History  Procedure Laterality Date  . Appendectomy    . Removal of nonrestorable teeth numbers 2, 4, 6, 12, 13, 18, 29      VITAL SIGNS BP 134/86 mmHg  Pulse 72  Ht 5\' 7"  (1.702 m)  Wt 175 lb (79.379 kg)  BMI 27.40 kg/m2  SpO2 98%   Outpatient Encounter Prescriptions as of 07/24/2014  Medication Sig  . docusate sodium (COLACE) 100 MG capsule Take 100 mg by mouth 2 (two) times daily.  Marland Kitchen dutasteride (AVODART) 0.5 MG capsule Take 0.5 mg by mouth every morning.   . escitalopram (LEXAPRO) 10 MG tablet Take 10 mg by mouth at bedtime.   . eszopiclone (LUNESTA) 2 MG TABS tablet Take 1 tablet (2 mg total) by mouth at bedtime. Take immediately before bedtime  . LamoTRIgine 50 MG TBDP Take 125 mg by mouth daily.   Marland Kitchen levothyroxine (SYNTHROID, LEVOTHROID) 88 MCG tablet Take 88 mcg by mouth at bedtime.  . lidocaine (LIDODERM) 5 % Place 1 patch onto the skin daily. Remove & Discard patch within 12 hours or as directed by MD Apply to cervical spine  . Linaclotide (LINZESS) 290 MCG CAPS capsule Take  290 mcg by mouth every morning.   Marland Kitchen LORazepam (ATIVAN) 0.5 MG tablet Take one tablet by mouth every 8 hours as needed for anixety  . methadone (DOLOPHINE) 10 MG tablet  Take 15 mg by mouth every 12 (twelve) hours.   . methocarbamol (ROBAXIN) 500 MG tablet Take 500 mg by mouth 2 (two) times daily.  . Multiple Vitamins-Minerals (MULTIVITAMINS THER. W/MINERALS) TABS Take 1 tablet by mouth daily.  Marland Kitchen omeprazole (PRILOSEC) 40 MG capsule Take 40 mg by mouth daily.  Marland Kitchen oxyCODONE (ROXICODONE) 5 MG immediate release tablet  Take 5 mg by mouth every 4 (four) hours as needed.   . Oxycodone HCl 10 MG TABS Take 10 mg by mouth every 8 (eight) hours.   . polyethylene glycol (MIRALAX / GLYCOLAX) packet Take 17 g by mouth daily.  . Rivastigmine 13.3 MG/24HR PT24 Place 1 patch onto the skin every morning.  . tamsulosin (FLOMAX) 0.4 MG CAPS Take 0.4 mg by mouth at bedtime.   . traMADol (ULTRAM) 50 MG tablet Take two tablets by mouth every 6 hours for pain      SIGNIFICANT DIAGNOSTIC EXAMS  01-30-14: ct of head and cervical spine: 1. Small vessel ischemic disease and brain atrophy. 2. No acute intracranial abnormalities. 3.  Thoracic spondylosis noted.    LABS REVIEWED:   01-03-14; wbc 5.0; hgb 13.4; hct 43.0; mcv 110.2; plt 186; glucose 94; bun 19.5; creat 1.04; k+4.4 ;na++140; liver normal albumin 4.2; tsh 2.7 03-11-14: wbc 3.3; hgb 12.3; hct 37.6; mcv 108.4; plt 165; glucose 131; bun 35; creat 1.2; k+4.3; na++143;  Liver normal albumin 3.7    ROS Constitutional: Negative for malaise/fatigue.  Respiratory: Negative for cough.   Cardiovascular: Negative for chest pain, palpitations and leg swelling.  Gastrointestinal: negative for constipation  Negative for abdominal pain.  Musculoskeletal: has back and neck pain  Skin: Negative.   Psychiatric/Behavioral: Negative for depression. The patient is not nervous/anxious.     Physical Exam Constitutional: He is oriented to person, place, and time. He  appears well-developed and well-nourished. No distress.  Neck: Neck supple. No JVD present. No thyromegaly present.  Cardiovascular: Normal rate, regular rhythm and intact distal pulses.   Respiratory: Effort normal and breath sounds normal. No respiratory distress. He has no wheezes.  GI: Soft. Bowel sounds are normal. He exhibits no distension. There is no tenderness.  Musculoskeletal: He exhibits no edema.  Is able to move all extremities Has limited range of motion in neck   Neurological: He is alert and oriented to person, place, and time.  Skin: Skin is warm and dry. He is not diaphoretic.      ASSESSMENT/ PLAN:  1. BPH: will continue avodart daily and flomax daily   2. Dementia: he is presently without change in his status; will continue exelon patch 13.3 mg daily will monitor  3. Hypothyroidism: will continue synthroid 88 mcg daily his tsh is 2.7  4. Gerd: will continue prilosec 40 mg daily   5. Constipation: will continue linzes 290 mcg daily; colace twice daily and miralax  daily   6. Osteoarthritis of cervical spine and Chronic back pain: he is presently stable will continue methadone 15 mg twice daily with oxycodone 10 mg every 8 hours routinely and 5 mg every 4 hours as needed; has ultram 100 mg every 6 hours  Takes robaxon 500 mg twice daily and lidoderm patch to cervical spine will not make changes   7. Anxiety disorder: will continue lexapro 10 mg daily;  lamictal 125 mg daily to stabilize mood; will conitnue ativan 0.5 mg every 8 hours as needed   8. Insomnia: he is stable ; will continue lunesta  2 mg nightly and will monitor        Ok Edwards NP [facility]  Contact [phone] Monday through Friday 8am- 5pm  After hours call [phone]

## 2014-08-28 NOTE — Progress Notes (Signed)
Patient ID: George Foster, male   DOB: [birth year], 81 y.o.   MRN: [id number]  [facility]     No Known Allergies     Chief Complaint  Patient presents with  . Medical Management of Chronic Issues    HPI:  He is a long term resident of this facility being seen for the management of his chronic illnesses. Overall his status is without significant change. He did have a fall recently and is complaining of left side and increased left back pain. He denies any changes in urination. We discussed his pain in great detail. This does represent muscle based pain due to his fall and will require medication adjustments.    Past Medical History  Diagnosis Date  . Dementia   . Hypertension   . Arthritis   . Hypothyroidism   . Chronic back pain   . Anxiety disorder   . Gastroesophageal reflux disease   . Polypharmacy     History of hospitalizations for altered mental status related to medications  . Chronic constipation     with fecal impactions, recurrent  . Suicidal ideation 06/06/2011    Past Surgical History  Procedure Laterality Date  . Appendectomy    . Removal of nonrestorable teeth numbers 2, 4, 6, 12, 13, 18, 29      VITAL SIGNS BP 126/76 mmHg  Pulse 84  Ht 5\' 7"  (1.702 m)  Wt 175 lb (79.379 kg)  BMI 27.40 kg/m2  SpO2 98%   Outpatient Encounter Prescriptions as of 08/21/2014  Medication Sig  . docusate sodium (COLACE) 100 MG capsule Take 100 mg by mouth 2 (two) times daily.  Marland Kitchen dutasteride (AVODART) 0.5 MG capsule Take 0.5 mg by mouth every morning.   . escitalopram (LEXAPRO) 10 MG tablet Take 10 mg by mouth at bedtime.   . eszopiclone (LUNESTA) 2 MG TABS tablet Take 1 tablet (2 mg total) by mouth at bedtime. Take immediately before bedtime  . LamoTRIgine 50 MG TBDP Take 125 mg by mouth daily.   Marland Kitchen levothyroxine (SYNTHROID, LEVOTHROID) 88 MCG tablet Take 88 mcg by mouth at bedtime.  . lidocaine (LIDODERM) 5 % Place 1 patch onto the skin daily. Remove & Discard patch within  12 hours or as directed by MD Apply to cervical spine  . Linaclotide (LINZESS) 290 MCG CAPS capsule Take 290 mcg by mouth every morning.   Marland Kitchen LORazepam (ATIVAN) 0.5 MG tablet Take one tablet by mouth every 8 hours as needed for anixety  . methadone (DOLOPHINE) 10 MG tablet Take 15 mg by mouth every 12 (twelve) hours. )  . methocarbamol (ROBAXIN) 500 MG tablet Take 500 mg by mouth 2 (two) times daily.  . Multiple Vitamins-Minerals (MULTIVITAMINS THER. W/MINERALS) TABS Take 1 tablet by mouth daily.  Marland Kitchen omeprazole (PRILOSEC) 40 MG capsule Take 40 mg by mouth daily.  . Oxycodone HCl 10 MG TABS Take 10 mg by mouth every 8 (eight) hours. )  . polyethylene glycol (MIRALAX / GLYCOLAX) packet Take 17 g by mouth daily.  . Rivastigmine 13.3 MG/24HR PT24 Place 1 patch onto the skin every morning.  . tamsulosin (FLOMAX) 0.4 MG CAPS Take 0.4 mg by mouth at bedtime.   . traMADol (ULTRAM) 50 MG tablet Take two tablets by mouth every 6 hours for pain      SIGNIFICANT DIAGNOSTIC EXAMS   01-30-14: ct of head and cervical spine: 1. Small vessel ischemic disease and brain atrophy. 2. No acute intracranial abnormalities. 3. Thoracic spondylosis noted.  LABS REVIEWED:   01-03-14; wbc 5.0; hgb 13.4; hct 43.0; mcv 110.2; plt 186; glucose 94; bun 19.5; creat 1.04; k+4.4 ;na++140; liver normal albumin 4.2; tsh 2.7 03-11-14: wbc 3.3; hgb 12.3; hct 37.6; mcv 108.4; plt 165; glucose 131; bun 35; creat 1.2; k+4.3; na++143;  Liver normal albumin 3.7      ROS Constitutional: Negative for malaise/fatigue.  Respiratory: Negative for cough.   Cardiovascular: Negative for chest pain, palpitations and leg swelling.  Gastrointestinal: negative for constipation  Negative for abdominal pain.  Musculoskeletal: has back and neck pain and has left side pain  Skin: Negative.   Psychiatric/Behavioral: Negative for depression. The patient is not nervous/anxious.      Physical Exam Constitutional: He is oriented to  person, place, and time. He appears well-developed and well-nourished. No distress.  Neck: Neck supple. No JVD present. No thyromegaly present.  Cardiovascular: Normal rate, regular rhythm and intact distal pulses.   Respiratory: Effort normal and breath sounds normal. No respiratory distress. He has no wheezes.  GI: Soft. Bowel sounds are normal. He exhibits no distension. There is no tenderness.  Musculoskeletal: He exhibits no edema.  Is able to move all extremities Has limited range of motion in neck Has tenderness to palpation to left side without bruising or swelling present.    Neurological: He is alert and oriented to person, place, and time.  Skin: Skin is warm and dry. He is not diaphoretic.       ASSESSMENT/ PLAN:   1. BPH: will continue avodart daily and flomax daily   2. Dementia: he is presently without change in his status; will continue exelon patch 13.3 mg daily will monitor  3. Hypothyroidism: will continue synthroid 88 mcg daily his tsh is 2.7  4. Gerd: will continue prilosec 40 mg daily   5. Constipation: will continue linzes 290 mcg daily; colace twice daily and miralax  daily   6. Osteoarthritis of cervical spine and Chronic back pain:  will continue methadone 15 mg twice daily with oxycodone 10 mg every 8 hours routinely and 5 mg every 4 hours as needed; has ultram 100 mg every 6 hours and  lidoderm patch to cervical spine will add lidoderm patch to his left side for his pain and will increase his robaxin to 500 mg every 8 hours; will have therapy evaluate and treat as indicated.   7. Anxiety disorder: will continue lexapro 10 mg daily;  lamictal 125 mg daily to stabilize mood; will conitnue ativan 0.5 mg every 8 hours as needed   8. Insomnia: he is stable ; will continue lunesta  2 mg nightly and will monitor   Will check cbc; cmp tsh    Ok Edwards NP [name]  Contact [phone] Monday through Friday 8am- 5pm  After hours call  [phone]

## 2014-09-04 ENCOUNTER — Non-Acute Institutional Stay (SKILLED_NURSING_FACILITY): Payer: Medicare Other | Admitting: Adult Health

## 2014-09-04 DIAGNOSIS — K5900 Constipation, unspecified: Secondary | ICD-10-CM | POA: Diagnosis not present

## 2014-09-04 DIAGNOSIS — K5909 Other constipation: Secondary | ICD-10-CM

## 2014-09-16 ENCOUNTER — Other Ambulatory Visit: Payer: Self-pay | Admitting: *Deleted

## 2014-09-16 MED ORDER — METHADONE HCL 5 MG PO TABS: ORAL_TABLET | ORAL | Status: DC

## 2014-09-16 NOTE — Telephone Encounter (Signed)
[facility] 

## 2014-09-24 ENCOUNTER — Other Ambulatory Visit: Payer: Self-pay

## 2014-09-24 DIAGNOSIS — G4700 Insomnia, unspecified: Secondary | ICD-10-CM

## 2014-09-24 MED ORDER — ESZOPICLONE 2 MG PO TABS: 2.0000 mg | ORAL_TABLET | Freq: Every day | ORAL | Status: DC

## 2014-09-24 MED ORDER — TRAMADOL HCL 50 MG PO TABS: ORAL_TABLET | ORAL | Status: DC

## 2014-09-24 NOTE — Telephone Encounter (Signed)
RX faxed to [facility] @ [phone], phone number [phone] 

## 2014-09-24 NOTE — Addendum Note (Signed)
Addended by: Logan Bores on: 09/24/2014 12:05 PM   Modules accepted: Orders

## 2014-09-26 ENCOUNTER — Non-Acute Institutional Stay (SKILLED_NURSING_FACILITY): Payer: Medicare Other | Admitting: Adult Health

## 2014-09-26 ENCOUNTER — Encounter: Payer: Self-pay | Admitting: Adult Health

## 2014-09-26 DIAGNOSIS — M47812 Spondylosis without myelopathy or radiculopathy, cervical region: Secondary | ICD-10-CM | POA: Diagnosis not present

## 2014-09-26 DIAGNOSIS — E039 Hypothyroidism, unspecified: Secondary | ICD-10-CM | POA: Diagnosis not present

## 2014-09-26 DIAGNOSIS — F03918 Unspecified dementia, unspecified severity, with other behavioral disturbance: Secondary | ICD-10-CM

## 2014-09-26 DIAGNOSIS — G4700 Insomnia, unspecified: Secondary | ICD-10-CM

## 2014-09-26 DIAGNOSIS — N400 Enlarged prostate without lower urinary tract symptoms: Secondary | ICD-10-CM | POA: Diagnosis not present

## 2014-09-26 DIAGNOSIS — G8929 Other chronic pain: Secondary | ICD-10-CM | POA: Diagnosis not present

## 2014-09-26 DIAGNOSIS — F0391 Unspecified dementia with behavioral disturbance: Secondary | ICD-10-CM

## 2014-09-26 DIAGNOSIS — K5900 Constipation, unspecified: Secondary | ICD-10-CM | POA: Diagnosis not present

## 2014-09-26 DIAGNOSIS — K219 Gastro-esophageal reflux disease without esophagitis: Secondary | ICD-10-CM

## 2014-09-26 DIAGNOSIS — F411 Generalized anxiety disorder: Secondary | ICD-10-CM | POA: Diagnosis not present

## 2014-09-26 DIAGNOSIS — K5909 Other constipation: Secondary | ICD-10-CM

## 2014-09-26 DIAGNOSIS — M549 Dorsalgia, unspecified: Secondary | ICD-10-CM

## 2014-09-26 MED ORDER — SENNOSIDES-DOCUSATE SODIUM 8.6-50 MG PO TABS: 2.0000 | ORAL_TABLET | Freq: Every day | ORAL | Status: DC

## 2014-09-26 NOTE — Progress Notes (Signed)
Patient ID: George Foster, male   DOB: 04-30-1924, 81 y.o.   MRN: [id number]  [facility]     No Known Allergies     Chief Complaint  Patient presents with  . Acute Visit    patient concerns     HPI:  He is complaining of constipation which is causing him abdominal pain and is making his back pain worse. He does not want lab work done. He tells me that if his constipation were relieved he will feel better. He denies any nausea or vomiting.    Past Medical History  Diagnosis Date  . Dementia   . Hypertension   . Arthritis   . Hypothyroidism   . Chronic back pain   . Anxiety disorder   . Gastroesophageal reflux disease   . Polypharmacy     History of hospitalizations for altered mental status related to medications  . Chronic constipation     with fecal impactions, recurrent  . Suicidal ideation 06/06/2011    Past Surgical History  Procedure Laterality Date  . Appendectomy    . Removal of nonrestorable teeth numbers 2, 4, 6, 12, 13, 18, 29      VITAL SIGNS BP 119/79 mmHg  Pulse 69  Ht 5\' 7"  (1.702 m)  Wt 175 lb (79.379 kg)  BMI 27.40 kg/m2   Outpatient Encounter Prescriptions as of 09/04/2014  Medication Sig  . docusate sodium (COLACE) 100 MG capsule Take 100 mg by mouth 2 (two) times daily.  Marland Kitchen dutasteride (AVODART) 0.5 MG capsule Take 0.5 mg by mouth every morning.   . escitalopram (LEXAPRO) 10 MG tablet Take 10 mg by mouth at bedtime.   . LamoTRIgine 50 MG TBDP Take 125 mg by mouth daily.   Marland Kitchen levothyroxine (SYNTHROID, LEVOTHROID) 88 MCG tablet Take 88 mcg by mouth at bedtime.  . lidocaine (LIDODERM) 5 % Place 1 patch onto the skin daily. Remove & Discard patch within 12 hours or as directed by MD Apply to cervical spine  . Linaclotide (LINZESS) 290 MCG CAPS capsule Take 290 mcg by mouth every morning.   Marland Kitchen LORazepam (ATIVAN) 0.5 MG tablet Take one tablet by mouth every 8 hours as needed for anixety  . methadone (DOLOPHINE) 10 MG tablet  Take 15 mg by mouth  every 12 (twelve) hours. )  . methocarbamol (ROBAXIN) 500 MG tablet Take 500 mg by mouth 2 (two) times daily.  . Multiple Vitamins-Minerals (MULTIVITAMINS THER. W/MINERALS) TABS Take 1 tablet by mouth daily.  Marland Kitchen omeprazole (PRILOSEC) 40 MG capsule Take 40 mg by mouth daily.  Marland Kitchen oxyCODONE (ROXICODONE) 5 MG immediate release tablet Take two tablets by mouth every 8 hours as needed for pain (Patient taking differently: Take 5 mg by mouth every 4 (four) hours as needed. )  . Oxycodone HCl 10 MG TABS Take 10 mg by mouth every 8 (eight) hours. )  . polyethylene glycol (MIRALAX / GLYCOLAX) packet Take 17 g by mouth daily.  . Rivastigmine 13.3 MG/24HR PT24 Place 1 patch onto the skin every morning.  . tamsulosin (FLOMAX) 0.4 MG CAPS Take 0.4 mg by mouth at bedtime.    No facility-administered encounter medications on file as of 09/04/2014.     SIGNIFICANT DIAGNOSTIC EXAMS   01-30-14: ct of head and cervical spine: 1. Small vessel ischemic disease and brain atrophy. 2. No acute intracranial abnormalities. 3. Thoracic spondylosis noted.    LABS REVIEWED:   01-03-14; wbc 5.0; hgb 13.4; hct 43.0; mcv 110.2; plt 186; glucose 94;  bun 19.5; creat 1.04; k+4.4 ;na++140; liver normal albumin 4.2; tsh 2.7 03-11-14: wbc 3.3; hgb 12.3; hct 37.6; mcv 108.4; plt 165; glucose 131; bun 35; creat 1.2; k+4.3; na++143;  Liver normal albumin 3.7       ROS Constitutional: Negative for malaise/fatigue.  Respiratory: Negative for cough.   Cardiovascular: Negative for chest pain, palpitations and leg swelling.  Gastrointestinal: has constipation and abdominal pain  Musculoskeletal: has back and neck pain  Skin: Negative.   Psychiatric/Behavioral: Negative for depression. The patient is not nervous/anxious.    Physical Exam Constitutional: He is oriented to person, place, and time. He appears well-developed and well-nourished. No distress.  Neck: Neck supple. No JVD present. No thyromegaly present.    Cardiovascular: Normal rate, regular rhythm and intact distal pulses.   Respiratory: Effort normal and breath sounds normal. No respiratory distress. He has no wheezes.  GI: Soft. Bowel sounds are normal. He exhibits no distension. There is no tenderness.  Musculoskeletal: He exhibits no edema.  Is able to move all extremities Has limited range of motion in neck Has tenderness to palpation to lower back  Neurological: He is alert and oriented to person, place, and time.  Skin: Skin is warm and dry. He is not diaphoretic.        ASSESSMENT/ PLAN:  1. Constipation: will continue linzes 290 mcg daily;miralax  daily will stop the colace ad will begin senna s 2 tabs nightly and will monitor    Ok Edwards NP [facility]  Contact [phone] Monday through Friday 8am- 5pm  After hours call [phone]

## 2014-09-26 NOTE — Progress Notes (Signed)
Patient ID: George Foster, male   DOB: [birth year], 81 y.o.   MRN: [id number]  [facility]     No Known Allergies     Chief Complaint  Patient presents with  . Annual Exam    HPI:  He is a long term resident of this facility being seen for his annual exam. He has not had any recent hospitalizations. Overall his status remains without significant change. He continues to have issues with chronic back pain and neck pain. He has declined to go to the pain clinic in the past.   Past Medical History  Diagnosis Date  . Dementia   . Hypertension   . Arthritis   . Hypothyroidism   . Chronic back pain   . Anxiety disorder   . Gastroesophageal reflux disease   . Polypharmacy     History of hospitalizations for altered mental status related to medications  . Chronic constipation     with fecal impactions, recurrent  . Suicidal ideation 06/06/2011    Past Surgical History  Procedure Laterality Date  . Appendectomy    . Removal of nonrestorable teeth numbers 2, 4, 6, 12, 13, 18, 29     Family History  Problem Relation Age of Onset  . Heart disease Father     History   Social History  . Marital Status: Married    Spouse Name: Donia Guiles  . Number of Children: 5  . Years of Education: 12   Occupational History  . Retired Librarian, academic in a [employer]  . Smoking status: Never Smoker   . Smokeless tobacco: Never Used  . Alcohol Use: No     Comment: Pt denies  . Drug Use: No     Comment: Pt denies  . Sexual Activity: Not on file   Other Topics Concern  . Not on file   Social History Narrative   Resident of snf: [facility]       VITAL SIGNS BP 128/70 mmHg  Pulse 68  Ht 5\' 7"  (1.702 m)  Wt 176 lb (79.833 kg)  BMI 27.56 kg/m2  SpO2 97%   Outpatient Encounter Prescriptions as of 09/26/2014  Medication Sig  . docusate sodium (COLACE) 100 MG capsule Take 100 mg by mouth 2 (two) times daily.  Marland Kitchen dutasteride (AVODART) 0.5 MG capsule Take 0.5 mg by  mouth every morning.   . escitalopram (LEXAPRO) 10 MG tablet Take 10 mg by mouth at bedtime.   . eszopiclone (LUNESTA) 2 MG TABS tablet Take 1 tablet (2 mg total) by mouth at bedtime. Take immediately before bedtime  . LamoTRIgine 50 MG TBDP Take 125 mg by mouth daily.   Marland Kitchen levothyroxine (SYNTHROID, LEVOTHROID) 88 MCG tablet Take 88 mcg by mouth at bedtime.  . lidocaine (LIDODERM) 5 % Place 2 patches onto the skin daily. Remove & Discard patch within 12 hours or as directed by MD Apply to cervical spine AND left lower back  . Linaclotide (LINZESS) 290 MCG CAPS capsule Take 290 mcg by mouth every morning.   Marland Kitchen LORazepam (ATIVAN) 0.5 MG tablet Take one tablet by mouth every 8 hours as needed for anixety  . methadone (DOLOPHINE) 10 MG tablet Patient taking differently: Take 15 mg by mouth every 12 (twelve) hours. )  . methadone (DOLOPHINE) 5 MG tablet Take three tablets by mouth twice daily for pain  . methocarbamol (ROBAXIN) 500 MG tablet Take 500 mg by mouth every 8 (eight) hours.   . Multiple Vitamins-Minerals (  MULTIVITAMINS THER. W/MINERALS) TABS Take 1 tablet by mouth daily.  Marland Kitchen omeprazole (PRILOSEC) 40 MG capsule Take 40 mg by mouth daily.  Marland Kitchen oxyCODONE (ROXICODONE) 5 MG immediate release tablet  Take 5 mg by mouth every 4 (four) hours as needed. )  . Oxycodone HCl 10 MG TABS  Take 10 mg by mouth every 8 (eight) hours. )  . polyethylene glycol (MIRALAX / GLYCOLAX) packet Take 17 g by mouth daily.  . Rivastigmine 13.3 MG/24HR PT24 Place 1 patch onto the skin every morning.  . senna-docusate (SENOKOT-S) 8.6-50 MG per tablet Take 2 tablets by mouth at bedtime.  . tamsulosin (FLOMAX) 0.4 MG CAPS Take 0.4 mg by mouth at bedtime.   . traMADol (ULTRAM) 50 MG tablet Take two tablets by mouth every 6 hours for pain MAX 300 MG TRAMADOL DAILY      SIGNIFICANT DIAGNOSTIC EXAMS  01-30-14: ct of head and cervical spine: 1. Small vessel ischemic disease and brain atrophy. 2. No acute intracranial  abnormalities. 3. Thoracic spondylosis noted.   LABS REVIEWED:   01-03-14; wbc 5.0; hgb 13.4; hct 43.0; mcv 110.2; plt 186; glucose 94; bun 19.5; creat 1.04; k+4.4 ;na++140; liver normal albumin 4.2; tsh 2.7 03-11-14: wbc 3.3; hgb 12.3; hct 37.6; mcv 108.4; plt 165; glucose 131; bun 35; creat 1.2; k+4.3; na++143;  Liver normal albumin 3.7  08-29-14: wbc 6.6 hgb 11.9; hct 38.3; mcv 104.2; plt 192; glucose 119; bun 23.1; creat 1.40; k+4.7; na++140; liver normal albumin 3.8 tsh 4.96      Review of Systems  Constitutional: Negative for weight loss and malaise/fatigue.  HENT:       Has from left ear large amounts of clear drainage present and feels like he hear sloshing present.   Eyes: Negative for blurred vision.  Respiratory: Negative for cough and shortness of breath.   Cardiovascular: Negative for chest pain, palpitations and leg swelling.  Gastrointestinal: Negative for heartburn, abdominal pain and constipation.  Genitourinary: Negative for dysuria.  Musculoskeletal: Positive for myalgias and back pain.       Has chronic back and neck pain; is currently managed   Skin: Negative.   Neurological: Positive for headaches. Negative for dizziness.  Psychiatric/Behavioral: Negative for depression. The patient is not nervous/anxious.      Physical Exam  Constitutional: He is oriented to person, place, and time. He appears well-developed and well-nourished. No distress.  Eyes: Pupils are equal, round, and reactive to light.  Neck: Neck supple. No JVD present. No thyromegaly present.  Cardiovascular: Normal rate, regular rhythm and intact distal pulses.   Respiratory: Effort normal and breath sounds normal. No respiratory distress.  GI: Soft. Bowel sounds are normal. He exhibits no distension. There is no tenderness.  Musculoskeletal: He exhibits no edema.  Is able to move all extremities Has limited range of motion in neck Has mild tenderness to palpation to lower back   Neurological:  He is alert and oriented to person, place, and time.  Skin: Skin is warm and dry. He is not diaphoretic.  Psychiatric: He has a normal mood and affect.       ASSESSMENT/ PLAN:  1. Chronic back pain: with cervical osteoarthritis: his pain is presently being managed on his current regimen of: methadone 15 mg twice daily; lidoderm patch to cervical spine and left lower back; oxycodone 10 mg every 8 hours and 5 mg every 4 hours as needed;ultram 100 mg every 6 hours as needed  and robaxin 500 mg every 8 hours. Will  continue to monitor his status.   2. BPH without obstruction: will continue avodart 0.5 mg daily and flomax 0.4 mg daily   3. Hypothyroidism: will continue synthroid 88 mcg daily his tsh is 4.96  4. GERD; will continue prilosec 40 mg daily   5. Chronic constipation: related to narcotic use: will continue colace twice daily; linzess 290 mcg daily; miralax 17 gm daily; and senna s 2 tabs nightly   6. Dementia: is without change in status; will continue exelon patch 13.3 mg daily   7. Anxiety disorder: will continue lexapro 10 mg daily lamcital 125 mg daily to help stabilize mood; and ativan 0.5 mg every 8 hours as needed; will monitor   8. Hypertension: is stable is presently not on medications; will not make changes and will monitor his status.   9. Insomnia: will continue lunesta 2 mg nightly   his health maintenance is up to date;   Time spent with patient 40 minutes >50% spent with patient reviewing health care issues.         Ok Edwards NP [facility]  Contact [phone] Monday through Friday 8am- 5pm  After hours call [phone]

## 2014-10-01 ENCOUNTER — Other Ambulatory Visit: Payer: Self-pay | Admitting: *Deleted

## 2014-10-01 MED ORDER — LORAZEPAM 0.5 MG PO TABS: ORAL_TABLET | ORAL | Status: DC

## 2014-10-01 NOTE — Telephone Encounter (Signed)
[place] [facility]

## 2014-10-16 ENCOUNTER — Other Ambulatory Visit: Payer: Self-pay

## 2014-10-16 MED ORDER — OXYCODONE HCL 5 MG PO TABS: ORAL_TABLET | ORAL | Status: DC

## 2014-10-16 NOTE — Telephone Encounter (Signed)
RX faxed to [facility] @ [phone], phone number [phone] 

## 2014-10-24 ENCOUNTER — Encounter (HOSPITAL_COMMUNITY): Payer: Self-pay

## 2014-10-24 ENCOUNTER — Emergency Department (HOSPITAL_COMMUNITY)
Admission: EM | Admit: 2014-10-24 | Discharge: 2014-10-25 | Disposition: A | Discharge status: Skilled Nursing Facility | Payer: Medicare Other | Attending: Emergency Medicine | Admitting: Emergency Medicine

## 2014-10-24 ENCOUNTER — Emergency Department (HOSPITAL_COMMUNITY): Payer: Medicare Other

## 2014-10-24 DIAGNOSIS — Z87828 Personal history of other (healed) physical injury and trauma: Secondary | ICD-10-CM | POA: Diagnosis not present

## 2014-10-24 DIAGNOSIS — F03918 Unspecified dementia, unspecified severity, with other behavioral disturbance: Secondary | ICD-10-CM | POA: Diagnosis present

## 2014-10-24 DIAGNOSIS — K219 Gastro-esophageal reflux disease without esophagitis: Secondary | ICD-10-CM | POA: Insufficient documentation

## 2014-10-24 DIAGNOSIS — G8929 Other chronic pain: Secondary | ICD-10-CM | POA: Insufficient documentation

## 2014-10-24 DIAGNOSIS — Z79899 Other long term (current) drug therapy: Secondary | ICD-10-CM | POA: Diagnosis not present

## 2014-10-24 DIAGNOSIS — M1990 Unspecified osteoarthritis, unspecified site: Secondary | ICD-10-CM | POA: Insufficient documentation

## 2014-10-24 DIAGNOSIS — F0391 Unspecified dementia with behavioral disturbance: Secondary | ICD-10-CM | POA: Diagnosis not present

## 2014-10-24 DIAGNOSIS — Z01818 Encounter for other preprocedural examination: Secondary | ICD-10-CM

## 2014-10-24 DIAGNOSIS — E039 Hypothyroidism, unspecified: Secondary | ICD-10-CM | POA: Diagnosis not present

## 2014-10-24 DIAGNOSIS — I10 Essential (primary) hypertension: Secondary | ICD-10-CM | POA: Diagnosis not present

## 2014-10-24 DIAGNOSIS — F419 Anxiety disorder, unspecified: Secondary | ICD-10-CM | POA: Insufficient documentation

## 2014-10-24 DIAGNOSIS — M545 Low back pain: Secondary | ICD-10-CM | POA: Diagnosis present

## 2014-10-24 LAB — BASIC METABOLIC PANEL
ANION GAP: 5 (ref 5–15)
BUN: 25 mg/dL — AB (ref 6–20)
CO2: 25 mmol/L (ref 22–32)
CREATININE: 1.18 mg/dL (ref 0.61–1.24)
Calcium: 8.6 mg/dL — ABNORMAL LOW (ref 8.9–10.3)
Chloride: 107 mmol/L (ref 101–111)
GFR calc Af Amer: >60 mL/min (ref >60)
GFR, EST NON AFRICAN AMERICAN: 53 mL/min — AB (ref >60)
GLUCOSE: 137 mg/dL — AB (ref 65–99)
POTASSIUM: 3.7 mmol/L (ref 3.5–5.1)
Sodium: 137 mmol/L (ref 135–145)

## 2014-10-24 LAB — URINALYSIS, ROUTINE W REFLEX MICROSCOPIC
Bilirubin Urine: NEGATIVE
GLUCOSE, UA: NEGATIVE mg/dL
Hgb urine dipstick: NEGATIVE
Ketones, ur: NEGATIVE mg/dL
LEUKOCYTES UA: NEGATIVE
Nitrite: NEGATIVE
Protein, ur: NEGATIVE mg/dL
SPECIFIC GRAVITY, URINE: 1.02 (ref 1.005–1.030)
UROBILINOGEN UA: 0.2 mg/dL (ref 0.0–1.0)
pH: 5.5 (ref 5.0–8.0)

## 2014-10-24 LAB — CBC WITH DIFFERENTIAL/PLATELET
BASOS PCT: 0 % (ref 0–1)
Basophils Absolute: 0 10*3/uL (ref 0.0–0.1)
EOS PCT: 2 % (ref 0–5)
Eosinophils Absolute: 0.1 10*3/uL (ref 0.0–0.7)
HCT: 35.9 % — ABNORMAL LOW (ref 39.0–52.0)
Hemoglobin: 11.9 g/dL — ABNORMAL LOW (ref 13.0–17.0)
LYMPHS ABS: 1 10*3/uL (ref 0.7–4.0)
Lymphocytes Relative: 26 % (ref 12–46)
MCH: 33.4 pg (ref 26.0–34.0)
MCHC: 33.1 g/dL (ref 30.0–36.0)
MCV: 100.8 fL — ABNORMAL HIGH (ref 78.0–100.0)
MONOS PCT: 6 % (ref 3–12)
Monocytes Absolute: 0.2 10*3/uL (ref 0.1–1.0)
NEUTROS PCT: 66 % (ref 43–77)
Neutro Abs: 2.5 10*3/uL (ref 1.7–7.7)
Platelets: 159 10*3/uL (ref 150–400)
RBC: 3.56 MIL/uL — AB (ref 4.22–5.81)
RDW: 13 % (ref 11.5–15.5)
WBC: 3.8 10*3/uL — AB (ref 4.0–10.5)

## 2014-10-24 MED ORDER — LINACLOTIDE 290 MCG PO CAPS
290.0000 ug | ORAL_CAPSULE | Freq: Every day | ORAL | Status: DC
  Administered 2014-10-25: 290 ug via ORAL
  Filled 2014-10-24: qty 1

## 2014-10-24 MED ORDER — RIVASTIGMINE 13.3 MG/24HR TD PT24: 1.0000 | MEDICATED_PATCH | Freq: Every morning | TRANSDERMAL | Status: DC

## 2014-10-24 MED ORDER — DUTASTERIDE 0.5 MG PO CAPS
0.5000 mg | ORAL_CAPSULE | Freq: Every day | ORAL | Status: DC
  Administered 2014-10-25: 0.5 mg via ORAL
  Filled 2014-10-24: qty 1

## 2014-10-24 MED ORDER — SENNOSIDES-DOCUSATE SODIUM 8.6-50 MG PO TABS
2.0000 | ORAL_TABLET | Freq: Every day | ORAL | Status: DC
  Administered 2014-10-24: 2 via ORAL
  Filled 2014-10-24: qty 2

## 2014-10-24 MED ORDER — LORAZEPAM 0.5 MG PO TABS
0.5000 mg | ORAL_TABLET | Freq: Three times a day (TID) | ORAL | Status: DC | PRN
  Administered 2014-10-25 (×2): 0.5 mg via ORAL
  Filled 2014-10-24 (×2): qty 1

## 2014-10-24 MED ORDER — IBUPROFEN 200 MG PO TABS
600.0000 mg | ORAL_TABLET | Freq: Three times a day (TID) | ORAL | Status: DC | PRN
  Administered 2014-10-25: 600 mg via ORAL
  Filled 2014-10-24: qty 3

## 2014-10-24 MED ORDER — LAMOTRIGINE 50 MG PO TBDP: 125.0000 mg | ORAL_TABLET | Freq: Every day | ORAL | Status: DC

## 2014-10-24 MED ORDER — PANTOPRAZOLE SODIUM 40 MG PO TBEC
40.0000 mg | DELAYED_RELEASE_TABLET | Freq: Every day | ORAL | Status: DC
  Administered 2014-10-25: 40 mg via ORAL
  Filled 2014-10-24: qty 1

## 2014-10-24 MED ORDER — ACETAMINOPHEN 325 MG PO TABS
650.0000 mg | ORAL_TABLET | Freq: Once | ORAL | Status: AC
  Administered 2014-10-24: 650 mg via ORAL
  Filled 2014-10-24: qty 2

## 2014-10-24 MED ORDER — TRAMADOL HCL 50 MG PO TABS
100.0000 mg | ORAL_TABLET | Freq: Four times a day (QID) | ORAL | Status: DC | PRN
  Administered 2014-10-24 – 2014-10-25 (×2): 100 mg via ORAL
  Filled 2014-10-24 (×2): qty 2

## 2014-10-24 MED ORDER — OXYCODONE-ACETAMINOPHEN 5-325 MG PO TABS
1.0000 | ORAL_TABLET | Freq: Once | ORAL | Status: AC
  Administered 2014-10-25: 1 via ORAL
  Filled 2014-10-24: qty 1

## 2014-10-24 MED ORDER — LIDOCAINE 5 % EX PTCH
3.0000 | MEDICATED_PATCH | Freq: Every day | CUTANEOUS | Status: DC
  Administered 2014-10-24: 3 via TRANSDERMAL
  Filled 2014-10-24 (×2): qty 3

## 2014-10-24 MED ORDER — NICOTINE 21 MG/24HR TD PT24: 21.0000 mg | MEDICATED_PATCH | Freq: Every day | TRANSDERMAL | Status: DC | PRN

## 2014-10-24 MED ORDER — HALOPERIDOL LACTATE 5 MG/ML IJ SOLN: 2.5000 mg | Freq: Every day | INTRAMUSCULAR | Status: DC | PRN

## 2014-10-24 MED ORDER — DOCUSATE SODIUM 100 MG PO CAPS
100.0000 mg | ORAL_CAPSULE | Freq: Two times a day (BID) | ORAL | Status: DC
  Administered 2014-10-24 – 2014-10-25 (×2): 100 mg via ORAL
  Filled 2014-10-24 (×2): qty 1

## 2014-10-24 MED ORDER — ACETAMINOPHEN 325 MG PO TABS: 650.0000 mg | ORAL_TABLET | ORAL | Status: DC | PRN

## 2014-10-24 MED ORDER — OXYCODONE HCL 5 MG PO TABS
10.0000 mg | ORAL_TABLET | Freq: Three times a day (TID) | ORAL | Status: DC | PRN
Start: 2014-10-24 — End: 2014-10-25
  Administered 2014-10-24 – 2014-10-25 (×2): 10 mg via ORAL
  Filled 2014-10-24 (×2): qty 2

## 2014-10-24 MED ORDER — LEVOTHYROXINE SODIUM 88 MCG PO TABS
88.0000 ug | ORAL_TABLET | Freq: Every day | ORAL | Status: DC
Start: 2014-10-25 — End: 2014-10-25
  Administered 2014-10-25: 88 ug via ORAL
  Filled 2014-10-24 (×2): qty 1

## 2014-10-24 MED ORDER — RIVASTIGMINE 4.6 MG/24HR TD PT24
13.3000 mg | MEDICATED_PATCH | Freq: Every day | TRANSDERMAL | Status: DC
  Administered 2014-10-24 – 2014-10-25 (×2): 13.8 mg via TRANSDERMAL
  Filled 2014-10-24 (×2): qty 3

## 2014-10-24 MED ORDER — METHOCARBAMOL 500 MG PO TABS
500.0000 mg | ORAL_TABLET | Freq: Three times a day (TID) | ORAL | Status: DC
  Administered 2014-10-24 – 2014-10-25 (×4): 500 mg via ORAL
  Filled 2014-10-24 (×4): qty 1

## 2014-10-24 MED ORDER — ZOLPIDEM TARTRATE 5 MG PO TABS
5.0000 mg | ORAL_TABLET | Freq: Every evening | ORAL | Status: DC | PRN
  Administered 2014-10-24: 5 mg via ORAL
  Filled 2014-10-24: qty 1

## 2014-10-24 MED ORDER — ONDANSETRON HCL 4 MG PO TABS: 4.0000 mg | ORAL_TABLET | Freq: Three times a day (TID) | ORAL | Status: DC | PRN

## 2014-10-24 MED ORDER — LAMOTRIGINE 25 MG PO TABS
125.0000 mg | ORAL_TABLET | ORAL | Status: DC
  Administered 2014-10-24: 125 mg via ORAL
  Filled 2014-10-24 (×2): qty 1

## 2014-10-24 MED ORDER — ESCITALOPRAM OXALATE 10 MG PO TABS
10.0000 mg | ORAL_TABLET | Freq: Every day | ORAL | Status: DC
  Administered 2014-10-24: 10 mg via ORAL
  Filled 2014-10-24: qty 1

## 2014-10-24 MED ORDER — ALUM & MAG HYDROXIDE-SIMETH 200-200-20 MG/5ML PO SUSP: 30.0000 mL | ORAL | Status: DC | PRN

## 2014-10-24 MED ORDER — TAMSULOSIN HCL 0.4 MG PO CAPS
0.4000 mg | ORAL_CAPSULE | Freq: Every day | ORAL | Status: DC
  Administered 2014-10-24: 0.4 mg via ORAL
  Filled 2014-10-24: qty 1

## 2014-10-24 NOTE — ED Notes (Signed)
Bed: WA26 Expected date:  Expected time:  Means of arrival:  Comments: 

## 2014-10-24 NOTE — ED Notes (Signed)
Bed: WA04 Expected date:  Expected time:  Means of arrival:  Comments: Combative but not combative

## 2014-10-24 NOTE — ED Notes (Signed)
Pt upset.  Has had back pain x 30 years and not getting it fixed.  Went over med list with patient and told what we could offer.  Pt still upset and wants to talk to admin.  Notified AC to speak with pt.

## 2014-10-24 NOTE — BH Assessment (Signed)
[facility] Assessment Progress Note  The following facilities have been contacted to seek placement for this pt, with results as noted:  Beds available, information sent, decision pending:  [facility]  No beds available but accepting referrals for future consideration; information sent:  [facility]:  [facility] (total care)  At capacity:  [facility] [place], [place] Triage Specialist [phone]

## 2014-10-24 NOTE — ED Provider Notes (Signed)
CSN: [id number]     Arrival date & time 10/24/14  1027 History   First MD Initiated Contact with Patient 10/24/14 1041     Chief Complaint  Patient presents with  . Back Pain     (Consider location/radiation/quality/duration/timing/severity/associated sxs/prior Treatment) Patient is a 81 y.o. male presenting with back pain.  Back Pain Location:  Generalized Quality:  Aching Radiates to:  Does not radiate Pain severity:  Severe Pain is:  Same all the time Onset quality:  Gradual Timing:  Constant Progression:  Unchanged Chronicity:  Chronic Context comment:  Sent by nursing home for ? fall and increased aggression Relieved by:  Nothing Worsened by:  Movement, twisting, touching and sitting Associated symptoms: no abdominal pain, no fever, no numbness, no paresthesias and no weakness     Past Medical History  Diagnosis Date  . Dementia   . Hypertension   . Arthritis   . Hypothyroidism   . Chronic back pain   . Anxiety disorder   . Gastroesophageal reflux disease   . Polypharmacy     History of hospitalizations for altered mental status related to medications  . Chronic constipation     with fecal impactions, recurrent  . Suicidal ideation 06/06/2011  . Traumatic subdural hematoma 10/01/2013  . Multiple facial fractures 10/02/2013  . Basilar skull fracture 10/02/2013  . Acute blood loss anemia 10/02/2013   Past Surgical History  Procedure Laterality Date  . Appendectomy    . Removal of nonrestorable teeth numbers 2, 4, 6, 12, 13, 18, 29     Family History  Problem Relation Age of Onset  . Heart disease Father    History  Substance Use Topics  . Smoking status: Never Smoker   . Smokeless tobacco: Never Used  . Alcohol Use: No     Comment: Pt denies    Review of Systems  Constitutional: Negative for fever.  Gastrointestinal: Negative for abdominal pain.  Musculoskeletal: Positive for back pain.  Neurological: Negative for weakness, numbness and paresthesias.   All other systems reviewed and are negative.     Allergies  Review of patient's allergies indicates no known allergies.  Home Medications   Prior to Admission medications   Medication Sig Start Date End Date Taking? Authorizing Provider  docusate sodium (COLACE) 100 MG capsule Take 100 mg by mouth 2 (two) times daily.   Yes Historical Provider, MD  dutasteride (AVODART) 0.5 MG capsule Take 0.5 mg by mouth every morning.    Yes Historical Provider, MD  escitalopram (LEXAPRO) 10 MG tablet Take 10 mg by mouth at bedtime.    Yes Historical Provider, MD  eszopiclone (LUNESTA) 2 MG TABS tablet Take 1 tablet (2 mg total) by mouth at bedtime. Take immediately before bedtime 09/24/14  Yes Lauree Chandler, NP  haloperidol lactate (HALDOL) 5 MG/ML injection Inject 2.5 mg into the muscle daily as needed (agitation/anxiety).   Yes Historical Provider, MD  LamoTRIgine 50 MG TBDP Take 125 mg by mouth daily.    Yes Historical Provider, MD  levothyroxine (SYNTHROID, LEVOTHROID) 88 MCG tablet Take 88 mcg by mouth at bedtime.   Yes Historical Provider, MD  lidocaine (LIDODERM) 5 % Place 3 patches onto the skin 2 (two) times daily. Apply 1 patch to left side twice daily related to chronic pain & 1 patch to lower back twice daily for pain. Also apply 1 patch to cervical spine-neck twice daily.   Yes Historical Provider, MD  Linaclotide (LINZESS) 290 MCG CAPS capsule Take 290  mcg by mouth every morning.    Yes Historical Provider, MD  LORazepam (ATIVAN) 0.5 MG tablet Take one tablet by mouth every 8 hours as needed for anixety Patient taking differently: Take 0.5 mg by mouth every 8 (eight) hours as needed for anxiety.  10/01/14  Yes Lauree Chandler, NP  methadone (DOLOPHINE) 10 MG tablet Take 1 tablet (10 mg total) by mouth every 12 (twelve) hours. Patient taking differently: Take 15 mg by mouth every 12 (twelve) hours.  05/07/14  Yes Charlett Blake, MD  methocarbamol (ROBAXIN) 500 MG tablet Take 500 mg by  mouth every 8 (eight) hours.    Yes Historical Provider, MD  Multiple Vitamins-Minerals (MULTIVITAMINS THER. W/MINERALS) TABS Take 1 tablet by mouth daily.   Yes Historical Provider, MD  omeprazole (PRILOSEC) 40 MG capsule Take 40 mg by mouth daily.   Yes Historical Provider, MD  oxyCODONE (ROXICODONE) 5 MG immediate release tablet 2 tablets by mouth every 8 hours scheduled and 2 by mouth every 8 hours as needed for pain Patient taking differently: Take 10 mg by mouth every 8 (eight) hours as needed for moderate pain or severe pain.  10/16/14  Yes Gildardo Cranker, DO  polyethylene glycol (MIRALAX / GLYCOLAX) packet Take 17 g by mouth daily. 02/24/14  Yes Gerlene Fee, NP  Rivastigmine 13.3 MG/24HR PT24 Place 1 patch onto the skin every morning.   Yes Historical Provider, MD  senna-docusate (SENOKOT-S) 8.6-50 MG per tablet Take 2 tablets by mouth at bedtime. 09/26/14  Yes Gerlene Fee, NP  tamsulosin (FLOMAX) 0.4 MG CAPS Take 0.4 mg by mouth at bedtime.    Yes Historical Provider, MD  traMADol (ULTRAM) 50 MG tablet Take two tablets by mouth every 6 hours for pain MAX 300 MG TRAMADOL DAILY Patient taking differently: Take 100 mg by mouth every 6 (six) hours as needed. MAX 300 MG TRAMADOL DAILY 09/24/14  Yes Lauree Chandler, NP  methadone (DOLOPHINE) 5 MG tablet Take three tablets by mouth twice daily for pain Patient not taking: Reported on 10/24/2014 09/16/14   Tiffany L Reed, DO  Oxycodone HCl 10 MG TABS Take 1 tablet (10 mg total) by mouth every 6 (six) hours as needed. Patient not taking: Reported on 10/24/2014 05/07/14   Charlett Blake, MD   BP 158/73 mmHg  Pulse 72  Temp(Src) 98.4 F (36.9 C) (Oral)  Resp 16  SpO2 95% Physical Exam  Constitutional: He is oriented to person, place, and time. He appears well-developed and well-nourished.  HENT:  Head: Normocephalic and atraumatic.  Eyes: Conjunctivae and EOM are normal.  Neck: Normal range of motion. Neck supple.  Cardiovascular: Normal  rate, regular rhythm and normal heart sounds.   Pulmonary/Chest: Effort normal and breath sounds normal. No respiratory distress.  Abdominal: He exhibits no distension. There is no tenderness. There is no rebound and no guarding.  Musculoskeletal: Normal range of motion.       Cervical back: He exhibits tenderness and bony tenderness.       Thoracic back: He exhibits tenderness and bony tenderness.       Lumbar back: He exhibits tenderness and bony tenderness.  Neurological: He is alert and oriented to person, place, and time.  Skin: Skin is warm and dry.  Vitals reviewed.   ED Course  Procedures (including critical care time) Labs Review Labs Reviewed  CBC WITH DIFFERENTIAL/PLATELET - Abnormal; Notable for the following:    WBC 3.8 (*)    RBC 3.56 (*)  Hemoglobin 11.9 (*)    HCT 35.9 (*)    MCV 100.8 (*)    All other components within normal limits  BASIC METABOLIC PANEL - Abnormal; Notable for the following:    Glucose, Bld 137 (*)    BUN 25 (*)    Calcium 8.6 (*)    GFR calc non Af Amer 53 (*)    All other components within normal limits  URINALYSIS, ROUTINE W REFLEX MICROSCOPIC (NOT AT [facility])    Imaging Review Dg Chest 2 View  10/24/2014   CLINICAL DATA:  Pain following fall  EXAM: CHEST  2 VIEW  COMPARISON:  October 03, 2013  FINDINGS: There is mild scarring in the left base. There is no edema or consolidation. Heart is upper normal in size with pulmonary vascularity within normal limits. There is atherosclerotic change in aorta. No adenopathy. No pneumothorax. There is degenerative change in the thoracic spine. No acute fracture evident.  IMPRESSION: Mild scarring left base. No edema or consolidation. No pneumothorax evident.   Electronically Signed   By: Lowella Grip III M.D.   On: 10/24/2014 12:13   Dg Thoracic Spine 2 View  10/24/2014   CLINICAL DATA:  Neck, upper back and lower back pain after fall.  EXAM: THORACIC SPINE 2 VIEWS  COMPARISON:  None.  FINDINGS: The  uppermost portion of the thoracic spine is not well seen on the lateral view, despite repeated attempts. There is no evidence of thoracic spine fracture. There is exaggerated kyphosis, with multilevel osteoarthritic changes, manifesting with disc space narrowing, osteophytosis and subchondral sclerosis. There is height loss of T10 and T12 vertebral bodies, which appears to be secondary to degenerative remodeling rather than fracture. No other significant bone abnormalities are identified.  IMPRESSION: No acute fracture or listhesis identified in the thoracic spine.  Multilevel degenerative disease, moderate to severe.   Electronically Signed   By: Fidela Salisbury M.D.   On: 10/24/2014 12:27   Dg Lumbar Spine 2-3 Views  10/24/2014   CLINICAL DATA:  Pain following fall  EXAM: LUMBAR SPINE - 2-3 VIEW  COMPARISON:  None.  FINDINGS: Frontal, lateral, and spot lumbosacral lateral images were obtained. There are 5 non-rib-bearing lumbar type vertebral bodies. There is thoracolumbar dextroscoliosis. There is no acute fracture or spondylolisthesis. There is moderately severe disc space narrowing at L3-4 and L4-5. There is moderate disc space narrowing at L5-S1. There is also moderate narrowing in the lower thoracic region. There are anterior osteophytes at multiple levels, largest at L4 and L5. No erosive change. There are scattered foci of atherosclerotic calcification in the aorta. There are apparent granulomas in the right upper quadrant region.  IMPRESSION: Multilevel osteoarthritic change. Scoliosis. No acute fracture or spondylolisthesis.   Electronically Signed   By: Lowella Grip III M.D.   On: 10/24/2014 12:18   Ct Head Wo Contrast  10/24/2014   CLINICAL DATA:  81 year old male with dementia and possible fall. Initial encounter.  EXAM: CT HEAD WITHOUT CONTRAST  CT CERVICAL SPINE WITHOUT CONTRAST  TECHNIQUE: Multidetector CT imaging of the head and cervical spine was performed following the standard  protocol without intravenous contrast. Multiplanar CT image reconstructions of the cervical spine were also generated.  COMPARISON:  01/30/2014.  FINDINGS: CT HEAD FINDINGS  Remote left zygomatic arch fracture.  No acute calvarial fracture.  No intracranial hemorrhage.  Small vessel disease type changes without CT evidence of large acute infarct.  Atrophy without hydrocephalus.  No intracranial mass lesion noted on this unenhanced  exam.  CT CERVICAL SPINE FINDINGS  No cervical spine fracture. Cervical spondylotic changes with spinal stenosis most notable C5-6 with mild cord compression greater on the left. If ligamentous injury or cord injury were of high clinical concern, MR imaging could be obtained for further delineation.  Remote Schmorl's node deformity superior endplate T2.  IMPRESSION: CT HEAD  Remote left zygomatic arch fracture.  No acute calvarial fracture.  No intracranial hemorrhage.  Small vessel disease type changes without CT evidence of large acute infarct.  Atrophy without hydrocephalus.  CT CERVICAL SPINE  No cervical spine fracture.  Cervical spondylotic changes with spinal stenosis most notable C5-6 with mild cord compression greater on the left.   Electronically Signed   By: Genia Del M.D.   On: 10/24/2014 12:29   Ct Cervical Spine Wo Contrast  10/24/2014   CLINICAL DATA:  81 year old male with dementia and possible fall. Initial encounter.  EXAM: CT HEAD WITHOUT CONTRAST  CT CERVICAL SPINE WITHOUT CONTRAST  TECHNIQUE: Multidetector CT imaging of the head and cervical spine was performed following the standard protocol without intravenous contrast. Multiplanar CT image reconstructions of the cervical spine were also generated.  COMPARISON:  01/30/2014.  FINDINGS: CT HEAD FINDINGS  Remote left zygomatic arch fracture.  No acute calvarial fracture.  No intracranial hemorrhage.  Small vessel disease type changes without CT evidence of large acute infarct.  Atrophy without hydrocephalus.  No  intracranial mass lesion noted on this unenhanced exam.  CT CERVICAL SPINE FINDINGS  No cervical spine fracture. Cervical spondylotic changes with spinal stenosis most notable C5-6 with mild cord compression greater on the left. If ligamentous injury or cord injury were of high clinical concern, MR imaging could be obtained for further delineation.  Remote Schmorl's node deformity superior endplate T2.  IMPRESSION: CT HEAD  Remote left zygomatic arch fracture.  No acute calvarial fracture.  No intracranial hemorrhage.  Small vessel disease type changes without CT evidence of large acute infarct.  Atrophy without hydrocephalus.  CT CERVICAL SPINE  No cervical spine fracture.  Cervical spondylotic changes with spinal stenosis most notable C5-6 with mild cord compression greater on the left.   Electronically Signed   By: Genia Del M.D.   On: 10/24/2014 12:29     EKG Interpretation None      MDM   Final diagnoses:  Encounter for preadmission testing    81 y.o. male with pertinent PMH of dementia, chronic back pain, prior SI, anxiety presents from skilled nursing facility after he became increasingly aggressive with staff after he insisted that he fell. No witnessed fall and no falls reported by overlying staff, however patient sent for evaluation. On my examination the patient is oriented 3 and has a consistent story across both nursing staff, skilled nursing staff, and myself and that he states that he fell last night trying to get out of bed. He denies loss consciousness, chest pain, other systemic symptoms. On arrival today vitals signs and physical exam as above. The patient has no external signs of trauma. He does have diffuse spinal tenderness. He states he has a chronic history of back pain and states this is no different. He also endorses headache.  Workup as above unremarkable for acute pathology. I want to discharge the patient and he stated that he was nonsuicidal and refused to tell me  his plan.  Consulted TTS.    I have reviewed all laboratory and imaging studies if ordered as above  1. Encounter for preadmission testing  Debby Freiberg, MD 10/24/14 [phone]

## 2014-10-24 NOTE — ED Notes (Signed)
Per EMS, pt from [facility].  Pt here per facility that pt was combative and more anxious than normal.  Pt is complaining of pain in lumbar region which is chronic.  Pt has dementia and wheelchair bound.  No report of fever or diarrhea.  Vitals: 140/80, hr 90, resp 16, cbg 132.

## 2014-10-24 NOTE — Progress Notes (Signed)
Per facility, pt has had some increased confusion. Facility does not mention any new behaviors or combativeness. Per facility patient had a fall last night and refused to come to the ed. Pt was seen by his psych dr today who told the dr. He wanted to come be checked out due to the fall.   Belia Heman, [place] [employer]  [employer] [phone]

## 2014-10-24 NOTE — ED Notes (Signed)
Offered pt percocet per order rec'd after pt c/o pain.  Pt now refusing stating that it won't help.  States nothing helps.  Explained we are waiting for xrays to further plan of treatment.

## 2014-10-24 NOTE — BH Assessment (Signed)
Assessment Note  George Foster is an 81 y.o. male presenting to [facility] via EMS. Patient has a history of dementia, anxiety disorder, and suicidal ideations. Patient presenting today has complaints of wosening pain. Additionally, patient reports a fall in which he hit his head and neck. Reportedly ED staff tried to offer patient percocet but he refused. Patient reporting suicidal ideations with no plan or intent. Trigger for his suicidal ideations are due to worsening pain to his neck, spine, and head. Depressive symptoms include hopelessness and loss of interest in usual pleasures. Patient has no history of prior suicide attempts. He denies access to means. He denies HI and AVH's. No alcohol and drug use reported. Patient has received inpatient psychiatric hospitalization at [facility] for agreesive behaviors toward his spouse. He does not report any outpatient mental provider(s).    Spouse George, Foster [phone]:  Axis I: Depressive Disorder, Recurrent, Severe without psychotic features, Anxiety Disorder NOS, Dementia (Severity Unk) Axis II: Deferred  Past Medical History  Diagnosis Date  . Dementia   . Hypertension   . Arthritis   . Hypothyroidism   . Chronic back pain   . Anxiety disorder   . Gastroesophageal reflux disease   . Polypharmacy     History of hospitalizations for altered mental status related to medications  . Chronic constipation     with fecal impactions, recurrent  . Suicidal ideation 06/06/2011  . Traumatic subdural hematoma 10/01/2013  . Multiple facial fractures 10/02/2013  . Basilar skull fracture 10/02/2013  . Acute blood loss anemia 10/02/2013   Axis IV: other psychosocial or environmental problems, problems related to social environment, problems with access to health care services and problems with primary support group Axis V: 31-40 impairment in reality testing  Past Medical History:  Past Medical History  Diagnosis Date  . Dementia   .  Hypertension   . Arthritis   . Hypothyroidism   . Chronic back pain   . Anxiety disorder   . Gastroesophageal reflux disease   . Polypharmacy     History of hospitalizations for altered mental status related to medications  . Chronic constipation     with fecal impactions, recurrent  . Suicidal ideation 06/06/2011  . Traumatic subdural hematoma 10/01/2013  . Multiple facial fractures 10/02/2013  . Basilar skull fracture 10/02/2013  . Acute blood loss anemia 10/02/2013    Past Surgical History  Procedure Laterality Date  . Appendectomy    . Removal of nonrestorable teeth numbers 2, 4, 6, 12, 13, 18, 29      Family History:  Family History  Problem Relation Age of Onset  . Heart disease Father     Social History:  reports that he has never smoked. He has never used smokeless tobacco. He reports that he does not drink alcohol or use illicit drugs.  Additional Social History:  Alcohol / Drug Use Pain Medications: SEE MAR Prescriptions: SEE MAR Over the Counter: SEE MAR History of alcohol / drug use?: No history of alcohol / drug abuse  CIWA: CIWA-Ar BP: 142/88 mmHg Pulse Rate: 77 COWS:    Allergies: No Known Allergies  Home Medications:  (Not in a hospital admission)  OB/GYN Status:  No LMP for male patient.  General Assessment Data Location of Assessment: [facility] ED TTS Assessment: In system Is this an Initial Assessment or a Re-assessment for this encounter?: Re-Assessment Marital status: Married [place] name:  (n/a) Is patient pregnant?: No Pregnancy Status: No Living Arrangements: Alone Can pt  return to current living arrangement?: No Admission Status: Voluntary Is patient capable of signing voluntary admission?: No Referral Source: Self/Family/Friend Insurance type:  (MCR/MCD)     Crisis Care Plan Living Arrangements: Alone Name of Psychiatrist:  (No psychiatrist ) Name of Therapist:  (No therapist )  Education Status Is patient currently in school?:  No Current Grade:  (n/a) Highest grade of school patient has completed:  (n/a) Name of school:  (n/a) Contact person:  (n/a)  Risk to self with the past 6 months Suicidal Ideation: Yes-Currently Present Has patient been a risk to self within the past 6 months prior to admission? : Yes Suicidal Intent: No Has patient had any suicidal intent within the past 6 months prior to admission? : No Is patient at risk for suicide?: No Suicidal Plan?: No Has patient had any suicidal plan within the past 6 months prior to admission? : No Access to Means: No What has been your use of drugs/alcohol within the last 12 months?:  (pt denies ) Previous Attempts/Gestures: No How many times?:  (0) Other Self Harm Risks:  (n/a) Triggers for Past Attempts:  (chronic pain to neck, head, and spine) Intentional Self Injurious Behavior: None Family Suicide History: Unknown Recent stressful life event(s): Other (Comment) (chronic pain to neck, head, and spine) Persecutory voices/beliefs?: No Depression: Yes Depression Symptoms: Feeling angry/irritable, Feeling worthless/self pity, Loss of interest in usual pleasures, Guilt, Isolating, Fatigue, Tearfulness, Insomnia, Despondent Substance abuse history and/or treatment for substance abuse?: No Suicide prevention information given to non-admitted patients: Not applicable  Risk to Others within the past 6 months Homicidal Ideation: No Does patient have any lifetime risk of violence toward others beyond the six months prior to admission? : No Thoughts of Harm to Others: No Current Homicidal Intent: No Current Homicidal Plan: No Access to Homicidal Means: No Identified Victim:  (n/a) History of harm to others?: No Assessment of Violence: None Noted Violent Behavior Description:  (patient calm and cooperative ) Does patient have access to weapons?: No Criminal Charges Pending?: No Does patient have a court date: No Is patient on probation?:  No  Psychosis Hallucinations: None noted Delusions: None noted  Mental Status Report Appearance/Hygiene: In scrubs Eye Contact: Good Motor Activity: Freedom of movement Speech: Logical/coherent Level of Consciousness: Alert Mood: Depressed Affect: Appropriate to circumstance Anxiety Level: Minimal Thought Processes: Relevant Judgement: Impaired Orientation: Time, Situation, Place, Person Obsessive Compulsive Thoughts/Behaviors: None  Cognitive Functioning Concentration: Normal IQ: Average Insight: Poor Impulse Control: Poor Appetite: Good Weight Loss:  (none reported) Weight Gain:  (none reported) Sleep: Decreased Total Hours of Sleep:  (varies ) Vegetative Symptoms: None  ADLScreening [facility] Assessment Services) Patient's cognitive ability adequate to safely complete daily activities?: Yes Patient able to express need for assistance with ADLs?: Yes Independently performs ADLs?: No  Prior Inpatient Therapy Prior Inpatient Therapy: No Prior Therapy Dates:  (n/a) Prior Therapy Facilty/Provider(s):  (n/a) Reason for Treatment:  (n/a)  Prior Outpatient Therapy Prior Outpatient Therapy: No Prior Therapy Dates:  (n/a) Prior Therapy Facilty/Provider(s):  (n/a) Reason for Treatment:  (n/a) Does patient have an ACCT team?: No Does patient have Intensive In-House Services?  : No Does patient have [facility] services? : No Does patient have P4CC services?: No  ADL Screening (condition at time of admission) Patient's cognitive ability adequate to safely complete daily activities?: Yes Is the patient deaf or have difficulty hearing?: No Does the patient have difficulty seeing, even when wearing glasses/contacts?: Yes Does the patient have difficulty concentrating, remembering,  or making decisions?: No Patient able to express need for assistance with ADLs?: Yes Does the patient have difficulty dressing or bathing?: No Independently performs ADLs?: No Communication:  Independent Dressing (OT): Needs assistance Is this a change from baseline?: Pre-admission baseline Grooming: Needs assistance Is this a change from baseline?: Pre-admission baseline Feeding: Needs assistance Is this a change from baseline?: Pre-admission baseline Bathing: Needs assistance Is this a change from baseline?: Pre-admission baseline Toileting: Needs assistance Is this a change from baseline?: Pre-admission baseline In/Out Bed: Needs assistance Is this a change from baseline?: Pre-admission baseline Walks in Home: Independent with device (comment) Does the patient have difficulty walking or climbing stairs?: No Weakness of Legs: None Weakness of Arms/Hands: None  Home Assistive Devices/Equipment Home Assistive Devices/Equipment: Other (Comment) (Pt uses a wheelchair )    Abuse/Neglect Assessment (Assessment to be complete while patient is alone) Physical Abuse: Denies Verbal Abuse: Denies Sexual Abuse: Denies Exploitation of patient/patient's resources: Denies Self-Neglect: Denies Values / Beliefs Cultural Requests During Hospitalization: None Spiritual Requests During Hospitalization: None   Advance Directives (For Healthcare) Does patient have an advance directive?: Yes Type of Advance Directive: Out of facility DNR (pink MOST or yellow form) Pre-existing out of facility DNR order (yellow form or pink MOST form): Pink MOST form placed in chart (order not valid for inpatient use) Does patient want to make changes to advanced directive?: No - Patient declined Copy of advanced directive(s) in chart?: Yes    Additional Information 1:1 In Past 12 Months?: No CIRT Risk: No Elopement Risk: No Does patient have medical clearance?: Yes     Disposition:  Disposition Initial Assessment Completed for this Encounter: Yes Disposition of Patient: Inpatient treatment program (Refer patient to a [facility] facility, per Reginold Agent, NP )  On Site Evaluation by:    Reviewed with Physician:    Waldon Merl [facility] 10/24/2014 3:01 PM

## 2014-10-24 NOTE — ED Notes (Signed)
Pt eating french fries

## 2014-10-24 NOTE — ED Provider Notes (Signed)
19:40- he has been medically cleared for evaluation treatment by psychiatry. He has been seen by TTS, who plan placing him. They will also have psychiatry see him in the morning. Holding orders and medications have been ordered.  Daleen Bo, MD 10/24/14 [phone]

## 2014-10-25 DIAGNOSIS — F0391 Unspecified dementia with behavioral disturbance: Secondary | ICD-10-CM | POA: Diagnosis not present

## 2014-10-25 MED ORDER — TRAZODONE HCL 50 MG PO TABS: 50.0000 mg | ORAL_TABLET | Freq: Every evening | ORAL | Status: DC | PRN

## 2014-10-25 NOTE — Progress Notes (Signed)
Pt psychiatrically stable for discharge back to [facility]. RN Can call report to [phone]. Patient to be transported back by ptar.   Belia Heman, [place] Work  [employer] [phone]

## 2014-10-25 NOTE — ED Notes (Addendum)
[facility] contacted in [place] report given to [place], Therapist, sports.

## 2014-10-25 NOTE — BH Assessment (Addendum)
Patient was reassessed on 10/25/2014 at beginning of shift.   Patient was asleep but was able to wake up and provide his name and date of birth. Patient appeared to be oriented to person, place, and situation. Patient states that he currently lives in [place] and does not like it there but has no where else to go. Patient states "I don't like the way it's operated." Patient states that he fell "about ten days ago" and he is in pain. Patient states that he is taking his medications and is prescribed oxycodone and percocet but is unable to remember the rest of his medications. Patient denies current thoughts of hurting himself and others. Patient states that he does not hear or see things that other people cannot see or hear. Patient states that he would be safe going home and denies SI/HI and psychosis.  Rosalin Hawking, LCSW Therapeutic Triage Specialist [place] 10/25/2014 8:39 AM   Dr. Darleene Cleaver and Waylan Boga, DNP recommend geropsych placement at this time based on patients previous behavior. TTS to seek placement.   Rosalin Hawking, LCSW Therapeutic Triage Specialist [place] 10/25/2014 9:20 AM

## 2014-10-25 NOTE — BHH Suicide Risk Assessment (Signed)
Suicide Risk Assessment  Discharge Assessment   [facility] Discharge Suicide Risk Assessment   Demographic Factors:  Male, Age 81 or older and Caucasian  Total Time spent with patient: 30 minutes  Musculoskeletal: Strength & Muscle Tone: within normal limits Gait & Station: normal Patient leans: N/A  Psychiatric Specialty Exam: Physical Exam  Review of Systems  Constitutional: Negative.   HENT:       Negative findings  Eyes: Negative.   Respiratory: Negative.   Cardiovascular: Negative.   Gastrointestinal: Negative.   Musculoskeletal: Positive for neck pain.  Skin: Negative.   Neurological: Negative.   Endo/Heme/Allergies: Negative.   Psychiatric/Behavioral: Positive for depression and suicidal ideas.    Blood pressure 155/60, pulse 75, temperature 99 F (37.2 C), temperature source Oral, resp. rate 18, SpO2 94 %.There is no weight on file to calculate BMI.  General Appearance: Casual  Eye Contact::  Good  Speech:  Normal Rate  Volume:  Normal  Mood:  Depressed, mild  Affect:  Congruent  Thought Process:  Coherent  Orientation:  Full (Time, Place, and Person)  Thought Content:  WDL  Suicidal Thoughts:  No  Homicidal Thoughts:  No  Memory:  Immediate;   Good Recent;   Fair Remote;   Good  Judgement:  Good  Insight:  Good  Psychomotor Activity:  Decreased  Concentration:  Good  Recall:  Good  Fund of Knowledge:Good  Language: Good  Akathisia:  No  Handed:  Right  AIMS (if indicated):     Assets:  Housing Leisure Time Social Support  ADL's:  Intact  Cognition: Impaired,  Mild  Sleep:          Has this patient used any form of tobacco in the last 30 days? (Cigarettes, Smokeless Tobacco, Cigars, and/or Pipes) No  Mental Status Per Nursing Assessment::   On Admission:   Passive suicidal ideations due to pain issues  Current Mental Status by Physician: NA  Loss Factors: NA  Historical Factors: NA  Risk Reduction Factors:   Sense of responsibility to  family, Living with another person, especially a relative, Positive social support and Positive therapeutic relationship  Continued Clinical Symptoms:  None  Cognitive Features That Contribute To Risk:  None    Suicide Risk:  Minimal: No identifiable suicidal ideation.  Patients presenting with no risk factors but with morbid ruminations; may be classified as minimal risk based on the severity of the depressive symptoms  Principal Problem: Dementia with behavioral disturbance Discharge Diagnoses:  Patient Active Problem List   Diagnosis Date Noted  . Dementia with behavioral disturbance [F03.91] 06/06/2011    Priority: High  . Hereditary and idiopathic peripheral neuropathy [G60.9] 07/01/2014  . Insomnia [G47.00] 04/19/2014  . Fall [W19.XXXA] 10/02/2013  . BPH (benign prostatic hyperplasia) [N40.0] 02/17/2013  . Osteoarthritis cervical spine [M47.812] 02/17/2013  . Chronic constipation [K59.00]   . Chronic back pain [M54.9, G89.29] 06/08/2011  . Hypertension [I10]   . Hypothyroidism [E03.9]   . Gastroesophageal reflux disease [K21.9]   . Anxiety disorder [F41.9]       Plan Of Care/Follow-up recommendations:  Activity:  as tolerated  Diet:  heart healthy diet  Is patient on multiple antipsychotic therapies at discharge:  No   Has Patient had three or more failed trials of antipsychotic monotherapy by history:  No  Recommended Plan for Multiple Antipsychotic Therapies: NA    LORD, JAMISON, PMH-NP 10/25/2014, 1:56 PM

## 2014-10-25 NOTE — Consult Note (Signed)
[place] [facility] Consult   Reason for Consult:  Passive suicidal ideations with aggression Referring Physician:  EDP Patient Identification: George Foster MRN:  [id number] Principal Diagnosis: Dementia with behavioral disturbance Diagnosis:   Patient Active Problem List   Diagnosis Date Noted  . Dementia with behavioral disturbance [F03.91] 06/06/2011    Priority: High  . Hereditary and idiopathic peripheral neuropathy [G60.9] 07/01/2014  . Insomnia [G47.00] 04/19/2014  . Fall [W19.XXXA] 10/02/2013  . BPH (benign prostatic hyperplasia) [N40.0] 02/17/2013  . Osteoarthritis cervical spine [M47.812] 02/17/2013  . Chronic constipation [K59.00]   . Chronic back pain [M54.9, G89.29] 06/08/2011  . Hypertension [I10]   . Hypothyroidism [E03.9]   . Gastroesophageal reflux disease [K21.9]   . Anxiety disorder [F41.9]     Total Time spent with patient: 30 minutes  Subjective:   George Foster is a 81 y.o. male patient does not warrant admission.  HPI:  George Foster is an 81 y.o. male presenting to [facility] via EMS. Patient has a history of dementia, anxiety disorder, and suicidal ideations. Patient presenting today has complaints of wosening pain. Additionally, patient reports a fall in which he hit his head and neck. Reportedly ED staff tried to offer patient percocet but he refused. Patient reporting suicidal ideations with no plan or intent. Trigger for his suicidal ideations are due to worsening pain to his neck, spine, and head. Depressive symptoms include hopelessness and loss of interest in usual pleasures. Patient has no history of prior suicide attempts. He denies access to means. He denies HI and AVH's. No alcohol and drug use reported. Patient has received inpatient psychiatric hospitalization at [facility] for agressive behaviors toward his spouse. He does not report any outpatient mental provider(s).  On assessment:  Patient denies suicidal ideations, reports  he has had passive suicidal ideations when he is in pain but would never do anything to hurt himself.  No past suicide attempts.  He does have dementia and has had an increase in anxiety recently.  George Foster was accepted to [facility] but did not want to go, stable for discharge. HPI Elements:   Location:  generalized. Quality:  acute. Severity:  mild. Timing:  intermittent. Duration:  brief. Context:  physical pain.  Past Medical History:  Past Medical History  Diagnosis Date  . Dementia   . Hypertension   . Arthritis   . Hypothyroidism   . Chronic back pain   . Anxiety disorder   . Gastroesophageal reflux disease   . Polypharmacy     History of hospitalizations for altered mental status related to medications  . Chronic constipation     with fecal impactions, recurrent  . Suicidal ideation 06/06/2011  . Traumatic subdural hematoma 10/01/2013  . Multiple facial fractures 10/02/2013  . Basilar skull fracture 10/02/2013  . Acute blood loss anemia 10/02/2013    Past Surgical History  Procedure Laterality Date  . Appendectomy    . Removal of nonrestorable teeth numbers 2, 4, 6, 12, 13, 18, 29     Family History:  Family History  Problem Relation Age of Onset  . Heart disease Father    Social History:  History  Alcohol Use No    Comment: Pt denies     History  Drug Use No    Comment: Pt denies    History   Social History  . Marital Status: Married    Spouse Name: Donia Guiles  . Number of Children: 5  . Years of  Education: 12   Occupational History  . Retired Librarian, academic in a [place]  . Smoking status: Never Smoker   . Smokeless tobacco: Never Used  . Alcohol Use: No     Comment: Pt denies  . Drug Use: No     Comment: Pt denies  . Sexual Activity: Not on file   Other Topics Concern  . None   Social History Narrative   Married.  Lives in [place] with wife.  Ambulates with a walker.   Additional Social History:     Pain Medications: SEE MAR Prescriptions: SEE MAR Over the Counter: SEE MAR History of alcohol / drug use?: No history of alcohol / drug abuse                     Allergies:  No Known Allergies  Labs:  Results for orders placed or performed during the hospital encounter of 10/24/14 (from the past 48 hour(s))  Urinalysis, Routine w reflex microscopic (not at [facility])     Status: None   Collection Time: 10/24/14 11:11 AM  Result Value Ref Range   Color, Urine YELLOW YELLOW   APPearance CLEAR CLEAR   Specific Gravity, Urine 1.020 1.005 - 1.030   pH 5.5 5.0 - 8.0   Glucose, UA NEGATIVE NEGATIVE mg/dL   Hgb urine dipstick NEGATIVE NEGATIVE   Bilirubin Urine NEGATIVE NEGATIVE   Ketones, ur NEGATIVE NEGATIVE mg/dL   Protein, ur NEGATIVE NEGATIVE mg/dL   Urobilinogen, UA 0.2 0.0 - 1.0 mg/dL   Nitrite NEGATIVE NEGATIVE   Leukocytes, UA NEGATIVE NEGATIVE    Comment: MICROSCOPIC NOT DONE ON URINES WITH NEGATIVE PROTEIN, BLOOD, LEUKOCYTES, NITRITE, OR GLUCOSE <1000 mg/dL.  CBC with Differential     Status: Abnormal   Collection Time: 10/24/14 11:22 AM  Result Value Ref Range   WBC 3.8 (L) 4.0 - 10.5 K/uL   RBC 3.56 (L) 4.22 - 5.81 MIL/uL   Hemoglobin 11.9 (L) 13.0 - 17.0 g/dL   HCT 35.9 (L) 39.0 - 52.0 %   MCV 100.8 (H) 78.0 - 100.0 fL   MCH 33.4 26.0 - 34.0 pg   MCHC 33.1 30.0 - 36.0 g/dL   RDW 13.0 11.5 - 15.5 %   Platelets 159 150 - 400 K/uL   Neutrophils Relative % 66 43 - 77 %   Neutro Abs 2.5 1.7 - 7.7 K/uL   Lymphocytes Relative 26 12 - 46 %   Lymphs Abs 1.0 0.7 - 4.0 K/uL   Monocytes Relative 6 3 - 12 %   Monocytes Absolute 0.2 0.1 - 1.0 K/uL   Eosinophils Relative 2 0 - 5 %   Eosinophils Absolute 0.1 0.0 - 0.7 K/uL   Basophils Relative 0 0 - 1 %   Basophils Absolute 0.0 0.0 - 0.1 K/uL  Basic metabolic panel     Status: Abnormal   Collection Time: 10/24/14 11:22 AM  Result Value Ref Range   Sodium 137 135 - 145 mmol/L   Potassium 3.7 3.5 - 5.1 mmol/L   Chloride  107 101 - 111 mmol/L   CO2 25 22 - 32 mmol/L   Glucose, Bld 137 (H) 65 - 99 mg/dL   BUN 25 (H) 6 - 20 mg/dL   Creatinine, Ser 1.18 0.61 - 1.24 mg/dL   Calcium 8.6 (L) 8.9 - 10.3 mg/dL   GFR calc non Af Amer 53 (L) >60 mL/min   GFR calc Af Amer >60 >60 mL/min  Comment: (NOTE) The eGFR has been calculated using the CKD EPI equation. This calculation has not been validated in all clinical situations. eGFR's persistently <60 mL/min signify possible Chronic Kidney Disease.    Anion gap 5 5 - 15    Vitals: Blood pressure 155/60, pulse 75, temperature 99 F (37.2 C), temperature source Oral, resp. rate 18, SpO2 94 %.  Risk to Self: Suicidal Ideation: Yes-Currently Present Suicidal Intent: No Is patient at risk for suicide?: No Suicidal Plan?: No Access to Means: No What has been your use of drugs/alcohol within the last 12 months?:  (pt denies ) How many times?:  (0) Other Self Harm Risks:  (n/a) Triggers for Past Attempts:  (chronic pain to neck, head, and spine) Intentional Self Injurious Behavior: None Risk to Others: Homicidal Ideation: No Thoughts of Harm to Others: No Current Homicidal Intent: No Current Homicidal Plan: No Access to Homicidal Means: No Identified Victim:  (n/a) History of harm to others?: No Assessment of Violence: None Noted Violent Behavior Description:  (patient calm and cooperative ) Does patient have access to weapons?: No Criminal Charges Pending?: No Does patient have a court date: No Prior Inpatient Therapy: Prior Inpatient Therapy: No Prior Therapy Dates:  (n/a) Prior Therapy Facilty/Provider(s):  (n/a) Reason for Treatment:  (n/a) Prior Outpatient Therapy: Prior Outpatient Therapy: No Prior Therapy Dates:  (n/a) Prior Therapy Facilty/Provider(s):  (n/a) Reason for Treatment:  (n/a) Does patient have an ACCT team?: No Does patient have Intensive In-House Services?  : No Does patient have [facility] services? : No Does patient have [facility]  services?: No  Current Facility-Administered Medications  Medication Dose Route Frequency Provider Last Rate Last Dose  . acetaminophen (TYLENOL) tablet 650 mg  650 mg Oral Q4H PRN Daleen Bo, MD      . alum & mag hydroxide-simeth (MAALOX/MYLANTA) 200-200-20 MG/5ML suspension 30 mL  30 mL Oral PRN Daleen Bo, MD      . docusate sodium (COLACE) capsule 100 mg  100 mg Oral BID Daleen Bo, MD   100 mg at 10/25/14 1029  . dutasteride (AVODART) capsule 0.5 mg  0.5 mg Oral Daily Debby Freiberg, MD   0.5 mg at 10/25/14 1019  . escitalopram (LEXAPRO) tablet 10 mg  10 mg Oral QHS Debby Freiberg, MD   10 mg at 10/24/14 2240  . haloperidol lactate (HALDOL) injection 2.5 mg  2.5 mg Intramuscular Daily PRN Debby Freiberg, MD      . ibuprofen (ADVIL,MOTRIN) tablet 600 mg  600 mg Oral Q8H PRN Daleen Bo, MD      . lamoTRIgine (LAMICTAL) tablet 125 mg  125 mg Oral Q24H Daleen Bo, MD   125 mg at 10/24/14 2120  . levothyroxine (SYNTHROID, LEVOTHROID) tablet 88 mcg  88 mcg Oral QAC breakfast Debby Freiberg, MD   88 mcg at 10/25/14 0803  . lidocaine (LIDODERM) 5 % 3 patch  3 patch Transdermal QHS Debby Freiberg, MD   3 patch at 10/24/14 2251  . Linaclotide (LINZESS) capsule 290 mcg  290 mcg Oral Daily Debby Freiberg, MD   290 mcg at 10/25/14 1019  . LORazepam (ATIVAN) tablet 0.5 mg  0.5 mg Oral Q8H PRN Debby Freiberg, MD   0.5 mg at 10/25/14 0527  . methocarbamol (ROBAXIN) tablet 500 mg  500 mg Oral 3 times per day Debby Freiberg, MD   500 mg at 10/25/14 0527  . nicotine (NICODERM CQ - dosed in mg/24 hours) patch 21 mg  21 mg Transdermal Daily PRN Daleen Bo, MD      .  ondansetron (ZOFRAN) tablet 4 mg  4 mg Oral Q8H PRN Daleen Bo, MD      . oxyCODONE (Oxy IR/ROXICODONE) immediate release tablet 10 mg  10 mg Oral Q8H PRN Debby Freiberg, MD   10 mg at 10/25/14 1036  . pantoprazole (PROTONIX) EC tablet 40 mg  40 mg Oral Daily Daleen Bo, MD   40 mg at 10/25/14 1019  . rivastigmine (EXELON)  4.6 mg/24hr 13.8 mg  13.8 mg Transdermal Daily Daleen Bo, MD   13.8 mg at 10/25/14 1021  . senna-docusate (Senokot-S) tablet 2 tablet  2 tablet Oral QHS Daleen Bo, MD   2 tablet at 10/24/14 2240  . tamsulosin (FLOMAX) capsule 0.4 mg  0.4 mg Oral QHS Daleen Bo, MD   0.4 mg at 10/24/14 2240  . traZODone (DESYREL) tablet 50 mg  50 mg Oral QHS PRN Kamalani Mastro       Current Outpatient Prescriptions  Medication Sig Dispense Refill  . docusate sodium (COLACE) 100 MG capsule Take 100 mg by mouth 2 (two) times daily.    Marland Kitchen dutasteride (AVODART) 0.5 MG capsule Take 0.5 mg by mouth every morning.     . escitalopram (LEXAPRO) 10 MG tablet Take 10 mg by mouth at bedtime.     . eszopiclone (LUNESTA) 2 MG TABS tablet Take 1 tablet (2 mg total) by mouth at bedtime. Take immediately before bedtime 90 tablet 5  . haloperidol lactate (HALDOL) 5 MG/ML injection Inject 2.5 mg into the muscle daily as needed (agitation/anxiety).    . LamoTRIgine 50 MG TBDP Take 125 mg by mouth daily.     Marland Kitchen levothyroxine (SYNTHROID, LEVOTHROID) 88 MCG tablet Take 88 mcg by mouth at bedtime.    . lidocaine (LIDODERM) 5 % Place 3 patches onto the skin 2 (two) times daily. Apply 1 patch to left side twice daily related to chronic pain & 1 patch to lower back twice daily for pain. Also apply 1 patch to cervical spine-neck twice daily.    . Linaclotide (LINZESS) 290 MCG CAPS capsule Take 290 mcg by mouth every morning.     Marland Kitchen LORazepam (ATIVAN) 0.5 MG tablet Take one tablet by mouth every 8 hours as needed for anixety (Patient taking differently: Take 0.5 mg by mouth every 8 (eight) hours as needed for anxiety. ) 90 tablet 5  . methadone (DOLOPHINE) 10 MG tablet Take 1 tablet (10 mg total) by mouth every 12 (twelve) hours. (Patient taking differently: Take 15 mg by mouth every 12 (twelve) hours. ) 60 tablet 0  . methocarbamol (ROBAXIN) 500 MG tablet Take 500 mg by mouth every 8 (eight) hours.     . Multiple Vitamins-Minerals  (MULTIVITAMINS THER. W/MINERALS) TABS Take 1 tablet by mouth daily.    Marland Kitchen omeprazole (PRILOSEC) 40 MG capsule Take 40 mg by mouth daily.    Marland Kitchen oxyCODONE (ROXICODONE) 5 MG immediate release tablet 2 tablets by mouth every 8 hours scheduled and 2 by mouth every 8 hours as needed for pain (Patient taking differently: Take 10 mg by mouth every 8 (eight) hours as needed for moderate pain or severe pain. ) 180 tablet 0  . polyethylene glycol (MIRALAX / GLYCOLAX) packet Take 17 g by mouth daily. 14 each 11  . Rivastigmine 13.3 MG/24HR PT24 Place 1 patch onto the skin every morning.    . senna-docusate (SENOKOT-S) 8.6-50 MG per tablet Take 2 tablets by mouth at bedtime. 90 tablet 11  . tamsulosin (FLOMAX) 0.4 MG CAPS Take 0.4 mg by  mouth at bedtime.     . traMADol (ULTRAM) 50 MG tablet Take two tablets by mouth every 6 hours for pain MAX 300 MG TRAMADOL DAILY (Patient taking differently: Take 100 mg by mouth every 6 (six) hours as needed. MAX 300 MG TRAMADOL DAILY) 240 tablet 5    Musculoskeletal: Strength & Muscle Tone: within normal limits Gait & Station: normal Patient leans: N/A  Psychiatric Specialty Exam: Physical Exam  Review of Systems  Constitutional: Negative.   HENT:       Negative findings  Eyes: Negative.   Respiratory: Negative.   Cardiovascular: Negative.   Gastrointestinal: Negative.   Musculoskeletal: Positive for neck pain.  Skin: Negative.   Neurological: Negative.   Endo/Heme/Allergies: Negative.   Psychiatric/Behavioral: Positive for depression and suicidal ideas.    Blood pressure 155/60, pulse 75, temperature 99 F (37.2 C), temperature source Oral, resp. rate 18, SpO2 94 %.There is no weight on file to calculate BMI.  General Appearance: Casual  Eye Contact::  Good  Speech:  Normal Rate  Volume:  Normal  Mood:  Depressed, mild  Affect:  Congruent  Thought Process:  Coherent  Orientation:  Full (Time, Place, and Person)  Thought Content:  WDL  Suicidal Thoughts:   No  Homicidal Thoughts:  No  Memory:  Immediate;   Good Recent;   Fair Remote;   Good  Judgement:  Good  Insight:  Good  Psychomotor Activity:  Decreased  Concentration:  Good  Recall:  Good  Fund of Knowledge:Good  Language: Good  Akathisia:  No  Handed:  Right  AIMS (if indicated):     Assets:  Housing Leisure Time Social Support  ADL's:  Intact  Cognition: Impaired,  Mild  Sleep:      Medical Decision Making: Review of Psycho-Social Stressors (1), Review or order clinical lab tests (1) and Review of Medication Regimen & Side Effects (2)  Treatment Plan Summary: Daily contact with patient to assess and evaluate symptoms and progress in treatment, Medication management and Plan :  Dementia with behavioral disturbances:  Continue his home medications for dementia along with his Lexapro 10 mg daily for depression and Lamictal 125 mg daily for mood stabilization.  Ativan 0.5 mg PRN for anxiety started.  Patient accepted at [facility] but did not want to go.  He wants to return to his nursing facility and follow-up with the psychiatrist there.  Plan:  No evidence of imminent risk to self or others at present.   Disposition: Discharge to nursing facility, follow-up with his regular providers  Waylan Boga, [place] 10/25/2014 1:51 PM Patient seen face-to-face for psychiatric evaluation, chart reviewed and case discussed with the physician extender and developed treatment plan. Reviewed the information documented and agree with the treatment plan. Corena Pilgrim, MD

## 2014-10-25 NOTE — ED Notes (Signed)
Spouse called to f/u regarding patients plan of care. She would like a f/u call from provider  [phone]

## 2014-10-25 NOTE — Progress Notes (Signed)
Pt expressed he is not happy with care at [facility] and requested list of facilities. Pt requested csw to speak with pt wife about a New facility. Pt wife shared that patient has been complaining about the facility for years, however she is very pleased with his care. Pt talking with wife on the phone. Pt return to [facility].   Belia Heman, [place] [employer]  [employer] [phone]

## 2014-10-30 ENCOUNTER — Emergency Department (HOSPITAL_COMMUNITY)
Admission: EM | Admit: 2014-10-30 | Discharge: 2014-10-31 | Disposition: A | Discharge status: Home / Self Care | Payer: Medicare Other | Attending: Emergency Medicine | Admitting: Emergency Medicine

## 2014-10-30 ENCOUNTER — Encounter (HOSPITAL_COMMUNITY): Payer: Self-pay | Admitting: Emergency Medicine

## 2014-10-30 ENCOUNTER — Emergency Department (HOSPITAL_COMMUNITY): Payer: Medicare Other

## 2014-10-30 DIAGNOSIS — Z8781 Personal history of (healed) traumatic fracture: Secondary | ICD-10-CM | POA: Diagnosis not present

## 2014-10-30 DIAGNOSIS — K219 Gastro-esophageal reflux disease without esophagitis: Secondary | ICD-10-CM | POA: Diagnosis not present

## 2014-10-30 DIAGNOSIS — F0390 Unspecified dementia without behavioral disturbance: Secondary | ICD-10-CM | POA: Insufficient documentation

## 2014-10-30 DIAGNOSIS — E039 Hypothyroidism, unspecified: Secondary | ICD-10-CM | POA: Diagnosis not present

## 2014-10-30 DIAGNOSIS — Z79899 Other long term (current) drug therapy: Secondary | ICD-10-CM | POA: Diagnosis not present

## 2014-10-30 DIAGNOSIS — F419 Anxiety disorder, unspecified: Secondary | ICD-10-CM | POA: Insufficient documentation

## 2014-10-30 DIAGNOSIS — M1990 Unspecified osteoarthritis, unspecified site: Secondary | ICD-10-CM | POA: Insufficient documentation

## 2014-10-30 DIAGNOSIS — G8929 Other chronic pain: Secondary | ICD-10-CM | POA: Diagnosis not present

## 2014-10-30 DIAGNOSIS — K5900 Constipation, unspecified: Secondary | ICD-10-CM | POA: Diagnosis not present

## 2014-10-30 DIAGNOSIS — Z862 Personal history of diseases of the blood and blood-forming organs and certain disorders involving the immune mechanism: Secondary | ICD-10-CM | POA: Insufficient documentation

## 2014-10-30 DIAGNOSIS — F39 Unspecified mood [affective] disorder: Secondary | ICD-10-CM | POA: Diagnosis present

## 2014-10-30 DIAGNOSIS — F911 Conduct disorder, childhood-onset type: Secondary | ICD-10-CM | POA: Insufficient documentation

## 2014-10-30 DIAGNOSIS — R4182 Altered mental status, unspecified: Secondary | ICD-10-CM | POA: Diagnosis present

## 2014-10-30 DIAGNOSIS — F29 Unspecified psychosis not due to a substance or known physiological condition: Secondary | ICD-10-CM | POA: Insufficient documentation

## 2014-10-30 DIAGNOSIS — I10 Essential (primary) hypertension: Secondary | ICD-10-CM | POA: Insufficient documentation

## 2014-10-30 LAB — URINALYSIS, ROUTINE W REFLEX MICROSCOPIC
Bilirubin Urine: NEGATIVE
Glucose, UA: NEGATIVE mg/dL
Hgb urine dipstick: NEGATIVE
Ketones, ur: NEGATIVE mg/dL
LEUKOCYTES UA: NEGATIVE
NITRITE: NEGATIVE
Protein, ur: NEGATIVE mg/dL
SPECIFIC GRAVITY, URINE: 1.021 (ref 1.005–1.030)
Urobilinogen, UA: 1 mg/dL (ref 0.0–1.0)
pH: 6.5 (ref 5.0–8.0)

## 2014-10-30 LAB — COMPREHENSIVE METABOLIC PANEL
ALBUMIN: 4 g/dL (ref 3.5–5.0)
ALK PHOS: 66 U/L (ref 38–126)
ALT: 11 U/L — ABNORMAL LOW (ref 17–63)
ANION GAP: 6 (ref 5–15)
AST: 18 U/L (ref 15–41)
BILIRUBIN TOTAL: 0.6 mg/dL (ref 0.3–1.2)
BUN: 22 mg/dL — ABNORMAL HIGH (ref 6–20)
CHLORIDE: 105 mmol/L (ref 101–111)
CO2: 27 mmol/L (ref 22–32)
Calcium: 8.8 mg/dL — ABNORMAL LOW (ref 8.9–10.3)
Creatinine, Ser: 1.11 mg/dL (ref 0.61–1.24)
GFR calc Af Amer: >60 mL/min (ref >60)
GFR calc non Af Amer: 57 mL/min — ABNORMAL LOW (ref >60)
Glucose, Bld: 115 mg/dL — ABNORMAL HIGH (ref 65–99)
Potassium: 4.5 mmol/L (ref 3.5–5.1)
SODIUM: 138 mmol/L (ref 135–145)
Total Protein: 6.5 g/dL (ref 6.5–8.1)

## 2014-10-30 LAB — CBC WITH DIFFERENTIAL/PLATELET
BASOS ABS: 0 10*3/uL (ref 0.0–0.1)
BASOS PCT: 0 % (ref 0–1)
EOS PCT: 2 % (ref 0–5)
Eosinophils Absolute: 0.1 10*3/uL (ref 0.0–0.7)
HCT: 37.4 % — ABNORMAL LOW (ref 39.0–52.0)
Hemoglobin: 12.5 g/dL — ABNORMAL LOW (ref 13.0–17.0)
Lymphocytes Relative: 16 % (ref 12–46)
Lymphs Abs: 0.8 10*3/uL (ref 0.7–4.0)
MCH: 34.6 pg — ABNORMAL HIGH (ref 26.0–34.0)
MCHC: 33.4 g/dL (ref 30.0–36.0)
MCV: 103.6 fL — AB (ref 78.0–100.0)
Monocytes Absolute: 0.3 10*3/uL (ref 0.1–1.0)
Monocytes Relative: 5 % (ref 3–12)
Neutro Abs: 3.7 10*3/uL (ref 1.7–7.7)
Neutrophils Relative %: 77 % (ref 43–77)
Platelets: 180 10*3/uL (ref 150–400)
RBC: 3.61 MIL/uL — ABNORMAL LOW (ref 4.22–5.81)
RDW: 13.3 % (ref 11.5–15.5)
WBC: 4.9 10*3/uL (ref 4.0–10.5)

## 2014-10-30 LAB — RAPID URINE DRUG SCREEN, HOSP PERFORMED
Amphetamines: NOT DETECTED
BENZODIAZEPINES: POSITIVE — AB
Barbiturates: NOT DETECTED
COCAINE: NOT DETECTED
Opiates: POSITIVE — AB
Tetrahydrocannabinol: NOT DETECTED

## 2014-10-30 LAB — ETHANOL: Alcohol, Ethyl (B): <5 mg/dL (ref <5)

## 2014-10-30 NOTE — ED Notes (Signed)
Patient lying in bed comfortably, no complaints at this time. Patient is very pleasant. Bed alarm on.
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
## 2014-10-30 NOTE — ED Provider Notes (Signed)
CSN: [id number]     Arrival date & time 10/30/14  1536 History   First MD Initiated Contact with Patient 10/30/14 1556     Chief Complaint  Patient presents with  . Altered Mental Status  . Aggressive Behavior     (Consider location/radiation/quality/duration/timing/severity/associated sxs/prior Treatment) Patient is a 81 y.o. male presenting with mental health disorder.  Mental Health Problem Presenting symptoms: aggressive behavior, agitation, delusional, hallucinations and paranoid behavior   Presenting symptoms: no disorganized speech   Patient accompanied by:  Law enforcement Degree of incapacity (severity):  Mild Onset quality:  Gradual Timing:  Constant Chronicity:  New Context: not alcohol use   Treatment compliance:  Some of the time Relieved by:  None tried Worsened by:  Nothing tried Ineffective treatments:  None tried Associated symptoms: no abdominal pain and no anxiety     Past Medical History  Diagnosis Date  . Dementia   . Hypertension   . Arthritis   . Hypothyroidism   . Chronic back pain   . Anxiety disorder   . Gastroesophageal reflux disease   . Polypharmacy     History of hospitalizations for altered mental status related to medications  . Chronic constipation     with fecal impactions, recurrent  . Suicidal ideation 06/06/2011  . Traumatic subdural hematoma 10/01/2013  . Multiple facial fractures 10/02/2013  . Basilar skull fracture 10/02/2013  . Acute blood loss anemia 10/02/2013   Past Surgical History  Procedure Laterality Date  . Appendectomy    . Removal of nonrestorable teeth numbers 2, 4, 6, 12, 13, 18, 29     Family History  Problem Relation Age of Onset  . Heart disease Father    Social History  Substance Use Topics  . Smoking status: Never Smoker   . Smokeless tobacco: Never Used  . Alcohol Use: No     Comment: Pt denies    Review of Systems  Gastrointestinal: Negative for abdominal pain.  Psychiatric/Behavioral: Positive  for hallucinations, paranoia and agitation. The patient is not nervous/anxious.       Allergies  Review of patient's allergies indicates no known allergies.  Home Medications   Prior to Admission medications   Medication Sig Start Date End Date Taking? Authorizing Provider  docusate sodium (COLACE) 100 MG capsule Take 100 mg by mouth 2 (two) times daily.    Historical Provider, MD  dutasteride (AVODART) 0.5 MG capsule Take 0.5 mg by mouth every morning.     Historical Provider, MD  escitalopram (LEXAPRO) 10 MG tablet Take 10 mg by mouth at bedtime.     Historical Provider, MD  eszopiclone (LUNESTA) 2 MG TABS tablet Take 1 tablet (2 mg total) by mouth at bedtime. Take immediately before bedtime 09/24/14   Lauree Chandler, NP  haloperidol lactate (HALDOL) 5 MG/ML injection Inject 2.5 mg into the muscle daily as needed (agitation/anxiety).    Historical Provider, MD  LamoTRIgine 50 MG TBDP Take 125 mg by mouth daily.     Historical Provider, MD  levothyroxine (SYNTHROID, LEVOTHROID) 88 MCG tablet Take 88 mcg by mouth at bedtime.    Historical Provider, MD  lidocaine (LIDODERM) 5 % Place 3 patches onto the skin 2 (two) times daily. Apply 1 patch to left side twice daily related to chronic pain & 1 patch to lower back twice daily for pain. Also apply 1 patch to cervical spine-neck twice daily.    Historical Provider, MD  Linaclotide Rolan Lipa) 290 MCG CAPS capsule Take 290 mcg by  mouth every morning.     Historical Provider, MD  LORazepam (ATIVAN) 0.5 MG tablet Take one tablet by mouth every 8 hours as needed for anixety Patient taking differently: Take 0.5 mg by mouth every 8 (eight) hours as needed for anxiety.  10/01/14   Lauree Chandler, NP  methadone (DOLOPHINE) 10 MG tablet Take 1 tablet (10 mg total) by mouth every 12 (twelve) hours. Patient taking differently: Take 15 mg by mouth every 12 (twelve) hours.  05/07/14   Charlett Blake, MD  methocarbamol (ROBAXIN) 500 MG tablet Take 500 mg  by mouth every 8 (eight) hours.     Historical Provider, MD  Multiple Vitamins-Minerals (MULTIVITAMINS THER. W/MINERALS) TABS Take 1 tablet by mouth daily.    Historical Provider, MD  omeprazole (PRILOSEC) 40 MG capsule Take 40 mg by mouth daily.    Historical Provider, MD  oxyCODONE (ROXICODONE) 5 MG immediate release tablet 2 tablets by mouth every 8 hours scheduled and 2 by mouth every 8 hours as needed for pain Patient taking differently: Take 10 mg by mouth every 8 (eight) hours as needed for moderate pain or severe pain.  10/16/14   Gildardo Cranker, DO  polyethylene glycol (MIRALAX / GLYCOLAX) packet Take 17 g by mouth daily. 02/24/14   Gerlene Fee, NP  Rivastigmine 13.3 MG/24HR PT24 Place 1 patch onto the skin every morning.    Historical Provider, MD  senna-docusate (SENOKOT-S) 8.6-50 MG per tablet Take 2 tablets by mouth at bedtime. 09/26/14   Gerlene Fee, NP  tamsulosin (FLOMAX) 0.4 MG CAPS Take 0.4 mg by mouth at bedtime.     Historical Provider, MD  traMADol (ULTRAM) 50 MG tablet Take two tablets by mouth every 6 hours for pain MAX 300 MG TRAMADOL DAILY Patient taking differently: Take 100 mg by mouth every 6 (six) hours as needed. MAX 300 MG TRAMADOL DAILY 09/24/14   Lauree Chandler, NP   BP 168/77 mmHg  Pulse 73  Temp(Src) 98.7 F (37.1 C)  Resp 19  SpO2 96% Physical Exam  Constitutional: He is oriented to person, place, and time. He appears well-developed and well-nourished.  HENT:  Head: Normocephalic and atraumatic.  Eyes: Conjunctivae and EOM are normal.  Neck: Normal range of motion. Neck supple.  Cardiovascular: Normal rate and regular rhythm.   Pulmonary/Chest: Effort normal. No respiratory distress.  Abdominal: Soft. There is no tenderness.  Musculoskeletal: Normal range of motion. He exhibits no edema or tenderness.  Neurological: He is alert and oriented to person, place, and time.  Skin: Skin is warm and dry.  Nursing note and vitals reviewed.   ED Course   Procedures (including critical care time) Labs Review Labs Reviewed - No data to display  Imaging Review No results found.   EKG Interpretation None      MDM   Final diagnoses:  None   81 year old male with worsening aggression delusions paranoia, hallucinations per the facility where he comes from. Patient is not suicidal or homicidal but possibly psychotic versus or related to his dementia since TTS was consult and he recommended placement. Patient will wait until placement was found.    Merrily Pew, MD 10/31/14 [phone]

## 2014-10-30 NOTE — ED Notes (Signed)
Bed: QW38 Expected date:  Expected time:  Means of arrival:  Comments: EMS- psych, confusion/agitation/delusions

## 2014-10-30 NOTE — ED Notes (Signed)
Patient here from [facility] with complaints of increase agitation x1 week. Patient is IVC'd. Hx of same. Recently diagnosed with UTI.

## 2014-10-31 DIAGNOSIS — F39 Unspecified mood [affective] disorder: Secondary | ICD-10-CM | POA: Diagnosis present

## 2014-10-31 DIAGNOSIS — F29 Unspecified psychosis not due to a substance or known physiological condition: Secondary | ICD-10-CM | POA: Diagnosis not present

## 2014-10-31 MED ORDER — SENNOSIDES-DOCUSATE SODIUM 8.6-50 MG PO TABS: 2.0000 | ORAL_TABLET | Freq: Every day | ORAL | Status: DC

## 2014-10-31 MED ORDER — ZOLPIDEM TARTRATE 5 MG PO TABS: 5.0000 mg | ORAL_TABLET | Freq: Every evening | ORAL | Status: DC | PRN

## 2014-10-31 MED ORDER — TRAMADOL HCL 50 MG PO TABS
100.0000 mg | ORAL_TABLET | Freq: Four times a day (QID) | ORAL | Status: DC
  Administered 2014-10-31 (×2): 100 mg via ORAL
  Filled 2014-10-31 (×2): qty 2

## 2014-10-31 MED ORDER — PANTOPRAZOLE SODIUM 40 MG PO TBEC
80.0000 mg | DELAYED_RELEASE_TABLET | Freq: Every day | ORAL | Status: DC
  Administered 2014-10-31: 80 mg via ORAL
  Filled 2014-10-31: qty 2

## 2014-10-31 MED ORDER — LORAZEPAM 0.5 MG PO TABS
0.5000 mg | ORAL_TABLET | Freq: Three times a day (TID) | ORAL | Status: DC | PRN
Start: 2014-10-31 — End: 2014-10-31

## 2014-10-31 MED ORDER — HALOPERIDOL LACTATE 5 MG/ML IJ SOLN: 2.5000 mg | Freq: Every day | INTRAMUSCULAR | Status: DC | PRN

## 2014-10-31 MED ORDER — POLYETHYLENE GLYCOL 3350 17 G PO PACK
17.0000 g | PACK | Freq: Every day | ORAL | Status: DC
  Administered 2014-10-31: 17 g via ORAL
  Filled 2014-10-31: qty 1

## 2014-10-31 MED ORDER — LEVOTHYROXINE SODIUM 88 MCG PO TABS
88.0000 ug | ORAL_TABLET | Freq: Every day | ORAL | Status: DC
  Filled 2014-10-31: qty 1

## 2014-10-31 MED ORDER — TAMSULOSIN HCL 0.4 MG PO CAPS: 0.4000 mg | ORAL_CAPSULE | Freq: Every day | ORAL | Status: DC

## 2014-10-31 MED ORDER — ESCITALOPRAM OXALATE 10 MG PO TABS: 10.0000 mg | ORAL_TABLET | Freq: Every day | ORAL | Status: DC

## 2014-10-31 MED ORDER — LAMOTRIGINE 25 MG PO TABS
125.0000 mg | ORAL_TABLET | Freq: Every evening | ORAL | Status: DC
  Filled 2014-10-31: qty 5

## 2014-10-31 MED ORDER — METHADONE HCL 5 MG PO TABS
15.0000 mg | ORAL_TABLET | Freq: Two times a day (BID) | ORAL | Status: DC
  Administered 2014-10-31: 15 mg via ORAL
  Filled 2014-10-31: qty 3

## 2014-10-31 MED ORDER — OXYCODONE HCL 5 MG PO TABS
10.0000 mg | ORAL_TABLET | Freq: Three times a day (TID) | ORAL | Status: DC
  Administered 2014-10-31 (×2): 10 mg via ORAL
  Filled 2014-10-31 (×2): qty 2

## 2014-10-31 MED ORDER — LINACLOTIDE 290 MCG PO CAPS
290.0000 ug | ORAL_CAPSULE | Freq: Every day | ORAL | Status: DC
  Administered 2014-10-31: 290 ug via ORAL
  Filled 2014-10-31: qty 1

## 2014-10-31 MED ORDER — LIDOCAINE 5 % EX PTCH
3.0000 | MEDICATED_PATCH | Freq: Every day | CUTANEOUS | Status: DC
  Administered 2014-10-31: 3 via TRANSDERMAL
  Filled 2014-10-31: qty 3

## 2014-10-31 MED ORDER — DUTASTERIDE 0.5 MG PO CAPS
0.5000 mg | ORAL_CAPSULE | Freq: Every day | ORAL | Status: DC
  Administered 2014-10-31: 0.5 mg via ORAL
  Filled 2014-10-31: qty 1

## 2014-10-31 MED ORDER — METHOCARBAMOL 500 MG PO TABS
500.0000 mg | ORAL_TABLET | Freq: Three times a day (TID) | ORAL | Status: DC
  Administered 2014-10-31 (×2): 500 mg via ORAL
  Filled 2014-10-31 (×2): qty 1

## 2014-10-31 MED ORDER — RIVASTIGMINE 13.3 MG/24HR TD PT24
1.0000 | MEDICATED_PATCH | Freq: Every morning | TRANSDERMAL | Status: DC
  Administered 2014-10-31: 13.3 mg via TRANSDERMAL
  Filled 2014-10-31: qty 1

## 2014-10-31 NOTE — ED Notes (Signed)
MD at bedside. 

## 2014-10-31 NOTE — BHH Counselor (Signed)
Referral faxed to:  [facility]  Redmond Pulling, [place] OBS Counselor

## 2014-10-31 NOTE — BH Assessment (Signed)
[place] Assessment Progress Note  At 10:50 I received a call from [place] at [facility].  Pt has been accepted to their facility by Geanie Kenning with a caveat that he is not to be transferred until after 14:00 or 15:00.  Please call report to [phone].  Corena Pilgrim, MD concurs with this decision.  Pt's nurse has been notified.  Pt is under IVC and is to be transported via [facility].  Jalene Mullet, [place] Triage Specialist [phone]

## 2014-10-31 NOTE — Consult Note (Signed)
[facility] Consult   Reason for Consult:  Unspecified mood disorder, Anxiety disorder, Dementia by hx Referring Physician:  EDP Patient Identification: George Foster MRN:  [id number] Principal Diagnosis: Mood disorder Diagnosis:   Patient Active Problem List   Diagnosis Date Noted  . Mood disorder [F39] 10/31/2014    Priority: High  . Hereditary and idiopathic peripheral neuropathy [G60.9] 07/01/2014  . Insomnia [G47.00] 04/19/2014  . Fall [W19.XXXA] 10/02/2013  . BPH (benign prostatic hyperplasia) [N40.0] 02/17/2013  . Osteoarthritis cervical spine [M47.812] 02/17/2013  . Chronic constipation [K59.00]   . Chronic back pain [M54.9, G89.29] 06/08/2011  . Dementia with behavioral disturbance [F03.91] 06/06/2011  . Hypertension [I10]   . Hypothyroidism [E03.9]   . Gastroesophageal reflux disease [K21.9]   . Anxiety disorder [F41.9]     Total Time spent with patient: 1 hour  Subjective:   George Foster is a 81 y.o. male patient admitted with Unspecified mood disorder, Anxiety disorder, Dementia by hx.  HPI:  Caucasian male, 82 years old was evaluated for agitation.  Patient was brought in from his Nursing home for agitation towards staff.  He was seen and released back to the nursing home on the 4 th of August for agitation.  Patient this time reported to the ER staff that he was seeing roaches under his bed and he heard them moving around.  He was calm and cooperative during our interview.  Patient vehemently denies wanting to kill himself or another person.  Patient repeatedly stated that his problem is his chronic pain issue.  Patient stated" I wish I can get relief from my pain.  He has been calm and cooperative since his arrival.  Patient has been accepted at [facility] and will be transferred as soon as transportation is here.  HPI Elements:   Location:  Unspecified mood disorder. Quality:  severe. Severity:  severe. Timing:  Acute. Duration:  Chronic  medical and mental illness. Context:  Seeking relief from back pain.  Past Medical History:  Past Medical History  Diagnosis Date  . Dementia   . Hypertension   . Arthritis   . Hypothyroidism   . Chronic back pain   . Anxiety disorder   . Gastroesophageal reflux disease   . Polypharmacy     History of hospitalizations for altered mental status related to medications  . Chronic constipation     with fecal impactions, recurrent  . Suicidal ideation 06/06/2011  . Traumatic subdural hematoma 10/01/2013  . Multiple facial fractures 10/02/2013  . Basilar skull fracture 10/02/2013  . Acute blood loss anemia 10/02/2013    Past Surgical History  Procedure Laterality Date  . Appendectomy    . Removal of nonrestorable teeth numbers 2, 4, 6, 12, 13, 18, 29     Family History:  Family History  Problem Relation Age of Onset  . Heart disease Father    Social History:  History  Alcohol Use No    Comment: Pt denies     History  Drug Use No    Comment: Pt denies    Social History   Social History  . Marital Status: Married    Spouse Name: Donia Guiles  . Number of Children: 5  . Years of Education: 12   Occupational History  . Retired Librarian, academic in a [place]  . Smoking status: Never Smoker   . Smokeless tobacco: Never Used  . Alcohol Use: No     Comment: Pt  denies  . Drug Use: No     Comment: Pt denies  . Sexual Activity: Not Asked   Other Topics Concern  . None   Social History Narrative   Married.  Lives in [place] with wife.  Ambulates with a walker.   Additional Social History:    Pain Medications: See PTA medication list Prescriptions: See PTA medication list Over the Counter: See PTA medication list History of alcohol / drug use?: No history of alcohol / drug abuse                     Allergies:  No Known Allergies  Labs:  Results for orders placed or performed during the hospital encounter of 10/30/14 (from the past 48  hour(s))  CBC WITH DIFFERENTIAL     Status: Abnormal   Collection Time: 10/30/14  4:27 PM  Result Value Ref Range   WBC 4.9 4.0 - 10.5 K/uL   RBC 3.61 (L) 4.22 - 5.81 MIL/uL   Hemoglobin 12.5 (L) 13.0 - 17.0 g/dL   HCT 37.4 (L) 39.0 - 52.0 %   MCV 103.6 (H) 78.0 - 100.0 fL   MCH 34.6 (H) 26.0 - 34.0 pg   MCHC 33.4 30.0 - 36.0 g/dL   RDW 13.3 11.5 - 15.5 %   Platelets 180 150 - 400 K/uL   Neutrophils Relative % 77 43 - 77 %   Neutro Abs 3.7 1.7 - 7.7 K/uL   Lymphocytes Relative 16 12 - 46 %   Lymphs Abs 0.8 0.7 - 4.0 K/uL   Monocytes Relative 5 3 - 12 %   Monocytes Absolute 0.3 0.1 - 1.0 K/uL   Eosinophils Relative 2 0 - 5 %   Eosinophils Absolute 0.1 0.0 - 0.7 K/uL   Basophils Relative 0 0 - 1 %   Basophils Absolute 0.0 0.0 - 0.1 K/uL  Comprehensive metabolic panel     Status: Abnormal   Collection Time: 10/30/14  4:27 PM  Result Value Ref Range   Sodium 138 135 - 145 mmol/L   Potassium 4.5 3.5 - 5.1 mmol/L   Chloride 105 101 - 111 mmol/L   CO2 27 22 - 32 mmol/L   Glucose, Bld 115 (H) 65 - 99 mg/dL   BUN 22 (H) 6 - 20 mg/dL   Creatinine, Ser 1.11 0.61 - 1.24 mg/dL   Calcium 8.8 (L) 8.9 - 10.3 mg/dL   Total Protein 6.5 6.5 - 8.1 g/dL   Albumin 4.0 3.5 - 5.0 g/dL   AST 18 15 - 41 U/L   ALT 11 (L) 17 - 63 U/L   Alkaline Phosphatase 66 38 - 126 U/L   Total Bilirubin 0.6 0.3 - 1.2 mg/dL   GFR calc non Af Amer 57 (L) >60 mL/min   GFR calc Af Amer >60 >60 mL/min    Comment: (NOTE) The eGFR has been calculated using the CKD EPI equation. This calculation has not been validated in all clinical situations. eGFR's persistently <60 mL/min signify possible Chronic Kidney Disease.    Anion gap 6 5 - 15  Ethanol     Status: None   Collection Time: 10/30/14  4:27 PM  Result Value Ref Range   Alcohol, Ethyl (B) <5 <5 mg/dL    Comment:        LOWEST DETECTABLE LIMIT FOR SERUM ALCOHOL IS 5 mg/dL FOR MEDICAL PURPOSES ONLY   Urine rapid drug screen (hosp performed)not at [facility]      Status: Abnormal  Collection Time: 10/30/14  4:38 PM  Result Value Ref Range   Opiates POSITIVE (A) NONE DETECTED   Cocaine NONE DETECTED NONE DETECTED   Benzodiazepines POSITIVE (A) NONE DETECTED   Amphetamines NONE DETECTED NONE DETECTED   Tetrahydrocannabinol NONE DETECTED NONE DETECTED   Barbiturates NONE DETECTED NONE DETECTED    Comment:        DRUG SCREEN FOR MEDICAL PURPOSES ONLY.  IF CONFIRMATION IS NEEDED FOR ANY PURPOSE, NOTIFY LAB WITHIN 5 DAYS.        LOWEST DETECTABLE LIMITS FOR URINE DRUG SCREEN Drug Class       Cutoff (ng/mL) Amphetamine      1000 Barbiturate      200 Benzodiazepine   409 Tricyclics       811 Opiates          300 Cocaine          300 THC              50   Urinalysis, Routine w reflex microscopic (not at [facility])     Status: Abnormal   Collection Time: 10/30/14  4:39 PM  Result Value Ref Range   Color, Urine AMBER (A) YELLOW    Comment: BIOCHEMICALS MAY BE AFFECTED BY COLOR   APPearance CLEAR CLEAR   Specific Gravity, Urine 1.021 1.005 - 1.030   pH 6.5 5.0 - 8.0   Glucose, UA NEGATIVE NEGATIVE mg/dL   Hgb urine dipstick NEGATIVE NEGATIVE   Bilirubin Urine NEGATIVE NEGATIVE   Ketones, ur NEGATIVE NEGATIVE mg/dL   Protein, ur NEGATIVE NEGATIVE mg/dL   Urobilinogen, UA 1.0 0.0 - 1.0 mg/dL   Nitrite NEGATIVE NEGATIVE   Leukocytes, UA NEGATIVE NEGATIVE    Comment: MICROSCOPIC NOT DONE ON URINES WITH NEGATIVE PROTEIN, BLOOD, LEUKOCYTES, NITRITE, OR GLUCOSE <1000 mg/dL.    Vitals: Blood pressure 153/72, pulse 62, temperature 98.5 F (36.9 C), temperature source Oral, resp. rate 16, SpO2 96 %.  Risk to Self: Suicidal Ideation: No-Not Currently/Within Last 6 Months Suicidal Intent: No Is patient at risk for suicide?: No Suicidal Plan?: No Access to Means: No What has been your use of drugs/alcohol within the last 12 months?: N/A How many times?: 0 Other Self Harm Risks: None Triggers for Past Attempts: None known Intentional Self Injurious  Behavior: None Risk to Others: Homicidal Ideation: No Thoughts of Harm to Others: No Current Homicidal Intent: No Current Homicidal Plan: No Access to Homicidal Means: No Identified Victim: No one History of harm to others?: No Assessment of Violence: None Noted Violent Behavior Description: None reported Does patient have access to weapons?: No Criminal Charges Pending?: No Does patient have a court date: No Prior Inpatient Therapy: Prior Inpatient Therapy: No Prior Therapy Dates: N/A Prior Therapy Facilty/Provider(s): N/A Reason for Treatment: N/A Prior Outpatient Therapy: Prior Outpatient Therapy: No Prior Therapy Dates: N/A Prior Therapy Facilty/Provider(s): N/A Reason for Treatment: N/A Does patient have an ACCT team?: No Does patient have Intensive In-House Services?  : No Does patient have [facility] services? : No Does patient have P4CC services?: No  Current Facility-Administered Medications  Medication Dose Route Frequency Provider Last Rate Last Dose  . dutasteride (AVODART) capsule 0.5 mg  0.5 mg Oral Daily Rolland Porter, MD   0.5 mg at 10/31/14 1047  . escitalopram (LEXAPRO) tablet 10 mg  10 mg Oral QHS Rolland Porter, MD      . haloperidol lactate (HALDOL) injection 2.5 mg  2.5 mg Intramuscular Daily PRN Rolland Porter, MD      .  lamoTRIgine (LAMICTAL) tablet 125 mg  125 mg Oral QPM Rolland Porter, MD      . levothyroxine (SYNTHROID, LEVOTHROID) tablet 88 mcg  88 mcg Oral QHS Rolland Porter, MD      . lidocaine (LIDODERM) 5 % 3 patch  3 patch Transdermal Daily Rolland Porter, MD   3 patch at 10/31/14 1048  . Linaclotide (LINZESS) capsule 290 mcg  290 mcg Oral Daily Rolland Porter, MD   290 mcg at 10/31/14 1048  . LORazepam (ATIVAN) tablet 0.5 mg  0.5 mg Oral Q8H PRN Rolland Porter, MD      . methadone (DOLOPHINE) tablet 15 mg  15 mg Oral Q12H Rolland Porter, MD   15 mg at 10/31/14 0845  . methocarbamol (ROBAXIN) tablet 500 mg  500 mg Oral 3 times per day Rolland Porter, MD   500 mg at 10/31/14 0843  . oxyCODONE (Oxy  IR/ROXICODONE) immediate release tablet 10 mg  10 mg Oral 3 times per day Rolland Porter, MD   10 mg at 10/31/14 0844  . pantoprazole (PROTONIX) EC tablet 80 mg  80 mg Oral Daily Rolland Porter, MD   80 mg at 10/31/14 0843  . polyethylene glycol (MIRALAX / GLYCOLAX) packet 17 g  17 g Oral Daily Rolland Porter, MD   17 g at 10/31/14 1048  . Rivastigmine PT24 13.3 mg  1 patch Transdermal q morning - 10a Rolland Porter, MD   13.3 mg at 10/31/14 1053  . senna-docusate (Senokot-S) tablet 2 tablet  2 tablet Oral QHS Rolland Porter, MD      . tamsulosin (FLOMAX) capsule 0.4 mg  0.4 mg Oral QHS Rolland Porter, MD      . traMADol Veatrice Bourbon) tablet 100 mg  100 mg Oral Q6H Rolland Porter, MD   100 mg at 10/31/14 0845  . zolpidem (AMBIEN) tablet 5 mg  5 mg Oral QHS PRN,MR X 1 Rolland Porter, MD       Current Outpatient Prescriptions  Medication Sig Dispense Refill  . docusate sodium (COLACE) 100 MG capsule Take 100 mg by mouth 2 (two) times daily.    Marland Kitchen dutasteride (AVODART) 0.5 MG capsule Take 0.5 mg by mouth every morning.     . escitalopram (LEXAPRO) 10 MG tablet Take 10 mg by mouth at bedtime.     . eszopiclone (LUNESTA) 2 MG TABS tablet Take 1 tablet (2 mg total) by mouth at bedtime. Take immediately before bedtime 90 tablet 5  . haloperidol lactate (HALDOL) 5 MG/ML injection Inject 2.5 mg into the muscle daily as needed (agitation/anxiety).    . LamoTRIgine 50 MG TBDP Take 125 mg by mouth every evening.     Marland Kitchen levothyroxine (SYNTHROID, LEVOTHROID) 88 MCG tablet Take 88 mcg by mouth at bedtime.    . lidocaine (LIDODERM) 5 % Place 3 patches onto the skin 2 (two) times daily. Apply 1 patch to left side twice daily related to chronic pain & 1 patch to lower back twice daily for pain. Also apply 1 patch to cervical spine-neck twice daily.    . Linaclotide (LINZESS) 290 MCG CAPS capsule Take 290 mcg by mouth every morning.     Marland Kitchen LORazepam (ATIVAN) 0.5 MG tablet Take one tablet by mouth every 8 hours as needed for anixety (Patient taking differently:  Take 0.5 mg by mouth every 8 (eight) hours as needed for anxiety. ) 90 tablet 5  . methadone (DOLOPHINE) 10 MG tablet Take 1 tablet (10 mg total) by mouth every 12 (twelve) hours. (Patient  taking differently: Take 15 mg by mouth every 12 (twelve) hours. ) 60 tablet 0  . methocarbamol (ROBAXIN) 500 MG tablet Take 500 mg by mouth every 8 (eight) hours.     . Multiple Vitamins-Minerals (MULTIVITAMINS THER. W/MINERALS) TABS Take 1 tablet by mouth daily.    Marland Kitchen omeprazole (PRILOSEC) 40 MG capsule Take 40 mg by mouth daily.    Marland Kitchen oxyCODONE (ROXICODONE) 5 MG immediate release tablet 2 tablets by mouth every 8 hours scheduled and 2 by mouth every 8 hours as needed for pain (Patient taking differently: Take 10 mg by mouth every 8 (eight) hours. Patient may have 5 mg every 4 hours as needed for pain, also.) 180 tablet 0  . polyethylene glycol (MIRALAX / GLYCOLAX) packet Take 17 g by mouth daily. 14 each 11  . Rivastigmine 13.3 MG/24HR PT24 Place 1 patch onto the skin every morning.    . senna-docusate (SENOKOT-S) 8.6-50 MG per tablet Take 2 tablets by mouth at bedtime. 90 tablet 11  . tamsulosin (FLOMAX) 0.4 MG CAPS Take 0.4 mg by mouth at bedtime.     . traMADol (ULTRAM) 50 MG tablet Take two tablets by mouth every 6 hours for pain MAX 300 MG TRAMADOL DAILY (Patient taking differently: Take 100 mg by mouth every 6 (six) hours. MAX 300 MG TRAMADOL DAILY) 240 tablet 5    Musculoskeletal: Strength & Muscle Tone: seen in bed sitting Gait & Station: seen in bed sitting Patient leans: see above  Psychiatric Specialty Exam: Physical Exam  Review of Systems  Constitutional: Negative.   HENT: Negative.   Eyes: Negative.   Respiratory: Negative.   Cardiovascular:       Hx of HTN  Gastrointestinal:       Hx of GERD  Genitourinary: Negative.        Hx of BPH  Musculoskeletal: Positive for back pain (Chronic back pain on medications.) and falls (HX of recent fall).  Skin: Negative.   Neurological:       Hx  of Dementia with behavior disturbance  Endo/Heme/Allergies:       Hx of Hypothroidism    Blood pressure 153/72, pulse 62, temperature 98.5 F (36.9 C), temperature source Oral, resp. rate 16, SpO2 96 %.There is no weight on file to calculate BMI.  General Appearance: Casual and Fairly Groomed  Eye Contact::  Good  Speech:  Clear and Coherent and Normal Rate  Volume:  Normal  Mood:  Angry, Anxious and Depressed  Affect:  Congruent and Depressed  Thought Process:  Coherent, Goal Directed and Intact  Orientation:  Full (Time, Place, and Person)  Thought Content:  WDL  Suicidal Thoughts:  No  Homicidal Thoughts:  No  Memory:  Immediate;   Fair Recent;   Poor Remote;   Poor  Judgement:  Poor  Insight:  Fair  Psychomotor Activity:  Psychomotor Retardation  Concentration:  Good  Recall:  Poor  Fund of Knowledge:Fair  Language: Good  Akathisia:  NA  Handed:  Right  AIMS (if indicated):     Assets:  Desire for Improvement  ADL's:  Intact  Cognition: Impaired,  Moderate  Sleep:      Medical Decision Making: Review of Psycho-Social Stressors (1)  Plan:  Transfer as soon as transportation is here to [place]. Disposition:   Accepted at [facility]  Delfin Gant   PMHNP-BC 10/31/2014 1:28 PM Patient seen face-to-face for psychiatric evaluation, chart reviewed and case discussed with the physician extender and developed treatment plan.  Reviewed the information documented and agree with the treatment plan. Corena Pilgrim, MD

## 2014-10-31 NOTE — ED Notes (Signed)
Bed: JO83 Expected date:  Expected time:  Means of arrival:  Comments: [place]

## 2014-11-19 ENCOUNTER — Encounter: Payer: Self-pay | Admitting: Adult Health

## 2014-11-19 ENCOUNTER — Non-Acute Institutional Stay (SKILLED_NURSING_FACILITY): Payer: Medicare Other | Admitting: Adult Health

## 2014-11-19 DIAGNOSIS — E039 Hypothyroidism, unspecified: Secondary | ICD-10-CM

## 2014-11-19 DIAGNOSIS — K219 Gastro-esophageal reflux disease without esophagitis: Secondary | ICD-10-CM | POA: Diagnosis not present

## 2014-11-19 DIAGNOSIS — M549 Dorsalgia, unspecified: Secondary | ICD-10-CM

## 2014-11-19 DIAGNOSIS — G8929 Other chronic pain: Secondary | ICD-10-CM | POA: Diagnosis not present

## 2014-11-19 DIAGNOSIS — I10 Essential (primary) hypertension: Secondary | ICD-10-CM

## 2014-11-19 DIAGNOSIS — K5900 Constipation, unspecified: Secondary | ICD-10-CM | POA: Diagnosis not present

## 2014-11-19 DIAGNOSIS — F0391 Unspecified dementia with behavioral disturbance: Secondary | ICD-10-CM | POA: Diagnosis not present

## 2014-11-19 DIAGNOSIS — N400 Enlarged prostate without lower urinary tract symptoms: Secondary | ICD-10-CM

## 2014-11-19 DIAGNOSIS — F29 Unspecified psychosis not due to a substance or known physiological condition: Secondary | ICD-10-CM | POA: Diagnosis not present

## 2014-11-19 DIAGNOSIS — K5909 Other constipation: Secondary | ICD-10-CM

## 2014-11-19 DIAGNOSIS — F03918 Unspecified dementia, unspecified severity, with other behavioral disturbance: Secondary | ICD-10-CM

## 2014-11-19 MED ORDER — METHADONE HCL 5 MG PO TABS: 5.0000 mg | ORAL_TABLET | Freq: Two times a day (BID) | ORAL | Status: DC

## 2014-11-19 NOTE — Progress Notes (Signed)
Patient ID: George Foster, male   DOB: 03/27/1924, 81 y.o.   MRN: [id number]   Facility: [name]      No Known Allergies  Chief Complaint  Patient presents with  . Hospitalization Follow-up    HPI:  He is a long term resident of this facility who had an involuntary hospitalization for acute psychosis. His medications were changed; his narcotics were reduced; as this was felt to contribute to his issues. He states that he is feeling better now. He is still having back and neck pain; but states he is getting adequate relief.     Past Medical History  Diagnosis Date  . Dementia   . Hypertension   . Arthritis   . Hypothyroidism   . Chronic back pain   . Anxiety disorder   . Gastroesophageal reflux disease   . Polypharmacy     History of hospitalizations for altered mental status related to medications  . Chronic constipation     with fecal impactions, recurrent  . Suicidal ideation 06/06/2011  . Traumatic subdural hematoma 10/01/2013  . Multiple facial fractures 10/02/2013  . Basilar skull fracture 10/02/2013  . Acute blood loss anemia 10/02/2013    Past Surgical History  Procedure Laterality Date  . Appendectomy    . Removal of nonrestorable teeth numbers 2, 4, 6, 12, 13, 18, 29      VITAL SIGNS BP 138/76 mmHg  Pulse 76  Ht 5\' 7"  (1.702 m)  Wt 176 lb (79.833 kg)  BMI 27.56 kg/m2  SpO2 97%  Patient's Medications  New Prescriptions   METHADONE (DOLOPHINE) 5 MG TABLET    Take 1 tablet (5 mg total) by mouth every 12 (twelve) hours.  Previous Medications   ARIPIPRAZOLE (ABILIFY) 30 MG TABLET    Take 30 mg by mouth daily.   CYANOCOBALAMIN 1000 MCG TABLET    Take 100 mcg by mouth daily.   avodart    Take 0. 5 mg by mouth daily.   GABAPENTIN (NEURONTIN) 800 MG TABLET    Take 800 mg by mouth at bedtime.   LEVOTHYROXINE (SYNTHROID, LEVOTHROID) 88 MCG TABLET    Take 88 mcg by mouth at bedtime.   LIDOCAINE (LIDODERM) 5 %    Place 3 patches onto the skin 2  (two) times daily. Apply 1 patch to left side twice daily related to chronic pain & 1 patch to lower back twice daily for pain. Also apply 1 patch to cervical spine-neck twice daily.   LINACLOTIDE (LINZESS) 290 MCG CAPS CAPSULE    Take 290 mcg by mouth every morning.    MIRTAZAPINE (REMERON) 15 MG TABLET    Take 15 mg by mouth at bedtime.   MULTIPLE VITAMINS-MINERALS (MULTIVITAMINS THER. W/MINERALS) TABS    Take 1 tablet by mouth daily.   POLYETHYLENE GLYCOL (MIRALAX / GLYCOLAX) PACKET    Take 17 g by mouth daily.   RIVASTIGMINE 13.3 MG/24HR PT24    Place 1 patch onto the skin every morning.   SENNA-DOCUSATE (SENOKOT-S) 8.6-50 MG PER TABLET    Take 2 tablets by mouth at bedtime.   TAMSULOSIN (FLOMAX) 0.4 MG CAPS    Take 0.4 mg by mouth at bedtime.   Modified Medications   No medications on file  Discontinued Medications     SIGNIFICANT DIAGNOSTIC EXAMS  01-30-14: ct of head and cervical spine: 1. Small vessel ischemic disease and brain atrophy. 2. No acute intracranial abnormalities. 3. Thoracic spondylosis noted.  10-24-14: chest x-ray: Mild scarring  left base. No edema or consolidation. No pneumothorax evident  10-24-14:lumbar spine x-ray: Multilevel osteoarthritic change. Scoliosis. No acute fracture or spondylolisthesis.  10-24-14 thoracic spine x-ray: No acute fracture or listhesis identified in the thoracic spine. Multilevel degenerative disease, moderate to severe.  10-24-14: ct of head and cervical spine:  CT HEAD:  Remote left zygomatic arch fracture.  No acute calvarial fracture. No intracranial hemorrhage. Small vessel disease type changes without CT evidence of large acute Infarct. Atrophy without hydrocephalus.   CT CERVICAL SPINE: No cervical spine fracture. Cervical spondylotic changes with spinal stenosis most notable C5-6 with mild cord compression greater on the left.  10-30-14: chest x-ray: Mild scarring left base.  No edema or consolidation.    LABS REVIEWED:    01-03-14; wbc 5.0; hgb 13.4; hct 43.0; mcv 110.2; plt 186; glucose 94; bun 19.5; creat 1.04; k+4.4 ;na++140; liver normal albumin 4.2; tsh 2.7 03-11-14: wbc 3.3; hgb 12.3; hct 37.6; mcv 108.4; plt 165; glucose 131; bun 35; creat 1.2; k+4.3; na++143;  Liver normal albumin 3.7  08-29-14: wbc 6.6 hgb 11.9; hct 38.3; mcv 104.2; plt 192; glucose 119; bun 23.1; creat 1.40; k+4.7; na++140; liver normal albumin 3.8 tsh 4.96  10-24-14: wbc 3.8; hgb 11.9; hct 35.9; mcv 100.8; plt 159; glucose 137; bun 25; creat 1.18; k+3.7; na++137 10-30-14: wbc 4.9; hgb 12.5; hct 37.4; mcv 103.6; plt 180; glucose 115; bun 22; creat 1.11; k+4.5; na++138; liver normal albumin 4.0  10-31-14:wbc 5.9; hgb 13.3; hct 40.6; mcv 106; plt 180 glucose 168; bun 23; creat 1.17; k+ 4.6; na++138 liver normal albumin 4.0; vit b12: 306; tsh 2.02;  vit d 42.5; hgb a1c 5.2 11-14-14: wbc 4.3; hgb 10.7; hct 34.5 ;mcv 107; plt 174; glucose 124; bun 24; creat 1.05; k+4.5; na++140; liver normal albumin 3.3     Review of Systems  Constitutional: Negative for weight loss and malaise/fatigue.    Eyes: Negative for blurred vision.  Respiratory: Negative for cough and shortness of breath.   Cardiovascular: Negative for chest pain, palpitations and leg swelling.  Gastrointestinal: Negative for heartburn, abdominal pain and constipation.  Genitourinary: Negative for dysuria.  Musculoskeletal: Positive for myalgias and back pain.       Has chronic back and neck pain; is currently managed   Skin: Negative.   Neurological: negative for headaches  Negative for dizziness.  Psychiatric/Behavioral: Negative for depression. The patient is not nervous/anxious.     Physical Exam Constitutional: He is oriented to person, place, and time. He appears well-developed and well-nourished. No distress.  Eyes: Pupils are equal, round, and reactive to light.  Neck: Neck supple. No JVD present. No thyromegaly present.  Cardiovascular: Normal rate, regular rhythm and  intact distal pulses.   Respiratory: Effort normal and breath sounds normal. No respiratory distress.  GI: Soft. Bowel sounds are normal. He exhibits no distension. There is no tenderness.  Musculoskeletal: He exhibits no edema.  Is able to move all extremities Has limited range of motion in neck Has mild tenderness to palpation to lower back   Neurological: He is alert and oriented to person, place, and time.  Skin: Skin is warm and dry. He is not diaphoretic.  Psychiatric: He has a normal mood and affect.   .    ASSESSMENT/ PLAN:  1. Chronic back pain: with cervical osteoarthritis: his pain is presently being managed on his current regimen of: methadone 5 mg twice daily; lidoderm patch to cervical spine and left lower back and left side;  His other narcotics  were stopped while he was hospitalized  Will continue to monitor his status.   2. BPH without obstruction: will continue avodart 0.5 mg daily and flomax 0.4 mg daily   3. Hypothyroidism: will continue synthroid 88 mcg daily his tsh is 2.02   4. GERD; will continue prilosec 20 mg daily   5. Chronic constipation: related to narcotic use: will continue colace twice daily; linzess 290 mcg daily; miralax 17 gm daily; and senna s 2 tabs nightly   6. Dementia: is without change in status; will continue exelon patch 13.3 mg daily   7. Psychosis with anxiety: will continue abilify 30 mg daily  And remeron 15 mg nightly to help with his sleep will monitor his status.   8. Hypertension: is stable is presently not on medications; will not make changes and will monitor his status.     Time spent with patient  50   minutes >50% time spent counseling; reviewing medical record; tests; labs; and developing future plan of care    Ok Edwards NP [facility]  Contact [phone] Monday through Friday 8am- 5pm  After hours call [phone]

## 2014-11-28 ENCOUNTER — Non-Acute Institutional Stay (SKILLED_NURSING_FACILITY): Payer: Medicare Other | Admitting: Internal Medicine

## 2014-11-28 DIAGNOSIS — M549 Dorsalgia, unspecified: Secondary | ICD-10-CM

## 2014-11-28 DIAGNOSIS — K5909 Other constipation: Secondary | ICD-10-CM

## 2014-11-28 DIAGNOSIS — G894 Chronic pain syndrome: Secondary | ICD-10-CM

## 2014-11-28 DIAGNOSIS — F411 Generalized anxiety disorder: Secondary | ICD-10-CM | POA: Diagnosis not present

## 2014-11-28 DIAGNOSIS — I10 Essential (primary) hypertension: Secondary | ICD-10-CM | POA: Diagnosis not present

## 2014-11-28 DIAGNOSIS — F333 Major depressive disorder, recurrent, severe with psychotic symptoms: Secondary | ICD-10-CM | POA: Diagnosis not present

## 2014-11-28 DIAGNOSIS — E039 Hypothyroidism, unspecified: Secondary | ICD-10-CM

## 2014-11-28 DIAGNOSIS — G8929 Other chronic pain: Secondary | ICD-10-CM | POA: Diagnosis not present

## 2014-11-28 DIAGNOSIS — G609 Hereditary and idiopathic neuropathy, unspecified: Secondary | ICD-10-CM

## 2014-11-28 DIAGNOSIS — F03918 Unspecified dementia, unspecified severity, with other behavioral disturbance: Secondary | ICD-10-CM

## 2014-11-28 DIAGNOSIS — K5900 Constipation, unspecified: Secondary | ICD-10-CM

## 2014-11-28 DIAGNOSIS — F0391 Unspecified dementia with behavioral disturbance: Secondary | ICD-10-CM | POA: Diagnosis not present

## 2014-12-03 ENCOUNTER — Encounter: Payer: Self-pay | Admitting: Internal Medicine

## 2014-12-03 DIAGNOSIS — F339 Major depressive disorder, recurrent, unspecified: Secondary | ICD-10-CM | POA: Insufficient documentation

## 2014-12-03 NOTE — Progress Notes (Signed)
Patient ID: George Foster, male   DOB: 1924-12-09, 81 y.o.   MRN: [id number]    HISTORY AND PHYSICAL   DATE: 11/28/14  Location:  [place] of Service: SNF [phone])   Extended Emergency Contact Information Primary Emergency Contact: Andrews,Shirley Address: [address]          [place], [place] [address] [place] Phone: [phone] Work Phone: [phone] Relation: Legal Guardian Secondary Emergency Contact: Trinda Pascal Address: [address]          [place],  [address] Johnnette Litter of [place] Phone: [phone] Mobile Phone: [phone] Relation: Spouse  Advanced Directive information  [place]  Chief Complaint  Patient presents with  . Readmit To SNF    HPI:  81 yo male seen today as a readmission into SNF following behavioral unit hospitalization for mood d/o, GAD, MDD and dementia. He spent several weeks at [facility] geriatric unit. meds adjusted for pain during admission. He c/o of generalized pain especially in neck and back. He would like to see a chiropractor as he has gotten relief in the past with back/neck adjustments. He c/o paresthesias in hands and feet. No loss of bowel/bladder control  Hypothyroid - stable on levothyroxine. No palpitations  HTN - stable off meds. Diet controlled  Chronic pain - uncontrolled on lidocaine patch, gabapentin and methadone  BPH - sx's stable on flomax and avodart  Dementia - stable on exelon patch.   Mood d/o - stable on remeron and abilify  Constipation - stable on linzess, miralax and senna   Past Medical History  Diagnosis Date  . Dementia   . Hypertension   . Arthritis   . Hypothyroidism   . Chronic back pain   . Anxiety disorder   . Gastroesophageal reflux disease   . Polypharmacy     History of hospitalizations for altered mental status related to medications  . Chronic constipation     with fecal impactions, recurrent  . Suicidal ideation 06/06/2011    . Traumatic subdural hematoma 10/01/2013  . Multiple facial fractures 10/02/2013  . Basilar skull fracture 10/02/2013  . Acute blood loss anemia 10/02/2013    Past Surgical History  Procedure Laterality Date  . Appendectomy    . Removal of nonrestorable teeth numbers 2, 4, 6, 12, 13, 18, 29      Patient Care Team: Gildardo Cranker, DO as PCP - General (Internal Medicine) Gerlene Fee, NP as Nurse Practitioner (Nurse Practitioner)  Social History   Social History  . Marital Status: Married    Spouse Name: Donia Guiles  . Number of Children: 5  . Years of Education: 12   Occupational History  . Retired Librarian, academic in a [employer]  . Smoking status: Never Smoker   . Smokeless tobacco: Never Used  . Alcohol Use: No     Comment: Pt denies  . Drug Use: No     Comment: Pt denies  . Sexual Activity: Not on file   Other Topics Concern  . Not on file   Social History Narrative   Married.  Lives in [place] with wife.  Ambulates with a walker.     reports that he has never smoked. He has never used smokeless tobacco. He reports that he does not drink alcohol or use illicit drugs.  Family History  Problem Relation Age of Onset  . Heart disease Father    Family Status  Relation Status Death Age  . Mother Deceased 66's    Old age  . Father Deceased     Heart disease  . Sister Alive   . Brother Marketing executive History  Administered Date(s) Administered  . Influenza-Unspecified 10/08/2013  . PPD Test 10/07/2013  . Tdap 09/27/2013    No Known Allergies  Medications: Patient's Medications  New Prescriptions   No medications on file  Previous Medications   ARIPIPRAZOLE (ABILIFY) 30 MG TABLET    Take 30 mg by mouth daily.   CYANOCOBALAMIN 1000 MCG TABLET    Take 100 mcg by mouth daily.   DUTASTERIDE (AVODART) 0.5 MG CAPSULE    Take 0.5 mg by mouth daily.   GABAPENTIN (NEURONTIN) 800 MG TABLET    Take 800 mg by mouth at bedtime.    LEVOTHYROXINE (SYNTHROID, LEVOTHROID) 88 MCG TABLET    Take 88 mcg by mouth at bedtime.   LIDOCAINE (LIDODERM) 5 %    Place 3 patches onto the skin 2 (two) times daily. Apply 1 patch to left side twice daily related to chronic pain & 1 patch to lower back twice daily for pain. Also apply 1 patch to cervical spine-neck twice daily.   LINACLOTIDE (LINZESS) 290 MCG CAPS CAPSULE    Take 290 mcg by mouth every morning.    METHADONE (DOLOPHINE) 5 MG TABLET    Take 1 tablet (5 mg total) by mouth every 12 (twelve) hours.   MIRTAZAPINE (REMERON) 15 MG TABLET    Take 15 mg by mouth at bedtime.   MULTIPLE VITAMINS-MINERALS (MULTIVITAMINS THER. W/MINERALS) TABS    Take 1 tablet by mouth daily.   POLYETHYLENE GLYCOL (MIRALAX / GLYCOLAX) PACKET    Take 17 g by mouth daily.   RIVASTIGMINE 13.3 MG/24HR PT24    Place 1 patch onto the skin every morning.   SENNA-DOCUSATE (SENOKOT-S) 8.6-50 MG PER TABLET    Take 2 tablets by mouth at bedtime.   TAMSULOSIN (FLOMAX) 0.4 MG CAPS    Take 0.4 mg by mouth at bedtime.   Modified Medications   No medications on file  Discontinued Medications   No medications on file    Review of Systems  Unable to perform ROS: Dementia    Filed Vitals:   11/28/14 1422  BP: 130/74  Pulse: 68  Temp: 97 F (36.1 C)  Weight: 176 lb (79.833 kg)  SpO2: 98%   Body mass index is 27.56 kg/(m^2).  Physical Exam  Constitutional: He appears well-developed. No distress.  Frail appearing. Sitting in w/c  HENT:  Mouth/Throat: Oropharynx is clear and moist.  Eyes: Pupils are equal, round, and reactive to light. No scleral icterus.  Neck: Neck supple. Muscular tenderness present. No spinous process tenderness present. Carotid bruit is not present. No rigidity. Decreased range of motion present. No thyromegaly present.    Cardiovascular: Normal rate, regular rhythm, normal heart sounds and intact distal pulses.  Exam reveals no gallop and no friction rub.   No murmur heard. no distal  LE swelling. No calf TTP  Pulmonary/Chest: Effort normal and breath sounds normal. He has no wheezes. He has no rales. He exhibits no tenderness.  Abdominal: Soft. Bowel sounds are normal. He exhibits no distension, no abdominal bruit, no pulsatile midline mass and no mass. There is no tenderness. There is no rebound and no guarding.  Musculoskeletal: He exhibits edema and tenderness.       Back:  Lymphadenopathy:    He has no  cervical adenopathy.  Neurological: He is alert.  Skin: Skin is warm and dry. No rash noted.  Psychiatric: He has a normal mood and affect. His behavior is normal.     Labs reviewed: Admission on 10/30/2014, Discharged on 10/31/2014  Component Date Value Ref Range Status  . WBC 10/30/2014 4.9  4.0 - 10.5 K/uL Final  . RBC 10/30/2014 3.61* 4.22 - 5.81 MIL/uL Final  . Hemoglobin 10/30/2014 12.5* 13.0 - 17.0 g/dL Final  . HCT 10/30/2014 37.4* 39.0 - 52.0 % Final  . MCV 10/30/2014 103.6* 78.0 - 100.0 fL Final  . MCH 10/30/2014 34.6* 26.0 - 34.0 pg Final  . MCHC 10/30/2014 33.4  30.0 - 36.0 g/dL Final  . RDW 10/30/2014 13.3  11.5 - 15.5 % Final  . Platelets 10/30/2014 180  150 - 400 K/uL Final  . Neutrophils Relative % 10/30/2014 77  43 - 77 % Final  . Neutro Abs 10/30/2014 3.7  1.7 - 7.7 K/uL Final  . Lymphocytes Relative 10/30/2014 16  12 - 46 % Final  . Lymphs Abs 10/30/2014 0.8  0.7 - 4.0 K/uL Final  . Monocytes Relative 10/30/2014 5  3 - 12 % Final  . Monocytes Absolute 10/30/2014 0.3  0.1 - 1.0 K/uL Final  . Eosinophils Relative 10/30/2014 2  0 - 5 % Final  . Eosinophils Absolute 10/30/2014 0.1  0.0 - 0.7 K/uL Final  . Basophils Relative 10/30/2014 0  0 - 1 % Final  . Basophils Absolute 10/30/2014 0.0  0.0 - 0.1 K/uL Final  . Sodium 10/30/2014 138  135 - 145 mmol/L Final  . Potassium 10/30/2014 4.5  3.5 - 5.1 mmol/L Final  . Chloride 10/30/2014 105  101 - 111 mmol/L Final  . CO2 10/30/2014 27  22 - 32 mmol/L Final  . Glucose, Bld 10/30/2014 115* 65 - 99  mg/dL Final  . BUN 10/30/2014 22* 6 - 20 mg/dL Final  . Creatinine, Ser 10/30/2014 1.11  0.61 - 1.24 mg/dL Final  . Calcium 10/30/2014 8.8* 8.9 - 10.3 mg/dL Final  . Total Protein 10/30/2014 6.5  6.5 - 8.1 g/dL Final  . Albumin 10/30/2014 4.0  3.5 - 5.0 g/dL Final  . AST 10/30/2014 18  15 - 41 U/L Final  . ALT 10/30/2014 11* 17 - 63 U/L Final  . Alkaline Phosphatase 10/30/2014 66  38 - 126 U/L Final  . Total Bilirubin 10/30/2014 0.6  0.3 - 1.2 mg/dL Final  . GFR calc non Af Amer 10/30/2014 57* >60 mL/min Final  . GFR calc Af Amer 10/30/2014 >60  >60 mL/min Final   Comment: (NOTE) The eGFR has been calculated using the CKD EPI equation. This calculation has not been validated in all clinical situations. eGFR's persistently <60 mL/min signify possible Chronic Kidney Disease.   . Anion gap 10/30/2014 6  5 - 15 Final  . Opiates 10/30/2014 POSITIVE* NONE DETECTED Final  . Cocaine 10/30/2014 NONE DETECTED  NONE DETECTED Final  . Benzodiazepines 10/30/2014 POSITIVE* NONE DETECTED Final  . Amphetamines 10/30/2014 NONE DETECTED  NONE DETECTED Final  . Tetrahydrocannabinol 10/30/2014 NONE DETECTED  NONE DETECTED Final  . Barbiturates 10/30/2014 NONE DETECTED  NONE DETECTED Final   Comment:        DRUG SCREEN FOR MEDICAL PURPOSES ONLY.  IF CONFIRMATION IS NEEDED FOR ANY PURPOSE, NOTIFY LAB WITHIN 5 DAYS.        LOWEST DETECTABLE LIMITS FOR URINE DRUG SCREEN Drug Class       Cutoff (ng/mL) Amphetamine  1000 Barbiturate      200 Benzodiazepine   161 Tricyclics       096 Opiates          300 Cocaine          300 THC              50   . Alcohol, Ethyl (B) 10/30/2014 <5  <5 mg/dL Final   Comment:        LOWEST DETECTABLE LIMIT FOR SERUM ALCOHOL IS 5 mg/dL FOR MEDICAL PURPOSES ONLY   . Color, Urine 10/30/2014 AMBER* YELLOW Final   BIOCHEMICALS MAY BE AFFECTED BY COLOR  . APPearance 10/30/2014 CLEAR  CLEAR Final  . Specific Gravity, Urine 10/30/2014 1.021  1.005 - 1.030 Final  .  pH 10/30/2014 6.5  5.0 - 8.0 Final  . Glucose, UA 10/30/2014 NEGATIVE  NEGATIVE mg/dL Final  . Hgb urine dipstick 10/30/2014 NEGATIVE  NEGATIVE Final  . Bilirubin Urine 10/30/2014 NEGATIVE  NEGATIVE Final  . Ketones, ur 10/30/2014 NEGATIVE  NEGATIVE mg/dL Final  . Protein, ur 10/30/2014 NEGATIVE  NEGATIVE mg/dL Final  . Urobilinogen, UA 10/30/2014 1.0  0.0 - 1.0 mg/dL Final  . Nitrite 10/30/2014 NEGATIVE  NEGATIVE Final  . Leukocytes, UA 10/30/2014 NEGATIVE  NEGATIVE Final   MICROSCOPIC NOT DONE ON URINES WITH NEGATIVE PROTEIN, BLOOD, LEUKOCYTES, NITRITE, OR GLUCOSE <1000 mg/dL.  Admission on 10/24/2014, Discharged on 10/25/2014  Component Date Value Ref Range Status  . WBC 10/24/2014 3.8* 4.0 - 10.5 K/uL Final  . RBC 10/24/2014 3.56* 4.22 - 5.81 MIL/uL Final  . Hemoglobin 10/24/2014 11.9* 13.0 - 17.0 g/dL Final  . HCT 10/24/2014 35.9* 39.0 - 52.0 % Final  . MCV 10/24/2014 100.8* 78.0 - 100.0 fL Final  . MCH 10/24/2014 33.4  26.0 - 34.0 pg Final  . MCHC 10/24/2014 33.1  30.0 - 36.0 g/dL Final  . RDW 10/24/2014 13.0  11.5 - 15.5 % Final  . Platelets 10/24/2014 159  150 - 400 K/uL Final  . Neutrophils Relative % 10/24/2014 66  43 - 77 % Final  . Neutro Abs 10/24/2014 2.5  1.7 - 7.7 K/uL Final  . Lymphocytes Relative 10/24/2014 26  12 - 46 % Final  . Lymphs Abs 10/24/2014 1.0  0.7 - 4.0 K/uL Final  . Monocytes Relative 10/24/2014 6  3 - 12 % Final  . Monocytes Absolute 10/24/2014 0.2  0.1 - 1.0 K/uL Final  . Eosinophils Relative 10/24/2014 2  0 - 5 % Final  . Eosinophils Absolute 10/24/2014 0.1  0.0 - 0.7 K/uL Final  . Basophils Relative 10/24/2014 0  0 - 1 % Final  . Basophils Absolute 10/24/2014 0.0  0.0 - 0.1 K/uL Final  . Sodium 10/24/2014 137  135 - 145 mmol/L Final  . Potassium 10/24/2014 3.7  3.5 - 5.1 mmol/L Final  . Chloride 10/24/2014 107  101 - 111 mmol/L Final  . CO2 10/24/2014 25  22 - 32 mmol/L Final  . Glucose, Bld 10/24/2014 137* 65 - 99 mg/dL Final  . BUN 10/24/2014  25* 6 - 20 mg/dL Final  . Creatinine, Ser 10/24/2014 1.18  0.61 - 1.24 mg/dL Final  . Calcium 10/24/2014 8.6* 8.9 - 10.3 mg/dL Final  . GFR calc non Af Amer 10/24/2014 53* >60 mL/min Final  . GFR calc Af Amer 10/24/2014 >60  >60 mL/min Final   Comment: (NOTE) The eGFR has been calculated using the CKD EPI equation. This calculation has not been validated in all clinical situations.  eGFR's persistently <60 mL/min signify possible Chronic Kidney Disease.   . Anion gap 10/24/2014 5  5 - 15 Final  . Color, Urine 10/24/2014 YELLOW  YELLOW Final  . APPearance 10/24/2014 CLEAR  CLEAR Final  . Specific Gravity, Urine 10/24/2014 1.020  1.005 - 1.030 Final  . pH 10/24/2014 5.5  5.0 - 8.0 Final  . Glucose, UA 10/24/2014 NEGATIVE  NEGATIVE mg/dL Final  . Hgb urine dipstick 10/24/2014 NEGATIVE  NEGATIVE Final  . Bilirubin Urine 10/24/2014 NEGATIVE  NEGATIVE Final  . Ketones, ur 10/24/2014 NEGATIVE  NEGATIVE mg/dL Final  . Protein, ur 10/24/2014 NEGATIVE  NEGATIVE mg/dL Final  . Urobilinogen, UA 10/24/2014 0.2  0.0 - 1.0 mg/dL Final  . Nitrite 10/24/2014 NEGATIVE  NEGATIVE Final  . Leukocytes, UA 10/24/2014 NEGATIVE  NEGATIVE Final   MICROSCOPIC NOT DONE ON URINES WITH NEGATIVE PROTEIN, BLOOD, LEUKOCYTES, NITRITE, OR GLUCOSE <1000 mg/dL.    No results found.   Assessment/Plan   ICD-9-CM ICD-10-CM   1. Hereditary and idiopathic peripheral neuropathy - uncontrolled 356.9 G60.9   2. Chronic pain syndrome - uncontrolled 338.4 G89.4   3. Chronic back pain - uncontrolled 724.5 M54.9    338.29 G89.29   4. Generalized anxiety disorder - stable 300.02 F41.1   5. Severe recurrent major depressive disorder with psychotic features - stable 296.34 F33.3   6. Dementia with behavioral disturbance - stable 294.21 F03.91   7. Chronic constipation - stable 564.00 K59.00   8. Essential hypertension, benign -controlled 401.1 I10   9. Hypothyroidism, unspecified hypothyroidism type - stable 244.9 E03.9      --increase gabapentin 831m in AM and qHS  --refer to chiropractor for chronic neck and back pain  --cont other meds as ordered  --PT/OT/ST as ordered  --GOAL: short term rehab then continue long term care. Communicated with pt and nursing.  --Will follow  Loris Winrow S. CPerlie Gold PQuincy Medical Centerand Adult Medicine [address]GPom[place] [address]([phone] (Monday-Friday 8 AM - 5 PM) [phone] 5 PM and follow prompts

## 2014-12-23 ENCOUNTER — Other Ambulatory Visit: Payer: Self-pay | Admitting: *Deleted

## 2014-12-23 MED ORDER — OXYCODONE-ACETAMINOPHEN 5-325 MG PO TABS: ORAL_TABLET | ORAL | Status: DC

## 2014-12-23 MED ORDER — OXYCODONE HCL 5 MG PO TABS: ORAL_TABLET | ORAL | Status: DC

## 2014-12-23 NOTE — Telephone Encounter (Signed)
[facility] 

## 2014-12-25 ENCOUNTER — Non-Acute Institutional Stay (SKILLED_NURSING_FACILITY): Payer: Medicare Other | Admitting: Adult Health

## 2014-12-25 DIAGNOSIS — G609 Hereditary and idiopathic neuropathy, unspecified: Secondary | ICD-10-CM | POA: Diagnosis not present

## 2014-12-25 DIAGNOSIS — M47812 Spondylosis without myelopathy or radiculopathy, cervical region: Secondary | ICD-10-CM

## 2014-12-25 DIAGNOSIS — F333 Major depressive disorder, recurrent, severe with psychotic symptoms: Secondary | ICD-10-CM | POA: Diagnosis not present

## 2014-12-25 DIAGNOSIS — F03918 Unspecified dementia, unspecified severity, with other behavioral disturbance: Secondary | ICD-10-CM

## 2014-12-25 DIAGNOSIS — I10 Essential (primary) hypertension: Secondary | ICD-10-CM

## 2014-12-25 DIAGNOSIS — K5900 Constipation, unspecified: Secondary | ICD-10-CM

## 2014-12-25 DIAGNOSIS — K5909 Other constipation: Secondary | ICD-10-CM

## 2014-12-25 DIAGNOSIS — K219 Gastro-esophageal reflux disease without esophagitis: Secondary | ICD-10-CM

## 2014-12-25 DIAGNOSIS — F0391 Unspecified dementia with behavioral disturbance: Secondary | ICD-10-CM

## 2014-12-25 DIAGNOSIS — E039 Hypothyroidism, unspecified: Secondary | ICD-10-CM | POA: Diagnosis not present

## 2015-01-08 ENCOUNTER — Other Ambulatory Visit: Payer: Self-pay | Admitting: *Deleted

## 2015-01-08 DIAGNOSIS — G8929 Other chronic pain: Secondary | ICD-10-CM

## 2015-01-08 DIAGNOSIS — M549 Dorsalgia, unspecified: Principal | ICD-10-CM

## 2015-01-08 MED ORDER — METHADONE HCL 5 MG PO TABS: 5.0000 mg | ORAL_TABLET | Freq: Two times a day (BID) | ORAL | Status: DC

## 2015-01-09 ENCOUNTER — Encounter: Payer: Self-pay | Admitting: Adult Health

## 2015-01-09 ENCOUNTER — Encounter (HOSPITAL_COMMUNITY): Payer: Self-pay

## 2015-01-09 ENCOUNTER — Emergency Department (HOSPITAL_COMMUNITY)
Admission: EM | Admit: 2015-01-09 | Discharge: 2015-01-09 | Disposition: A | Discharge status: Home / Self Care | Payer: Medicare Other | Attending: Emergency Medicine | Admitting: Emergency Medicine

## 2015-01-09 ENCOUNTER — Non-Acute Institutional Stay (SKILLED_NURSING_FACILITY): Payer: Medicare Other | Admitting: Adult Health

## 2015-01-09 DIAGNOSIS — Z8739 Personal history of other diseases of the musculoskeletal system and connective tissue: Secondary | ICD-10-CM | POA: Diagnosis not present

## 2015-01-09 DIAGNOSIS — Z79891 Long term (current) use of opiate analgesic: Secondary | ICD-10-CM | POA: Diagnosis not present

## 2015-01-09 DIAGNOSIS — Y9389 Activity, other specified: Secondary | ICD-10-CM | POA: Diagnosis not present

## 2015-01-09 DIAGNOSIS — K219 Gastro-esophageal reflux disease without esophagitis: Secondary | ICD-10-CM | POA: Insufficient documentation

## 2015-01-09 DIAGNOSIS — Z87828 Personal history of other (healed) physical injury and trauma: Secondary | ICD-10-CM | POA: Diagnosis not present

## 2015-01-09 DIAGNOSIS — K5900 Constipation, unspecified: Secondary | ICD-10-CM | POA: Diagnosis not present

## 2015-01-09 DIAGNOSIS — G8929 Other chronic pain: Secondary | ICD-10-CM | POA: Insufficient documentation

## 2015-01-09 DIAGNOSIS — D62 Acute posthemorrhagic anemia: Secondary | ICD-10-CM | POA: Insufficient documentation

## 2015-01-09 DIAGNOSIS — T7840XA Allergy, unspecified, initial encounter: Secondary | ICD-10-CM

## 2015-01-09 DIAGNOSIS — N400 Enlarged prostate without lower urinary tract symptoms: Secondary | ICD-10-CM | POA: Insufficient documentation

## 2015-01-09 DIAGNOSIS — F419 Anxiety disorder, unspecified: Secondary | ICD-10-CM | POA: Insufficient documentation

## 2015-01-09 DIAGNOSIS — Z79899 Other long term (current) drug therapy: Secondary | ICD-10-CM | POA: Insufficient documentation

## 2015-01-09 DIAGNOSIS — Z8781 Personal history of (healed) traumatic fracture: Secondary | ICD-10-CM | POA: Insufficient documentation

## 2015-01-09 DIAGNOSIS — H05223 Edema of bilateral orbit: Secondary | ICD-10-CM | POA: Diagnosis not present

## 2015-01-09 DIAGNOSIS — I10 Essential (primary) hypertension: Secondary | ICD-10-CM | POA: Insufficient documentation

## 2015-01-09 DIAGNOSIS — G629 Polyneuropathy, unspecified: Secondary | ICD-10-CM | POA: Diagnosis not present

## 2015-01-09 DIAGNOSIS — F0390 Unspecified dementia without behavioral disturbance: Secondary | ICD-10-CM | POA: Insufficient documentation

## 2015-01-09 DIAGNOSIS — E039 Hypothyroidism, unspecified: Secondary | ICD-10-CM | POA: Diagnosis not present

## 2015-01-09 DIAGNOSIS — F329 Major depressive disorder, single episode, unspecified: Secondary | ICD-10-CM | POA: Insufficient documentation

## 2015-01-09 DIAGNOSIS — X58XXXA Exposure to other specified factors, initial encounter: Secondary | ICD-10-CM | POA: Diagnosis not present

## 2015-01-09 DIAGNOSIS — T783XXA Angioneurotic edema, initial encounter: Secondary | ICD-10-CM | POA: Diagnosis not present

## 2015-01-09 DIAGNOSIS — Y9289 Other specified places as the place of occurrence of the external cause: Secondary | ICD-10-CM | POA: Insufficient documentation

## 2015-01-09 DIAGNOSIS — R21 Rash and other nonspecific skin eruption: Secondary | ICD-10-CM | POA: Insufficient documentation

## 2015-01-09 HISTORY — DX: Tremor, unspecified: R25.1

## 2015-01-09 HISTORY — DX: Depression, unspecified: F32.A

## 2015-01-09 HISTORY — DX: Major depressive disorder, single episode, unspecified: F32.9

## 2015-01-09 HISTORY — DX: Benign prostatic hyperplasia without lower urinary tract symptoms: N40.0

## 2015-01-09 HISTORY — DX: Unspecified mood (affective) disorder: F39

## 2015-01-09 HISTORY — DX: Polyneuropathy, unspecified: G62.9

## 2015-01-09 HISTORY — DX: Unspecified osteoarthritis, unspecified site: M19.90

## 2015-01-09 LAB — CBC WITH DIFFERENTIAL/PLATELET
BASOS ABS: 0 10*3/uL (ref 0.0–0.1)
Basophils Relative: 0 %
Eosinophils Absolute: 0.2 10*3/uL (ref 0.0–0.7)
Eosinophils Relative: 4 %
HEMATOCRIT: 35.3 % — AB (ref 39.0–52.0)
Hemoglobin: 11.7 g/dL — ABNORMAL LOW (ref 13.0–17.0)
LYMPHS ABS: 0.8 10*3/uL (ref 0.7–4.0)
LYMPHS PCT: 15 %
MCH: 33.9 pg (ref 26.0–34.0)
MCHC: 33.1 g/dL (ref 30.0–36.0)
MCV: 102.3 fL — AB (ref 78.0–100.0)
Monocytes Absolute: 0.3 10*3/uL (ref 0.1–1.0)
Monocytes Relative: 6 %
NEUTROS ABS: 3.9 10*3/uL (ref 1.7–7.7)
Neutrophils Relative %: 75 %
Platelets: 143 10*3/uL — ABNORMAL LOW (ref 150–400)
RBC: 3.45 MIL/uL — AB (ref 4.22–5.81)
RDW: 13.5 % (ref 11.5–15.5)
WBC: 5.2 10*3/uL (ref 4.0–10.5)

## 2015-01-09 LAB — BASIC METABOLIC PANEL
Anion gap: 6 (ref 5–15)
BUN: 22 mg/dL — ABNORMAL HIGH (ref 6–20)
CO2: 25 mmol/L (ref 22–32)
Calcium: 8.4 mg/dL — ABNORMAL LOW (ref 8.9–10.3)
Chloride: 111 mmol/L (ref 101–111)
Creatinine, Ser: 1.08 mg/dL (ref 0.61–1.24)
GFR calc Af Amer: >60 mL/min (ref >60)
GFR calc non Af Amer: 59 mL/min — ABNORMAL LOW (ref >60)
GLUCOSE: 141 mg/dL — AB (ref 65–99)
POTASSIUM: 4.1 mmol/L (ref 3.5–5.1)
Sodium: 142 mmol/L (ref 135–145)

## 2015-01-09 MED ORDER — METHYLPREDNISOLONE SODIUM SUCC 125 MG IJ SOLR
125.0000 mg | Freq: Once | INTRAMUSCULAR | Status: AC
  Administered 2015-01-09: 125 mg via INTRAVENOUS
  Filled 2015-01-09: qty 2

## 2015-01-09 MED ORDER — DIPHENHYDRAMINE HCL 50 MG/ML IJ SOLN
12.5000 mg | Freq: Once | INTRAMUSCULAR | Status: AC
  Administered 2015-01-09: 12.5 mg via INTRAVENOUS
  Filled 2015-01-09: qty 1

## 2015-01-09 MED ORDER — FAMOTIDINE IN NACL 20-0.9 MG/50ML-% IV SOLN
20.0000 mg | Freq: Once | INTRAVENOUS | Status: AC
  Administered 2015-01-09: 20 mg via INTRAVENOUS
  Filled 2015-01-09: qty 50

## 2015-01-09 MED ORDER — PREDNISONE 20 MG PO TABS
ORAL_TABLET | ORAL | Status: DC
Start: 2015-01-09 — End: 2015-06-18

## 2015-01-09 NOTE — ED Notes (Signed)
Attempted to call report to [facility] three separate times, unsuccessful. The first time someone picked up and said they had no one to transport patient home and that I'd have to call for a non-emergency transport service, then left me on hold for 7 minutes. PT[facility] notified.  The second and third times the phone stopped ringing.

## 2015-01-09 NOTE — ED Notes (Signed)
Per EMS- Patient is a resident of [facility]. Patient was started on Cogentin on 12/26/14. Patient has bilateral under eye puffiness, small area of rash on the right chest, burning sensation in his throat, and slight c/o itching. Patient was given Benadryl per Staff at [facility]. Lungs clear. No difficulty breathing or swallowing.

## 2015-01-09 NOTE — ED Notes (Signed)
Bed: MD47 Expected date:  Expected time:  Means of arrival:  Comments: Ems- SI

## 2015-01-09 NOTE — ED Notes (Signed)
[facility] here to transport [facility] back [facility]

## 2015-01-09 NOTE — Discharge Instructions (Signed)
STOP COGENTIN. BENADRYL 25MG PO EVERY 6HOURS AS NEEDED.  Anaphylactic Reaction An anaphylactic reaction is a sudden, severe allergic reaction that involves the whole body. It can be life threatening. A hospital stay is often required. People with asthma, eczema, or hay fever are slightly more likely to have an anaphylactic reaction. CAUSES  An anaphylactic reaction may be caused by anything to which you are allergic. After being exposed to the allergic substance, your immune system becomes sensitized to it. When you are exposed to that allergic substance again, an allergic reaction can occur. Common causes of an anaphylactic reaction include:  Medicines.  Foods, especially peanuts, wheat, shellfish, milk, and eggs.  Insect bites or stings.  Blood products.  Chemicals, such as dyes, latex, and contrast material used for imaging tests. SYMPTOMS  When an allergic reaction occurs, the body releases histamine and other substances. These substances cause symptoms such as tightening of the airway. Symptoms often develop within seconds or minutes of exposure. Symptoms may include:  Skin rash or hives.  Itching.  Chest tightness.  Swelling of the eyes, tongue, or lips.  Trouble breathing or swallowing.  Lightheadedness or fainting.  Anxiety or confusion.  Stomach pains, vomiting, or diarrhea.  Nasal congestion.  A fast or irregular heartbeat (palpitations). DIAGNOSIS  Diagnosis is based on your history of recent exposure to allergic substances, your symptoms, and a physical exam. Your caregiver may also perform blood or urine tests to confirm the diagnosis. TREATMENT  Epinephrine medicine is the main treatment for an anaphylactic reaction. Other medicines that may be used for treatment include antihistamines, steroids, and albuterol. In severe cases, fluids and medicine to support blood pressure may be given through an intravenous line (IV). Even if you improve after treatment, you  need to be observed to make sure your condition does not get worse. This may require a stay in the hospital. [place] a medical alert bracelet or necklace stating your allergy.  You and your family must learn how to use an anaphylaxis kit or give an epinephrine injection to temporarily treat an emergency allergic reaction. Always carry your epinephrine injection or anaphylaxis kit with you. This can be lifesaving if you have a severe reaction.  Do not drive or perform tasks after treatment until the medicines used to treat your reaction have worn off, or until your caregiver says it is okay.  If you have hives or a rash:  Take medicines as directed by your caregiver.  You may use an over-the-counter antihistamine (diphenhydramine) as needed.  Apply cold compresses to the skin or take baths in cool water. Avoid hot baths or showers. SEEK MEDICAL CARE IF:   You develop symptoms of an allergic reaction to a new substance. Symptoms may start right away or minutes later.  You develop a rash, hives, or itching.  You develop new symptoms. SEEK IMMEDIATE MEDICAL CARE IF:   You have swelling of the mouth, difficulty breathing, or wheezing.  You have a tight feeling in your chest or throat.  You develop hives, swelling, or itching all over your body.  You develop severe vomiting or diarrhea.  You feel faint or pass out. This is an emergency. Use your epinephrine injection or anaphylaxis kit as you have been instructed. Call your local emergency services (911 in [place] Even if you improve after the injection, you need to be examined at a hospital emergency department. MAKE SURE YOU:   Understand these instructions.  Will watch your condition.  Will get help right away if you are not doing well or get worse.   This information is not intended to replace advice given to you by your health care provider. Make sure you discuss any questions you have with your health  care provider.   Document Released: 03/08/2005 Document Revised: 03/13/2013 Document Reviewed: 09/18/2014 Elsevier Interactive Patient Education [employer].

## 2015-01-09 NOTE — ED Provider Notes (Signed)
CSN: [id number]     Arrival date & time 01/09/15  1337 History   First MD Initiated Contact with Patient 01/09/15 1347     Chief Complaint  Patient presents with  . Allergic Reaction     (Consider location/radiation/quality/duration/timing/severity/associated sxs/prior Treatment) HPI Comments: Patient sent to the emergency department from skilled nursing facility for evaluation of allergic reaction. Patient was reportedly started on Cogentin 2 weeks ago. 2 days ago he developed bilateral eye swelling and rash on his chest. Today the swelling of his eyes worsened. He has complained of a burning sensation in his throat and his itching. He denies any trouble swallowing. He has not been short of breath.  Patient is a 81 y.o. male presenting with allergic reaction.  Allergic Reaction Presenting symptoms: rash     Past Medical History  Diagnosis Date  . Dementia   . Hypertension   . Arthritis   . Hypothyroidism   . Chronic back pain   . Anxiety disorder   . Gastroesophageal reflux disease   . Polypharmacy     History of hospitalizations for altered mental status related to medications  . Chronic constipation     with fecal impactions, recurrent  . Suicidal ideation 06/06/2011  . Traumatic subdural hematoma ([place]) 10/01/2013  . Multiple facial fractures ([place]) 10/02/2013  . Basilar skull fracture ([place]) 10/02/2013  . Acute blood loss anemia 10/02/2013  . Neuropathy ([place])   . Depression   . Mood disorder ([place])   . BPH (benign prostatic hyperplasia)   . Tremor   . Osteoarthritis    Past Surgical History  Procedure Laterality Date  . Appendectomy    . Removal of nonrestorable teeth numbers 2, 4, 6, 12, 13, 18, 29     Family History  Problem Relation Age of Onset  . Heart disease Father    Social History  Substance Use Topics  . Smoking status: Never Smoker   . Smokeless tobacco: Never Used  . Alcohol Use: No     Comment: Pt denies    Review of Systems  Skin: Positive for  rash.  All other systems reviewed and are negative.     Allergies  Review of patient's allergies indicates no known allergies.  Home Medications   Prior to Admission medications   Medication Sig Start Date End Date Taking? Authorizing Provider  ARIPiprazole (ABILIFY) 30 MG tablet Take 25 mg by mouth daily.     Historical Provider, MD  benztropine (COGENTIN) 0.5 MG tablet Take 0.5 mg by mouth daily.    Historical Provider, MD  cyanocobalamin 1000 MCG tablet Take 100 mcg by mouth daily.    Historical Provider, MD  finasteride (PROSCAR) 5 MG tablet Take 5 mg by mouth daily.    Historical Provider, MD  gabapentin (NEURONTIN) 800 MG tablet Take 600 mg by mouth 2 (two) times daily.     Historical Provider, MD  levothyroxine (SYNTHROID, LEVOTHROID) 88 MCG tablet Take 88 mcg by mouth at bedtime.    Historical Provider, MD  lidocaine (LIDODERM) 5 % Place 3 patches onto the skin 2 (two) times daily. Apply 1 patch to left side twice daily related to chronic pain & 1 patch to lower back twice daily for pain. Also apply 1 patch to cervical spine-neck twice daily.    Historical Provider, MD  Linaclotide Rolan Lipa) 290 MCG CAPS capsule Take 290 mcg by mouth every morning.     Historical Provider, MD  methadone (DOLOPHINE) 5 MG tablet Take 1 tablet (5 mg  total) by mouth every 12 (twelve) hours. 01/08/15   Gildardo Cranker, DO  mirtazapine (REMERON) 15 MG tablet Take 15 mg by mouth at bedtime.    Historical Provider, MD  Multiple Vitamins-Minerals (MULTIVITAMINS THER. W/MINERALS) TABS Take 1 tablet by mouth daily.    Historical Provider, MD  omeprazole (PRILOSEC) 20 MG capsule Take 20 mg by mouth daily.    Historical Provider, MD  polyethylene glycol (MIRALAX / GLYCOLAX) packet Take 17 g by mouth daily. 02/24/14   Gerlene Fee, NP  Rivastigmine 13.3 MG/24HR PT24 Place 1 patch onto the skin every morning.    Historical Provider, MD  senna-docusate (SENOKOT-S) 8.6-50 MG per tablet Take 2 tablets by mouth at  bedtime. 09/26/14   Gerlene Fee, NP  tamsulosin (FLOMAX) 0.4 MG CAPS Take 0.4 mg by mouth at bedtime.     Historical Provider, MD   BP 160/73 mmHg  Pulse 82  Temp(Src) 98.6 F (37 C) (Oral)  Resp 20  SpO2 98% Physical Exam  Constitutional: He is oriented to person, place, and time. He appears well-developed and well-nourished. No distress.  HENT:  Head: Normocephalic and atraumatic.  Right Ear: Hearing normal.  Left Ear: Hearing normal.  Nose: Nose normal.  Mouth/Throat: Oropharynx is clear and moist and mucous membranes are normal.  Eyes: Conjunctivae and EOM are normal. Pupils are equal, round, and reactive to light.  Bilateral periorbital edema, right greater than left  Neck: Normal range of motion. Neck supple.  Cardiovascular: Regular rhythm, S1 normal and S2 normal.  Exam reveals no gallop and no friction rub.   No murmur heard. Pulmonary/Chest: Effort normal and breath sounds normal. No respiratory distress. He exhibits no tenderness.  Abdominal: Soft. Normal appearance and bowel sounds are normal. There is no hepatosplenomegaly. There is no tenderness. There is no rebound, no guarding, no tenderness at McBurney's point and negative Murphy's sign. No hernia.  Musculoskeletal: Normal range of motion.  Neurological: He is alert and oriented to person, place, and time. He has normal strength. No cranial nerve deficit or sensory deficit. Coordination normal. GCS eye subscore is 4. GCS verbal subscore is 5. GCS motor subscore is 6.  Skin: Skin is warm, dry and intact. Rash (Faint erythematous rash on chest) noted. No cyanosis.  Psychiatric: He has a normal mood and affect. His speech is normal and behavior is normal. Thought content normal.  Nursing note and vitals reviewed.   ED Course  Procedures (including critical care time) Labs Review Labs Reviewed - No data to display  Imaging Review No results found. I have personally reviewed and evaluated these images and lab  results as part of my medical decision-making.   EKG Interpretation None      MDM   Final diagnoses:  None    Patient presents to the emergency department for evaluation of allergic reaction. Patient has a reaction to unknown trigger, although he was recently started on Cogentin. Patient has slight rash and mild periorbital edema without any systemic symptoms.  Patient administered additional Benadryl, Pepcid, Solu-Medrol and monitored. His symptoms are improving. He has not had any difficulty breathing. No difficulty swallowing, and no vital sign abnormalities. Patient is appropriate for return to skilled nursing facility with continued Benadryl as needed and prednisone taper. Recommend stopping Cogentin.  Orpah Greek, MD 01/09/15 1550

## 2015-01-09 NOTE — Progress Notes (Signed)
Patient ID: George Foster, male   DOB: 1924-12-15, 81 y.o.   MRN: [id number]   Facility: [name]      No Known Allergies  Chief Complaint  Patient presents with  . Medical Management of Chronic Issues    HPI:  He is a long term resident of this facility being seen for the management of his chronic illnesses. He is complaining of having "water in his ears". He tells me that his ears leak onto his shoulders. He has dizziness fatigue drooling; and is nervous.    Past Medical History  Diagnosis Date  . Dementia   . Hypertension   . Arthritis   . Hypothyroidism   . Chronic back pain   . Anxiety disorder   . Gastroesophageal reflux disease   . Polypharmacy     History of hospitalizations for altered mental status related to medications  . Chronic constipation     with fecal impactions, recurrent  . Suicidal ideation 06/06/2011  . Traumatic subdural hematoma ([place]) 10/01/2013  . Multiple facial fractures ([place]) 10/02/2013  . Basilar skull fracture ([place]) 10/02/2013  . Acute blood loss anemia 10/02/2013    Past Surgical History  Procedure Laterality Date  . Appendectomy    . Removal of nonrestorable teeth numbers 2, 4, 6, 12, 13, 18, 29      VITAL SIGNS BP 135/85 mmHg  Pulse 76  Ht 5\' 7"  (1.702 m)  Wt 180 lb (81.647 kg)  BMI 28.19 kg/m2  SpO2 97%  Patient's Medications  New Prescriptions   No medications on file  Previous Medications   ARIPIPRAZOLE (ABILIFY) 30 MG TABLET    Take 25 mg by mouth daily.    CYANOCOBALAMIN 1000 MCG TABLET    Take 100 mcg by mouth daily.   FINASTERIDE (PROSCAR) 5 MG TABLET    Take 5 mg by mouth daily.   GABAPENTIN (NEURONTIN) 800 MG TABLET    Take 800 mg by mouth at bedtime.   LEVOTHYROXINE (SYNTHROID, LEVOTHROID) 88 MCG TABLET    Take 88 mcg by mouth at bedtime.   LIDOCAINE (LIDODERM) 5 %    Place 3 patches onto the skin 2 (two) times daily. Apply 1 patch to left side twice daily related to chronic pain & 1 patch to lower back  twice daily for pain. Also apply 1 patch to cervical spine-neck twice daily.   LINACLOTIDE (LINZESS) 290 MCG CAPS CAPSULE    Take 290 mcg by mouth every morning.    METHADONE (DOLOPHINE) 5 MG TABLET    Take 1 tablet (5 mg total) by mouth every 12 (twelve) hours.   MIRTAZAPINE (REMERON) 15 MG TABLET    Take 15 mg by mouth at bedtime.   MULTIPLE VITAMINS-MINERALS (MULTIVITAMINS THER. W/MINERALS) TABS    Take 1 tablet by mouth daily.   OMEPRAZOLE (PRILOSEC) 20 MG CAPSULE    Take 20 mg by mouth daily.   POLYETHYLENE GLYCOL (MIRALAX / GLYCOLAX) PACKET    Take 17 g by mouth daily.   RIVASTIGMINE 13.3 MG/24HR PT24    Place 1 patch onto the skin every morning.   SENNA-DOCUSATE (SENOKOT-S) 8.6-50 MG PER TABLET    Take 2 tablets by mouth at bedtime.   TAMSULOSIN (FLOMAX) 0.4 MG CAPS    Take 0.4 mg by mouth at bedtime.   Modified Medications   No medications on file  Discontinued Medications     SIGNIFICANT DIAGNOSTIC EXAMS   01-30-14: ct of head and cervical spine: 1. Small vessel  ischemic disease and brain atrophy. 2. No acute intracranial abnormalities. 3. Thoracic spondylosis noted.  10-24-14: chest x-ray: Mild scarring left base. No edema or consolidation. No pneumothorax evident  10-24-14:lumbar spine x-ray: Multilevel osteoarthritic change. Scoliosis. No acute fracture or spondylolisthesis.  10-24-14 thoracic spine x-ray: No acute fracture or listhesis identified in the thoracic spine. Multilevel degenerative disease, moderate to severe.  10-24-14: ct of head and cervical spine:  CT HEAD:  Remote left zygomatic arch fracture.  No acute calvarial fracture. No intracranial hemorrhage. Small vessel disease type changes without CT evidence of large acute Infarct. Atrophy without hydrocephalus.   CT CERVICAL SPINE: No cervical spine fracture. Cervical spondylotic changes with spinal stenosis most notable C5-6 with mild cord compression greater on the left.  10-30-14: chest x-ray: Mild scarring left  base.  No edema or consolidation.    LABS REVIEWED:   01-03-14; wbc 5.0; hgb 13.4; hct 43.0; mcv 110.2; plt 186; glucose 94; bun 19.5; creat 1.04; k+4.4 ;na++140; liver normal albumin 4.2; tsh 2.7 03-11-14: wbc 3.3; hgb 12.3; hct 37.6; mcv 108.4; plt 165; glucose 131; bun 35; creat 1.2; k+4.3; na++143;  Liver normal albumin 3.7  08-29-14: wbc 6.6 hgb 11.9; hct 38.3; mcv 104.2; plt 192; glucose 119; bun 23.1; creat 1.40; k+4.7; na++140; liver normal albumin 3.8 tsh 4.96  10-24-14: wbc 3.8; hgb 11.9; hct 35.9; mcv 100.8; plt 159; glucose 137; bun 25; creat 1.18; k+3.7; na++137 10-30-14: wbc 4.9; hgb 12.5; hct 37.4; mcv 103.6; plt 180; glucose 115; bun 22; creat 1.11; k+4.5; na++138; liver normal albumin 4.0  10-31-14:wbc 5.9; hgb 13.3; hct 40.6; mcv 106; plt 180 glucose 168; bun 23; creat 1.17; k+ 4.6; na++138 liver normal albumin 4.0; vit b12: 306; tsh 2.02;  vit d 42.5; hgb a1c 5.2 11-14-14: wbc 4.3; hgb 10.7; hct 34.5 ;mcv 107; plt 174; glucose 124; bun 24; creat 1.05; k+4.5; na++140; liver normal albumin 3.3  12-07-14: klebsiella pneumoniae: levaquin      Review of Systems  Constitutional: Positive for fatigue. Negative for appetite change.  HENT: Positive for drooling. Negative for congestion.        Has water in ears   Respiratory: Negative for cough, chest tightness and shortness of breath.   Cardiovascular: Negative for chest pain, palpitations and leg swelling.  Gastrointestinal: Negative for nausea, abdominal pain, diarrhea and constipation.  Musculoskeletal: Negative for myalgias and arthralgias.  Skin: Negative for pallor.  Neurological: Positive for dizziness.  Psychiatric/Behavioral: The patient is not nervous/anxious.       Physical Exam Constitutional: He is oriented to person, place, and time. He appears well-developed and well-nourished. No distress.  Eyes: Pupils are equal, round, and reactive to light.  Neck: Neck supple. No JVD present. No thyromegaly present.    Cardiovascular: Normal rate, regular rhythm and intact distal pulses.   Respiratory: Effort normal and breath sounds normal. No respiratory distress.  GI: Soft. Bowel sounds are normal. He exhibits no distension. There is no tenderness.  Musculoskeletal: He exhibits no edema.  Is able to move all extremities Has limited range of motion in neck Has mild tenderness to palpation to lower back   Neurological: He is alert and oriented to person, place, and time.  Skin: Skin is warm and dry. He is not diaphoretic.  Psychiatric: He has a normal mood and affect.      ASSESSMENT/ PLAN:  1. Chronic back pain: with cervical osteoarthritis: his pain is presently being managed on his current regimen of: methadone 5 mg twice daily;  lidoderm patch to cervical spine and left lower back and left side;  His other narcotics were stopped while he was hospitalized  Will continue to monitor his status.   2. BPH without obstruction: will continue proscar 5 mg daily and flomax 0.4 mg daily   3. Hypothyroidism: will continue synthroid 88 mcg daily his tsh is 2.02   4. GERD; will continue prilosec 20 mg daily   5. Chronic constipation: related to narcotic use: will continue colace twice daily; linzess 290 mcg daily; miralax 17 gm daily; and senna s 2 tabs nightly   6. Dementia: is without change in status; will continue exelon patch 13.3 mg daily   7. Psychosis with anxiety: will continue abilify 25 mg daily  And remeron 15 mg nightly to help with his sleep will monitor his status.   8. Hypertension: is stable is presently not on medications; will not make changes and will monitor his status.     Ok Edwards NP [facility]  Contact [phone] Monday through Friday 8am- 5pm  After hours call [phone]

## 2015-01-09 NOTE — ED Notes (Signed)
Bed: IY64 Expected date:  Expected time:  Means of arrival:  Comments: Ems- elderly

## 2015-01-09 NOTE — Progress Notes (Signed)
Patient ID: George Foster, male   DOB: 1925/02/12, 81 y.o.   MRN: [id number]    Facility: [name]      No Known Allergies  Chief Complaint  Patient presents with  . Acute Visit    allergic reaction     HPI:   On 12-30-14 he was started on cogentin 0.5 mg daily per psych services. Staff reports today that he has edema in face and neck. He states that his throat is scratchy but denies any shortness of pain. Initially he did not have chest pain. He was given benadryl 50 mg po one time; after taking this medication his symptoms did not improve; his right eye remained swollen shut; with swelling appearing on the left eye as well; he has developed chest pressure.     Past Medical History  Diagnosis Date  . Dementia   . Hypertension   . Arthritis   . Hypothyroidism   . Chronic back pain   . Anxiety disorder   . Gastroesophageal reflux disease   . Polypharmacy     History of hospitalizations for altered mental status related to medications  . Chronic constipation     with fecal impactions, recurrent  . Suicidal ideation 06/06/2011  . Traumatic subdural hematoma ([place]) 10/01/2013  . Multiple facial fractures ([place]) 10/02/2013  . Basilar skull fracture ([place]) 10/02/2013  . Acute blood loss anemia 10/02/2013    Past Surgical History  Procedure Laterality Date  . Appendectomy    . Removal of nonrestorable teeth numbers 2, 4, 6, 12, 13, 18, 29      VITAL SIGNS BP 129/81 mmHg  Pulse 69  Ht 5\' 7"  (1.702 m)  Wt 180 lb (81.647 kg)  BMI 28.19 kg/m2  SpO2 97%  Patient's Medications  New Prescriptions   No medications on file  Previous Medications   ARIPIPRAZOLE (ABILIFY) 30 MG TABLET    Take 25 mg by mouth daily.    BENZTROPINE (COGENTIN) 0.5 MG TABLET    Take 0.5 mg by mouth daily.   CYANOCOBALAMIN 1000 MCG TABLET    Take 100 mcg by mouth daily.   FINASTERIDE (PROSCAR) 5 MG TABLET    Take 5 mg by mouth daily.   GABAPENTIN (NEURONTIN) 800 MG TABLET    Take 600 mg  by mouth 2 (two) times daily.    LEVOTHYROXINE (SYNTHROID, LEVOTHROID) 88 MCG TABLET    Take 88 mcg by mouth at bedtime.   LIDOCAINE (LIDODERM) 5 %    Place 3 patches onto the skin 2 (two) times daily. Apply 1 patch to left side twice daily related to chronic pain & 1 patch to lower back twice daily for pain. Also apply 1 patch to cervical spine-neck twice daily.   LINACLOTIDE (LINZESS) 290 MCG CAPS CAPSULE    Take 290 mcg by mouth every morning.    METHADONE (DOLOPHINE) 5 MG TABLET    Take 1 tablet (5 mg total) by mouth every 12 (twelve) hours.   MIRTAZAPINE (REMERON) 15 MG TABLET    Take 15 mg by mouth at bedtime.   MULTIPLE VITAMINS-MINERALS (MULTIVITAMINS THER. W/MINERALS) TABS    Take 1 tablet by mouth daily.   OMEPRAZOLE (PRILOSEC) 20 MG CAPSULE    Take 20 mg by mouth daily.   POLYETHYLENE GLYCOL (MIRALAX / GLYCOLAX) PACKET    Take 17 g by mouth daily.   RIVASTIGMINE 13.3 MG/24HR PT24    Place 1 patch onto the skin every morning.   SENNA-DOCUSATE (SENOKOT-S) 8.6-50  MG PER TABLET    Take 2 tablets by mouth at bedtime.   TAMSULOSIN (FLOMAX) 0.4 MG CAPS    Take 0.4 mg by mouth at bedtime.   Modified Medications   No medications on file  Discontinued Medications   No medications on file     SIGNIFICANT DIAGNOSTIC EXAMS    01-30-14: ct of head and cervical spine: 1. Small vessel ischemic disease and brain atrophy. 2. No acute intracranial abnormalities. 3. Thoracic spondylosis noted.  10-24-14: chest x-ray: Mild scarring left base. No edema or consolidation. No pneumothorax evident  10-24-14:lumbar spine x-ray: Multilevel osteoarthritic change. Scoliosis. No acute fracture or spondylolisthesis.  10-24-14 thoracic spine x-ray: No acute fracture or listhesis identified in the thoracic spine. Multilevel degenerative disease, moderate to severe.  10-24-14: ct of head and cervical spine:  CT HEAD:  Remote left zygomatic arch fracture.  No acute calvarial fracture. No intracranial hemorrhage.  Small vessel disease type changes without CT evidence of large acute Infarct. Atrophy without hydrocephalus.   CT CERVICAL SPINE: No cervical spine fracture. Cervical spondylotic changes with spinal stenosis most notable C5-6 with mild cord compression greater on the left.  10-30-14: chest x-ray: Mild scarring left base.  No edema or consolidation.    LABS REVIEWED:   01-03-14; wbc 5.0; hgb 13.4; hct 43.0; mcv 110.2; plt 186; glucose 94; bun 19.5; creat 1.04; k+4.4 ;na++140; liver normal albumin 4.2; tsh 2.7 03-11-14: wbc 3.3; hgb 12.3; hct 37.6; mcv 108.4; plt 165; glucose 131; bun 35; creat 1.2; k+4.3; na++143;  Liver normal albumin 3.7  08-29-14: wbc 6.6 hgb 11.9; hct 38.3; mcv 104.2; plt 192; glucose 119; bun 23.1; creat 1.40; k+4.7; na++140; liver normal albumin 3.8 tsh 4.96  10-24-14: wbc 3.8; hgb 11.9; hct 35.9; mcv 100.8; plt 159; glucose 137; bun 25; creat 1.18; k+3.7; na++137 10-30-14: wbc 4.9; hgb 12.5; hct 37.4; mcv 103.6; plt 180; glucose 115; bun 22; creat 1.11; k+4.5; na++138; liver normal albumin 4.0  10-31-14:wbc 5.9; hgb 13.3; hct 40.6; mcv 106; plt 180 glucose 168; bun 23; creat 1.17; k+ 4.6; na++138 liver normal albumin 4.0; vit b12: 306; tsh 2.02;  vit d 42.5; hgb a1c 5.2 11-14-14: wbc 4.3; hgb 10.7; hct 34.5 ;mcv 107; plt 174; glucose 124; bun 24; creat 1.05; k+4.5; na++140; liver normal albumin 3.3  12-07-14: klebsiella pneumoniae: levaquin        Review of Systems  Constitutional: Negative for appetite change and fatigue.  HENT: Negative for congestion.        Has scratchy throat   Eyes:       Has on on eyes   Respiratory: Negative for cough, chest tightness and shortness of breath.   Cardiovascular: Positive for chest pain. Negative for palpitations and leg swelling.       Has chest pressure present   Gastrointestinal: Negative for nausea, abdominal pain, diarrhea and constipation.  Musculoskeletal: Negative for myalgias and arthralgias.  Skin: Negative for pallor.   Neurological: Negative for dizziness.  Psychiatric/Behavioral: The patient is not nervous/anxious.       Physical Exam Constitutional: He is oriented to person, place, and time. He appears well-developed and well-nourished. No distress.  Eyes: right  eye swollen shut; left eye swollen He has facial edema present and has right hand edema present as well.  Neck: Neck supple. No JVD present. No thyromegaly present.  Cardiovascular: Normal rate, regular rhythm and intact distal pulses.   Respiratory: Effort normal and breath sounds normal. No respiratory distress.  GI: Soft.  Bowel sounds are normal. He exhibits no distension. There is no tenderness.  Musculoskeletal: He exhibits no edema.  Is able to move all extremities Has limited range of motion in neck Has mild tenderness to palpation to lower back   Neurological: He is alert and oriented to person, place, and time.  Skin: Skin is warm and dry. He is not diaphoretic.  Psychiatric: He has a normal mood and affect.      ASSESSMENT/ PLAN:  1. Angioedema: he did no respond to the benadryl 50 mg po given; will send him to the ED for further treatment options. He is in agreement with this plan.    Time spent with patient  45  minutes >50% time spent counseling; reviewing medical record; tests; labs; and developing future plan of care   Ok Edwards NP [facility]  Contact [phone] Monday through Friday 8am- 5pm  After hours call [phone]

## 2015-01-23 ENCOUNTER — Encounter: Payer: Self-pay | Admitting: Internal Medicine

## 2015-01-23 ENCOUNTER — Non-Acute Institutional Stay (SKILLED_NURSING_FACILITY): Payer: Medicare Other | Admitting: Internal Medicine

## 2015-01-23 DIAGNOSIS — E034 Atrophy of thyroid (acquired): Secondary | ICD-10-CM

## 2015-01-23 DIAGNOSIS — G609 Hereditary and idiopathic neuropathy, unspecified: Secondary | ICD-10-CM

## 2015-01-23 DIAGNOSIS — E038 Other specified hypothyroidism: Secondary | ICD-10-CM | POA: Diagnosis not present

## 2015-01-23 DIAGNOSIS — K219 Gastro-esophageal reflux disease without esophagitis: Secondary | ICD-10-CM

## 2015-01-23 NOTE — Assessment & Plan Note (Addendum)
Last TSH seen 10/2014 2.02; plan-  cont 88 mc daily for now

## 2015-01-23 NOTE — Progress Notes (Signed)
MRN: [id number] Name: George Foster  Sex: male Age: 81 y.o. DOB: 15-May-1924  [place] #: Karren Burly Facility/Room:217 Level Of Care: SNF Provider: Inocencio Homes D Emergency Contacts: Extended Emergency Contact Information Primary Emergency Contact: Andrews,Shirley Address: [address]          [place], [place] [address] [place] Phone: [phone] Work Phone: [phone] Relation: Legal Guardian Secondary Emergency Contact: Trinda Pascal Address: [address]          [place], [place] [address] Johnnette Litter of [place] Phone: [phone] Mobile Phone: [phone] Relation: Spouse  Code Status:   Allergies: Cogentin  Chief Complaint  Patient presents with  . Medical Management of Chronic Issues    HPI: Patient is 81 y.o. male with dementia, HTN,OA, hypothyroid, LBP, GERD,mood disorder ,BPH, s/p SDH who is being seen for routine issues of polyneuropathy, GERD and hypothyroid.   Past Medical History  Diagnosis Date  . Dementia   . Hypertension   . Arthritis   . Hypothyroidism   . Chronic back pain   . Anxiety disorder   . Gastroesophageal reflux disease   . Polypharmacy     History of hospitalizations for altered mental status related to medications  . Chronic constipation     with fecal impactions, recurrent  . Suicidal ideation 06/06/2011  . Traumatic subdural hematoma ([place]) 10/01/2013  . Multiple facial fractures ([place]) 10/02/2013  . Basilar skull fracture ([place]) 10/02/2013  . Acute blood loss anemia 10/02/2013  . Neuropathy ([place])   . Depression   . Mood disorder ([place])   . BPH (benign prostatic hyperplasia)   . Tremor   . Osteoarthritis     Past Surgical History  Procedure Laterality Date  . Appendectomy    . Removal of nonrestorable teeth numbers 2, 4, 6, 12, 13, 18, 29        Medication List       This list is accurate as of: 01/23/15  8:46 PM.  Always use your most recent med list.               ARIPiprazole 20 MG tablet   Commonly known as:  ABILIFY  Take 20 mg by mouth daily. Take with 5 mg tab     ARIPiprazole 5 MG tablet  Commonly known as:  ABILIFY  Take 5 mg by mouth daily. Take with 20 mg tab     benztropine 0.5 MG tablet  Commonly known as:  COGENTIN  Take 0.5 mg by mouth daily.     cyanocobalamin 1000 MCG tablet  Take 1,000 mcg by mouth daily.     diphenhydrAMINE 25 MG tablet  Commonly known as:  BENADRYL  Take 25-50 mg by mouth every 6 (six) hours as needed for allergies (allergic reaction).     finasteride 5 MG tablet  Commonly known as:  PROSCAR  Take 5 mg by mouth daily.     gabapentin 600 MG tablet  Commonly known as:  NEURONTIN  Take 600 mg by mouth 2 (two) times daily.     levothyroxine 88 MCG tablet  Commonly known as:  SYNTHROID, LEVOTHROID  Take 88 mcg by mouth daily before breakfast.     lidocaine 5 %  Commonly known as:  LIDODERM  Place 2 patches onto the skin 2 (two) times daily. Apply 1 patch to left side twice daily related to chronic pain & 1 patch to lower back twice daily for pain. Also apply to neck-cervical spine topically twice daily  LINZESS 290 MCG Caps capsule  Generic drug:  Linaclotide  Take 290 mcg by mouth every morning.     methadone 5 MG tablet  Commonly known as:  DOLOPHINE  Take 1 tablet (5 mg total) by mouth every 12 (twelve) hours.     mirtazapine 15 MG tablet  Commonly known as:  REMERON  Take 15 mg by mouth at bedtime.     multivitamins ther. w/minerals Tabs tablet  Take 1 tablet by mouth daily.     omeprazole 40 MG capsule  Commonly known as:  PRILOSEC  Take 40 mg by mouth daily.     polyethylene glycol packet  Commonly known as:  MIRALAX / GLYCOLAX  Take 17 g by mouth daily.     predniSONE 20 MG tablet  Commonly known as:  DELTASONE  3 tabs po daily x 3 days, then 2 tabs x 3 days, then 1.5 tabs x 3 days, then 1 tab x 3 days, then 0.5 tabs x 3 days     Rivastigmine 13.3 MG/24HR Pt24  Place 1 patch onto the skin every  morning.     senna-docusate 8.6-50 MG tablet  Commonly known as:  Senokot-S  Take 2 tablets by mouth at bedtime.     tamsulosin 0.4 MG Caps capsule  Commonly known as:  FLOMAX  Take 0.4 mg by mouth at bedtime.        No orders of the defined types were placed in this encounter.    Immunization History  Administered Date(s) Administered  . Influenza-Unspecified 10/08/2013  . PPD Test 10/07/2013  . Tdap 09/27/2013    Social History  Substance Use Topics  . Smoking status: Never Smoker   . Smokeless tobacco: Never Used  . Alcohol Use: No     Comment: Pt denies    Review of Systems  DATA OBTAINED: from patient, nurse GENERAL:  no fevers, fatigue, appetite changes SKIN: No itching, rash HEENT: No complaint RESPIRATORY: No cough, wheezing, SOB CARDIAC: No chest pain, palpitations, lower extremity edema  GI: No abdominal pain, No N/V/D or constipation, No heartburn or reflux  GU: No dysuria, frequency or urgency, or incontinence  MUSCULOSKELETAL: No unrelieved bone/joint pain NEUROLOGIC: No headache, dizziness  PSYCHIATRIC: No overt anxiety or sadness  Filed Vitals:   01/23/15 1312  BP: 132/78  Pulse: 72  Temp: 98.1 F (36.7 C)  Resp: 20    Physical Exam  GENERAL APPEARANCE: Alert, conversant, No acute distress  SKIN: No diaphoresis rash, or wounds HEENT: Unremarkable RESPIRATORY: Breathing is even, unlabored. Lung sounds are clear   CARDIOVASCULAR: Heart RRR no murmurs, rubs or gallops. No peripheral edema  GASTROINTESTINAL: Abdomen is soft, non-tender, not distended w/ normal bowel sounds.  GENITOURINARY: Bladder non tender, not distended  MUSCULOSKELETAL: No abnormal joints or musculature NEUROLOGIC: Cranial nerves 2-12 grossly intact. Moves all extremities PSYCHIATRIC: Mood and affect appropriate to situation,with dementia;  no behavioral issues  Patient Active Problem List   Diagnosis Date Noted  . Angioedema 01/09/2015  . Major depressive disorder,  recurrent episode ([place]) 12/03/2014  . Essential hypertension, benign 11/19/2014  . Psychosis 11/19/2014  . Mood disorder ([place]) 10/31/2014  . Hereditary and idiopathic peripheral neuropathy 07/01/2014  . Insomnia 04/19/2014  . Fall 10/02/2013  . BPH (benign prostatic hyperplasia) 02/17/2013  . Osteoarthritis cervical spine 02/17/2013  . Chronic constipation   . Chronic back pain 06/08/2011  . Dementia with behavioral disturbance 06/06/2011  . Hypothyroidism   . Gastroesophageal reflux disease   . Anxiety  disorder     CBC    Component Value Date/Time   WBC 5.2 01/09/2015 1418   RBC 3.45* 01/09/2015 1418   HGB 11.7* 01/09/2015 1418   HCT 35.3* 01/09/2015 1418   PLT 143* 01/09/2015 1418   MCV 102.3* 01/09/2015 1418   LYMPHSABS 0.8 01/09/2015 1418   MONOABS 0.3 01/09/2015 1418   EOSABS 0.2 01/09/2015 1418   BASOSABS 0.0 01/09/2015 1418    CMP     Component Value Date/Time   NA 142 01/09/2015 1418   K 4.1 01/09/2015 1418   CL 111 01/09/2015 1418   CO2 25 01/09/2015 1418   GLUCOSE 141* 01/09/2015 1418   BUN 22* 01/09/2015 1418   CREATININE 1.08 01/09/2015 1418   CALCIUM 8.4* 01/09/2015 1418   PROT 6.5 10/30/2014 1627   ALBUMIN 4.0 10/30/2014 1627   AST 18 10/30/2014 1627   ALT 11* 10/30/2014 1627   ALKPHOS 66 10/30/2014 1627   BILITOT 0.6 10/30/2014 1627   GFRNONAA 59* 01/09/2015 1418   GFRAA >60 01/09/2015 1418    Assessment and Plan  Hypothyroidism Last TSH seen 10/2014 2.02; plan-  cont 88 mc daily for now  Hereditary and idiopathic peripheral neuropathy Chronic and stable; Plan - cont neurontin 600 mg BID  Gastroesophageal reflux disease Stable and chronic without signs or sx; Plan - cont prilosec 20 mg daily     [name], MD

## 2015-01-23 NOTE — Assessment & Plan Note (Signed)
Chronic and stable; Plan - cont neurontin 600 mg BID

## 2015-01-23 NOTE — Assessment & Plan Note (Signed)
Stable and chronic without signs or sx; Plan - cont prilosec 20 mg daily

## 2015-02-06 ENCOUNTER — Other Ambulatory Visit: Payer: Self-pay | Admitting: *Deleted

## 2015-02-06 DIAGNOSIS — G8929 Other chronic pain: Secondary | ICD-10-CM

## 2015-02-06 DIAGNOSIS — M549 Dorsalgia, unspecified: Principal | ICD-10-CM

## 2015-02-06 MED ORDER — METHADONE HCL 5 MG PO TABS: ORAL_TABLET | ORAL | Status: DC

## 2015-02-06 NOTE — Telephone Encounter (Signed)
[facility]

## 2015-02-07 ENCOUNTER — Other Ambulatory Visit: Payer: Self-pay

## 2015-02-07 DIAGNOSIS — G8929 Other chronic pain: Secondary | ICD-10-CM

## 2015-02-07 DIAGNOSIS — M549 Dorsalgia, unspecified: Principal | ICD-10-CM

## 2015-02-07 MED ORDER — METHADONE HCL 5 MG PO TABS: ORAL_TABLET | ORAL | Status: DC

## 2015-02-07 NOTE — Telephone Encounter (Signed)
RX faxed to [facility] @ [phone], phone number [phone] 

## 2015-02-27 ENCOUNTER — Non-Acute Institutional Stay (SKILLED_NURSING_FACILITY): Payer: Medicare Other | Admitting: Adult Health

## 2015-02-27 DIAGNOSIS — F0391 Unspecified dementia with behavioral disturbance: Secondary | ICD-10-CM

## 2015-02-27 DIAGNOSIS — K5900 Constipation, unspecified: Secondary | ICD-10-CM

## 2015-02-27 DIAGNOSIS — M549 Dorsalgia, unspecified: Secondary | ICD-10-CM | POA: Diagnosis not present

## 2015-02-27 DIAGNOSIS — I10 Essential (primary) hypertension: Secondary | ICD-10-CM

## 2015-02-27 DIAGNOSIS — N400 Enlarged prostate without lower urinary tract symptoms: Secondary | ICD-10-CM

## 2015-02-27 DIAGNOSIS — F333 Major depressive disorder, recurrent, severe with psychotic symptoms: Secondary | ICD-10-CM

## 2015-02-27 DIAGNOSIS — K5909 Other constipation: Secondary | ICD-10-CM

## 2015-02-27 DIAGNOSIS — K219 Gastro-esophageal reflux disease without esophagitis: Secondary | ICD-10-CM | POA: Diagnosis not present

## 2015-02-27 DIAGNOSIS — F03918 Unspecified dementia, unspecified severity, with other behavioral disturbance: Secondary | ICD-10-CM

## 2015-02-27 DIAGNOSIS — M47812 Spondylosis without myelopathy or radiculopathy, cervical region: Secondary | ICD-10-CM

## 2015-02-27 DIAGNOSIS — G8929 Other chronic pain: Secondary | ICD-10-CM | POA: Diagnosis not present

## 2015-03-20 ENCOUNTER — Encounter: Payer: Self-pay | Admitting: Internal Medicine

## 2015-03-20 ENCOUNTER — Non-Acute Institutional Stay (SKILLED_NURSING_FACILITY): Payer: Medicare Other | Admitting: Internal Medicine

## 2015-03-20 DIAGNOSIS — R5383 Other fatigue: Secondary | ICD-10-CM | POA: Diagnosis not present

## 2015-03-20 DIAGNOSIS — H02846 Edema of left eye, unspecified eyelid: Secondary | ICD-10-CM | POA: Diagnosis not present

## 2015-03-20 NOTE — Progress Notes (Signed)
MRN: [id number] Name: George Foster  Sex: male Age: 81 y.o. DOB: 08-12-1924  [place] #: Karren Burly Facility/Room:217 Level Of Care: SNF Provider: Inocencio Homes D Emergency Contacts: Extended Emergency Contact Information Primary Emergency Contact: Andrews,Shirley Address: [address]          [place], [place] [address] [place] Phone: [phone] Work Phone: [phone] Relation: Legal Guardian Secondary Emergency Contact: Trinda Pascal Address: [address]          [place], [place] [address] Johnnette Litter of [place] Phone: [phone] Mobile Phone: [phone] Relation: Spouse  Code Status:   Allergies: Cogentin  Chief Complaint  Patient presents with  . Acute Visit    HPI: Patient is 81 y.o. male with dementia, anxiety, chronic back pain, prior SDH and mood disorder that nursing asked me to see for a swollen L eye for 1 day. Pt denies it itches or hurts. No d/c , no cold or runny nose.No fever. Pt had told nurse this had happened prior and it went away ina few days on its own.   Past Medical History  Diagnosis Date  . Dementia   . Hypertension   . Arthritis   . Hypothyroidism   . Chronic back pain   . Anxiety disorder   . Gastroesophageal reflux disease   . Polypharmacy     History of hospitalizations for altered mental status related to medications  . Chronic constipation     with fecal impactions, recurrent  . Suicidal ideation 06/06/2011  . Traumatic subdural hematoma ([place]) 10/01/2013  . Multiple facial fractures ([place]) 10/02/2013  . Basilar skull fracture ([place]) 10/02/2013  . Acute blood loss anemia 10/02/2013  . Neuropathy ([place])   . Depression   . Mood disorder ([place])   . BPH (benign prostatic hyperplasia)   . Tremor   . Osteoarthritis     Past Surgical History  Procedure Laterality Date  . Appendectomy    . Removal of nonrestorable teeth numbers 2, 4, 6, 12, 13, 18, 29        Medication List       This list is accurate as of:  03/20/15  9:12 PM.  Always use your most recent med list.               ARIPiprazole 20 MG tablet  Commonly known as:  ABILIFY  Take 20 mg by mouth daily. Take with 5 mg tab     ARIPiprazole 5 MG tablet  Commonly known as:  ABILIFY  Take 5 mg by mouth daily. Take with 20 mg tab     benztropine 0.5 MG tablet  Commonly known as:  COGENTIN  Take 0.5 mg by mouth daily.     cyanocobalamin 1000 MCG tablet  Take 1,000 mcg by mouth daily.     diphenhydrAMINE 25 MG tablet  Commonly known as:  BENADRYL  Take 25-50 mg by mouth every 6 (six) hours as needed for allergies (allergic reaction).     finasteride 5 MG tablet  Commonly known as:  PROSCAR  Take 5 mg by mouth daily.     gabapentin 600 MG tablet  Commonly known as:  NEURONTIN  Take 600 mg by mouth 2 (two) times daily.     levothyroxine 88 MCG tablet  Commonly known as:  SYNTHROID, LEVOTHROID  Take 88 mcg by mouth daily before breakfast.     lidocaine 5 %  Commonly known as:  LIDODERM  Place 2 patches onto the skin 2 (two) times  daily. Apply 1 patch to left side twice daily related to chronic pain & 1 patch to lower back twice daily for pain. Also apply to neck-cervical spine topically twice daily     LINZESS 290 MCG Caps capsule  Generic drug:  Linaclotide  Take 290 mcg by mouth every morning.     methadone 5 MG tablet  Commonly known as:  DOLOPHINE  Take one tablet by mouth every 12 hours for pain     mirtazapine 15 MG tablet  Commonly known as:  REMERON  Take 15 mg by mouth at bedtime.     multivitamins ther. w/minerals Tabs tablet  Take 1 tablet by mouth daily.     omeprazole 40 MG capsule  Commonly known as:  PRILOSEC  Take 40 mg by mouth daily.     polyethylene glycol packet  Commonly known as:  MIRALAX / GLYCOLAX  Take 17 g by mouth daily.     predniSONE 20 MG tablet  Commonly known as:  DELTASONE  3 tabs po daily x 3 days, then 2 tabs x 3 days, then 1.5 tabs x 3 days, then 1 tab x 3 days, then 0.5  tabs x 3 days     Rivastigmine 13.3 MG/24HR Pt24  Place 1 patch onto the skin every morning.     senna-docusate 8.6-50 MG tablet  Commonly known as:  Senokot-S  Take 2 tablets by mouth at bedtime.     tamsulosin 0.4 MG Caps capsule  Commonly known as:  FLOMAX  Take 0.4 mg by mouth at bedtime.        No orders of the defined types were placed in this encounter.    Immunization History  Administered Date(s) Administered  . Influenza-Unspecified 10/08/2013  . PPD Test 10/07/2013  . Tdap 09/27/2013    Social History  Substance Use Topics  . Smoking status: Never Smoker   . Smokeless tobacco: Never Used  . Alcohol Use: No     Comment: Pt denies    Review of Systems  DATA OBTAINED: from patient, nurse GENERAL:  no fevers,+ fatigue for a month, no appetite changes SKIN: No itching, rash HEENT: L eye edema as in HPI RESPIRATORY: No cough, wheezing, SOB CARDIAC: No chest pain, palpitations, lower extremity edema  GI: No abdominal pain, No N/V/D or constipation, No heartburn or reflux  GU: No dysuria, frequency or urgency, or incontinence  MUSCULOSKELETAL: No unrelieved bone/joint pain NEUROLOGIC: No headache, dizziness  PSYCHIATRIC: No overt anxiety or sadness  Filed Vitals:   03/20/15 2105  BP: 130/88  Pulse: 67  Temp: 98.5 F (36.9 C)  Resp: 18    Physical Exam  GENERAL APPEARANCE: Alert, conversant, No acute distress  SKIN: No diaphoresis rash HEENT: L upper and lower lids swollen, no redness, no heat, conjunctiva clear without injection, no d/c RESPIRATORY: Breathing is even, unlabored. Lung sounds are clear   CARDIOVASCULAR: Heart RRR no murmurs, rubs or gallops. No peripheral edema  GASTROINTESTINAL: Abdomen is soft, non-tender, not distended w/ normal bowel sounds.  GENITOURINARY: Bladder non tender, not distended  MUSCULOSKELETAL: No abnormal joints or musculature NEUROLOGIC: Cranial nerves 2-12 grossly intact. Moves all extremities PSYCHIATRIC: Mood  and affect appropriate to situation, some dementia, no behavioral issues  Patient Active Problem List   Diagnosis Date Noted  . Angioedema 01/09/2015  . Major depressive disorder, recurrent episode ([place]) 12/03/2014  . Essential hypertension, benign 11/19/2014  . Psychosis 11/19/2014  . Mood disorder ([place]) 10/31/2014  . Hereditary and idiopathic peripheral  neuropathy 07/01/2014  . Insomnia 04/19/2014  . Fall 10/02/2013  . BPH (benign prostatic hyperplasia) 02/17/2013  . Osteoarthritis cervical spine 02/17/2013  . Chronic constipation   . Chronic back pain 06/08/2011  . Dementia with behavioral disturbance 06/06/2011  . Hypothyroidism   . Gastroesophageal reflux disease   . Anxiety disorder     CBC    Component Value Date/Time   WBC 5.2 01/09/2015 1418   RBC 3.45* 01/09/2015 1418   HGB 11.7* 01/09/2015 1418   HCT 35.3* 01/09/2015 1418   PLT 143* 01/09/2015 1418   MCV 102.3* 01/09/2015 1418   LYMPHSABS 0.8 01/09/2015 1418   MONOABS 0.3 01/09/2015 1418   EOSABS 0.2 01/09/2015 1418   BASOSABS 0.0 01/09/2015 1418    CMP     Component Value Date/Time   NA 142 01/09/2015 1418   K 4.1 01/09/2015 1418   CL 111 01/09/2015 1418   CO2 25 01/09/2015 1418   GLUCOSE 141* 01/09/2015 1418   BUN 22* 01/09/2015 1418   CREATININE 1.08 01/09/2015 1418   CALCIUM 8.4* 01/09/2015 1418   PROT 6.5 10/30/2014 1627   ALBUMIN 4.0 10/30/2014 1627   AST 18 10/30/2014 1627   ALT 11* 10/30/2014 1627   ALKPHOS 66 10/30/2014 1627   BILITOT 0.6 10/30/2014 1627   GFRNONAA 59* 01/09/2015 1418   GFRAA >60 01/09/2015 1418    Assessment and Plan  L eyelid swelling -  Looks allergic, local contact, will monitor  FATIGUE - c/o during ROS, pt with dementia, can't give much in details; chart was reviewed and it looks like has recurrent  UTIs. Have ordered U/A with C and S, CBC, CMP, TSH, Vit D, Vit B12   [name], MD

## 2015-03-23 ENCOUNTER — Encounter: Payer: Self-pay | Admitting: Adult Health

## 2015-03-23 NOTE — Progress Notes (Signed)
Patient ID: George Foster, male   DOB: 1925/02/25, 82 y.o.   MRN: [id number]    Facility:  [facility]      Allergies  Allergen Reactions  . Cogentin [Benztropine]     unkown    Chief Complaint  Patient presents with  . Medical Management of Chronic Issues    HPI:  He is a long term resident of this facility being seen for the management of his chronic illnesses. Overall there is little change in his status. He continues to have issues with his chronic pain management. He is able to function with his current regimen. There are no nursing concerns today.     Past Medical History  Diagnosis Date  . Dementia   . Hypertension   . Arthritis   . Hypothyroidism   . Chronic back pain   . Anxiety disorder   . Gastroesophageal reflux disease   . Polypharmacy     History of hospitalizations for altered mental status related to medications  . Chronic constipation     with fecal impactions, recurrent  . Suicidal ideation 06/06/2011  . Traumatic subdural hematoma ([place]) 10/01/2013  . Multiple facial fractures ([place]) 10/02/2013  . Basilar skull fracture ([place]) 10/02/2013  . Acute blood loss anemia 10/02/2013  . Neuropathy ([place])   . Depression   . Mood disorder ([place])   . BPH (benign prostatic hyperplasia)   . Tremor   . Osteoarthritis     Past Surgical History  Procedure Laterality Date  . Appendectomy    . Removal of nonrestorable teeth numbers 2, 4, 6, 12, 13, 18, 29      VITAL SIGNS BP 129/81 mmHg  Pulse 69  Ht 5\' 7"  (1.702 m)  Wt 184 lb (83.462 kg)  BMI 28.81 kg/m2  Patient's Medications  New Prescriptions   No medications on file  Previous Medications   ARIPIPRAZOLE (ABILIFY) 20 MG TABLET    Take 20 mg by mouth daily. Take with 5 mg tab   ARIPIPRAZOLE (ABILIFY) 5 MG TABLET    Take 5 mg by mouth daily. Take with 20 mg tab   CYANOCOBALAMIN 1000 MCG TABLET    Take 1,000 mcg by mouth daily.    DIPHENHYDRAMINE (BENADRYL) 25 MG TABLET    Take 25-50 mg by mouth every 6 (six)  hours as needed for allergies (allergic reaction).   FINASTERIDE (PROSCAR) 5 MG TABLET    Take 5 mg by mouth daily.   GABAPENTIN (NEURONTIN) 600 MG TABLET    Take 600 mg by mouth 2 (two) times daily.   LEVOTHYROXINE (SYNTHROID, LEVOTHROID) 88 MCG TABLET    Take 88 mcg by mouth daily before breakfast.    LIDOCAINE (LIDODERM) 5 %    Place 2 patches onto the skin 2 (two) times daily. Apply 1 patch to left side twice daily related to chronic pain & 1 patch to lower back twice daily for pain. Also apply to neck-cervical spine topically twice daily   LINACLOTIDE (LINZESS) 290 MCG CAPS CAPSULE    Take 290 mcg by mouth every morning.    METHADONE (DOLOPHINE) 5 MG TABLET    Take one tablet by mouth every 12 hours for pain   MIRTAZAPINE (REMERON) 15 MG TABLET    Take 15 mg by mouth at bedtime.   MULTIPLE VITAMINS-MINERALS (MULTIVITAMINS THER. W/MINERALS) TABS    Take 1 tablet by mouth daily.   OMEPRAZOLE (PRILOSEC) 40 MG CAPSULE    Take 40 mg by mouth daily.   POLYETHYLENE  GLYCOL (MIRALAX / GLYCOLAX) PACKET    Take 17 g by mouth daily.   PREDNISONE (DELTASONE) 20 MG TABLET    3 tabs po daily x 3 days, then 2 tabs x 3 days, then 1.5 tabs x 3 days, then 1 tab x 3 days, then 0.5 tabs x 3 days   RIVASTIGMINE 13.3 MG/24HR PT24    Place 1 patch onto the skin every morning.   SENNA-DOCUSATE (SENOKOT-S) 8.6-50 MG PER TABLET    Take 2 tablets by mouth at bedtime.   TAMSULOSIN (FLOMAX) 0.4 MG CAPS    Take 0.4 mg by mouth at bedtime.   Modified Medications   No medications on file  Discontinued Medications     SIGNIFICANT DIAGNOSTIC EXAMS  01-30-14: ct of head and cervical spine: 1. Small vessel ischemic disease and brain atrophy. 2. No acute intracranial abnormalities. 3. Thoracic spondylosis noted.  10-24-14: chest x-ray: Mild scarring left base. No edema or consolidation. No pneumothorax evident  10-24-14:lumbar spine x-ray: Multilevel osteoarthritic change. Scoliosis. No acute fracture  or spondylolisthesis.  10-24-14 thoracic spine x-ray: No acute fracture or listhesis identified in the thoracic spine. Multilevel degenerative disease, moderate to severe.  10-24-14: ct of head and cervical spine:  CT HEAD:  Remote left zygomatic arch fracture.  No acute calvarial fracture. No intracranial hemorrhage. Small vessel disease type changes without CT evidence of large acute Infarct. Atrophy without hydrocephalus.   CT CERVICAL SPINE: No cervical spine fracture. Cervical spondylotic changes with spinal stenosis most notable C5-6 with mild cord compression greater on the left.  10-30-14: chest x-ray: Mild scarring left base.  No edema or consolidation.  02-04-15: chest x-ray; negative for acute cardiopulmonary disease     LABS REVIEWED:   03-11-14: wbc 3.3; hgb 12.3; hct 37.6; mcv 108.4; plt 165; glucose 131; bun 35; creat 1.2; k+4.3; na++143;  Liver normal albumin 3.7  08-29-14: wbc 6.6 hgb 11.9; hct 38.3; mcv 104.2; plt 192; glucose 119; bun 23.1; creat 1.40; k+4.7; na++140; liver normal albumin 3.8 tsh 4.96  10-24-14: wbc 3.8; hgb 11.9; hct 35.9; mcv 100.8; plt 159; glucose 137; bun 25; creat 1.18; k+3.7; na++137 10-30-14: wbc 4.9; hgb 12.5; hct 37.4; mcv 103.6; plt 180; glucose 115; bun 22; creat 1.11; k+4.5; na++138; liver normal albumin 4.0  10-31-14:wbc 5.9; hgb 13.3; hct 40.6; mcv 106; plt 180 glucose 168; bun 23; creat 1.17; k+ 4.6; na++138 liver normal albumin 4.0; vit b12: 306; tsh 2.02;  vit d 42.5; hgb a1c 5.2 11-14-14: wbc 4.3; hgb 10.7; hct 34.5 ;mcv 107; plt 174; glucose 124; bun 24; creat 1.05; k+4.5; na++140; liver normal albumin 3.3  12-07-14: klebsiella pneumoniae: levaquin  02-04-15: wbc 9.7; hgb 11.0; hct 32.6; mcv 100.3; plt 156; glucose 185; bun 28.1; creat 4.3; na++137; liver normal albumin 3.4; influenza A/B: neg    Review of Systems  Constitutional: negative for fatigue . Negative for appetite change.  HENT: . Negative for congestion.    Respiratory: Negative  for cough, chest tightness and shortness of breath.   Cardiovascular: Negative for chest pain, palpitations and leg swelling.  Gastrointestinal: Negative for nausea, abdominal pain, diarrhea and constipation.  Musculoskeletal: Negative for myalgias and arthralgias.  Skin: Negative for pallor.  Neurological: Positive for dizziness.  Psychiatric/Behavioral: The patient is not nervous/anxious.       Physical Exam Constitutional: He is oriented to person, place, and time. He appears well-developed and well-nourished. No distress.  Eyes: Pupils are equal, round, and reactive to light.  Neck: Neck supple.  No JVD present. No thyromegaly present.  Cardiovascular: Normal rate, regular rhythm and intact distal pulses.   Respiratory: Effort normal and breath sounds normal. No respiratory distress.  GI: Soft. Bowel sounds are normal. He exhibits no distension. There is no tenderness.  Musculoskeletal: He exhibits no edema.  Is able to move all extremities Has limited range of motion in neck Has mild tenderness to palpation to lower back   Neurological: He is alert and oriented to person, place, and time.  Skin: Skin is warm and dry. He is not diaphoretic.  Psychiatric: He has a normal mood and affect.      ASSESSMENT/ PLAN:  1. Chronic back pain: with cervical osteoarthritis: his pain is presently being managed on his current regimen of: methadone 5 mg twice daily; lidoderm patch to cervical spine and left lower back and left side;  His other narcotics were stopped while he was hospitalized  Will continue to monitor his status.   2. BPH without obstruction: will continue proscar 5 mg daily and flomax 0.4 mg daily   3. Hypothyroidism: will continue synthroid 88 mcg daily his tsh is 2.02   4. GERD; will continue prilosec 20 mg daily   5. Chronic constipation: related to narcotic use: will continue colace twice daily; linzess 290 mcg daily; miralax 17 gm daily; and senna s 2 tabs nightly    6. Dementia: is without change in status; will continue exelon patch 13.3 mg daily   7. Psychosis with anxiety: will continue abilify 25 mg daily  And remeron 15 mg nightly to help with his sleep will monitor his status.   8. Hypertension: is stable is presently not on medications; will not make changes and will monitor his status.    Will check hgb a1c     Ok Edwards NP [facility]  Contact [phone] Monday through Friday 8am- 5pm  After hours call [phone]

## 2015-03-25 LAB — BASIC METABOLIC PANEL
BUN: 24 mg/dL — AB (ref 4–21)
CREATININE: 1.1 mg/dL (ref 0.6–1.3)
GLUCOSE: 88 mg/dL
POTASSIUM: 4 mmol/L (ref 3.4–5.3)
SODIUM: 144 mmol/L (ref 137–147)

## 2015-03-25 LAB — HEPATIC FUNCTION PANEL
ALT: 6 U/L — AB (ref 10–40)
AST: 11 U/L — AB (ref 14–40)
Alkaline Phosphatase: 68 U/L (ref 25–125)
Bilirubin, Total: 0.4 mg/dL

## 2015-03-25 LAB — CBC AND DIFFERENTIAL
HCT: 35 % — AB (ref 41–53)
HEMOGLOBIN: 11.4 g/dL — AB (ref 13.5–17.5)
Platelets: 149 10*3/uL — AB (ref 150–399)
WBC: 4.7 10*3/mL

## 2015-03-25 LAB — TSH: TSH: 1.27 u[IU]/mL (ref 0.41–5.90)

## 2015-04-21 ENCOUNTER — Non-Acute Institutional Stay (SKILLED_NURSING_FACILITY): Payer: Medicare Other | Admitting: Internal Medicine

## 2015-04-21 ENCOUNTER — Encounter: Payer: Self-pay | Admitting: Internal Medicine

## 2015-04-21 DIAGNOSIS — F0391 Unspecified dementia with behavioral disturbance: Secondary | ICD-10-CM

## 2015-04-21 DIAGNOSIS — E559 Vitamin D deficiency, unspecified: Secondary | ICD-10-CM | POA: Diagnosis not present

## 2015-04-21 DIAGNOSIS — N400 Enlarged prostate without lower urinary tract symptoms: Secondary | ICD-10-CM | POA: Diagnosis not present

## 2015-04-21 DIAGNOSIS — F03918 Unspecified dementia, unspecified severity, with other behavioral disturbance: Secondary | ICD-10-CM

## 2015-04-21 NOTE — Progress Notes (Signed)
MRN: [id number] Name: George Foster  Sex: male Age: 82 y.o. DOB: 1924-05-24  [place] #: [facility] Facility/Room:217A Level Of Care: SNF Provider: Inocencio Homes D Emergency Contacts: Extended Emergency Contact Information Primary Emergency Contact: Andrews,Shirley Address: [address]          [place], [place] [address] [place] Phone: [phone] Work Phone: [phone] Relation: Legal Guardian Secondary Emergency Contact: Trinda Pascal Address: [address]          [place], [place] [address] Johnnette Litter of [place] Phone: [phone] Mobile Phone: [phone] Relation: Spouse  Code Status:   Allergies: Cogentin  Chief Complaint  Patient presents with  . Medical Management of Chronic Issues    HPI: Patient is 82 y.o. male who is being seen for routine issues of BPH, dementia and Vit D deficiency.  Past Medical History  Diagnosis Date  . Dementia   . Hypertension   . Arthritis   . Hypothyroidism   . Chronic back pain   . Anxiety disorder   . Gastroesophageal reflux disease   . Polypharmacy     History of hospitalizations for altered mental status related to medications  . Chronic constipation     with fecal impactions, recurrent  . Suicidal ideation 06/06/2011  . Traumatic subdural hematoma ([place]) 10/01/2013  . Multiple facial fractures ([place]) 10/02/2013  . Basilar skull fracture ([place]) 10/02/2013  . Acute blood loss anemia 10/02/2013  . Neuropathy ([place])   . Depression   . Mood disorder ([place])   . BPH (benign prostatic hyperplasia)   . Tremor   . Osteoarthritis     Past Surgical History  Procedure Laterality Date  . Appendectomy    . Removal of nonrestorable teeth numbers 2, 4, 6, 12, 13, 18, 29        Medication List       This list is accurate as of: 04/21/15 11:59 PM.  Always use your most recent med list.               ARIPiprazole 20 MG tablet  Commonly known as:  ABILIFY  Take 20 mg by mouth daily. Take with 5 mg tab      ARIPiprazole 5 MG tablet  Commonly known as:  ABILIFY  Take 5 mg by mouth daily. Take with 20 mg tab     cyanocobalamin 1000 MCG tablet  Take 1,000 mcg by mouth daily.     diphenhydrAMINE 25 MG tablet  Commonly known as:  BENADRYL  Take 25-50 mg by mouth every 6 (six) hours as needed for allergies (allergic reaction).     finasteride 5 MG tablet  Commonly known as:  PROSCAR  Take 5 mg by mouth daily.     gabapentin 600 MG tablet  Commonly known as:  NEURONTIN  Take 600 mg by mouth 2 (two) times daily.     levothyroxine 88 MCG tablet  Commonly known as:  SYNTHROID, LEVOTHROID  Take 88 mcg by mouth daily before breakfast.     lidocaine 5 %  Commonly known as:  LIDODERM  Place 2 patches onto the skin 2 (two) times daily. Apply 1 patch to left side twice daily related to chronic pain & 1 patch to lower back twice daily for pain. Also apply to neck-cervical spine topically twice daily     LINZESS 290 MCG Caps capsule  Generic drug:  Linaclotide  Take 290 mcg by mouth every morning.     methadone 5 MG tablet  Commonly known  as:  DOLOPHINE  Take one tablet by mouth every 12 hours for pain     mirtazapine 15 MG tablet  Commonly known as:  REMERON  Take 15 mg by mouth at bedtime.     multivitamins ther. w/minerals Tabs tablet  Take 1 tablet by mouth daily.     omeprazole 40 MG capsule  Commonly known as:  PRILOSEC  Take 40 mg by mouth daily.     polyethylene glycol packet  Commonly known as:  MIRALAX / GLYCOLAX  Take 17 g by mouth daily.     predniSONE 20 MG tablet  Commonly known as:  DELTASONE  3 tabs po daily x 3 days, then 2 tabs x 3 days, then 1.5 tabs x 3 days, then 1 tab x 3 days, then 0.5 tabs x 3 days     Rivastigmine 13.3 MG/24HR Pt24  Place 1 patch onto the skin every morning.     senna-docusate 8.6-50 MG tablet  Commonly known as:  Senokot-S  Take 2 tablets by mouth at bedtime.     tamsulosin 0.4 MG Caps capsule  Commonly known as:  FLOMAX  Take  0.4 mg by mouth at bedtime.        No orders of the defined types were placed in this encounter.    Immunization History  Administered Date(s) Administered  . Influenza-Unspecified 10/08/2013  . PPD Test 10/07/2013  . Tdap 09/27/2013    Social History  Substance Use Topics  . Smoking status: Never Smoker   . Smokeless tobacco: Never Used  . Alcohol Use: No     Comment: Pt denies    Review of Systems  DATA OBTAINED: from patient, nurse GENERAL:  no fevers, fatigue, appetite changes SKIN: No itching, rash HEENT: No complaint RESPIRATORY: No cough, wheezing, SOB CARDIAC: No chest pain, palpitations, lower extremity edema  GI: No abdominal pain, No N/V/D or constipation, No heartburn or reflux  GU: No dysuria, frequency or urgency, or incontinence  MUSCULOSKELETAL: No unrelieved bone/joint pain NEUROLOGIC: No headache, dizziness  PSYCHIATRIC: No overt anxiety or sadness  Filed Vitals:   04/21/15 1301  BP: 132/86  Pulse: 66  Temp: 98.2 F (36.8 C)  Resp: 19    Physical Exam  GENERAL APPEARANCE: Alert, conversant, No acute distress  SKIN: No diaphoresis rash HEENT: Unremarkable RESPIRATORY: Breathing is even, unlabored. Lung sounds are clear   CARDIOVASCULAR: Heart RRR no murmurs, rubs or gallops. No peripheral edema  GASTROINTESTINAL: Abdomen is soft, non-tender, not distended w/ normal bowel sounds.  GENITOURINARY: Bladder non tender, not distended  MUSCULOSKELETAL: No abnormal joints or musculature NEUROLOGIC: Cranial nerves 2-12 grossly intact. Moves all extremities PSYCHIATRIC: Mood and affect appropriate to situation, no behavioral issues  Patient Active Problem List   Diagnosis Date Noted  . Vitamin D deficiency 04/21/2015  . Angioedema 01/09/2015  . Major depressive disorder, recurrent episode ([place]) 12/03/2014  . Essential hypertension, benign 11/19/2014  . Psychosis 11/19/2014  . Mood disorder ([place]) 10/31/2014  . Hereditary and idiopathic  peripheral neuropathy 07/01/2014  . Insomnia 04/19/2014  . Fall 10/02/2013  . BPH (benign prostatic hyperplasia) 02/17/2013  . Osteoarthritis cervical spine 02/17/2013  . Chronic constipation   . Chronic back pain 06/08/2011  . Dementia with behavioral disturbance 06/06/2011  . Hypothyroidism   . Gastroesophageal reflux disease   . Anxiety disorder     CBC    Component Value Date/Time   WBC 5.2 01/09/2015 1418   RBC 3.45* 01/09/2015 1418   HGB 11.7* 01/09/2015  1418   HCT 35.3* 01/09/2015 1418   PLT 143* 01/09/2015 1418   MCV 102.3* 01/09/2015 1418   LYMPHSABS 0.8 01/09/2015 1418   MONOABS 0.3 01/09/2015 1418   EOSABS 0.2 01/09/2015 1418   BASOSABS 0.0 01/09/2015 1418    CMP     Component Value Date/Time   NA 142 01/09/2015 1418   K 4.1 01/09/2015 1418   CL 111 01/09/2015 1418   CO2 25 01/09/2015 1418   GLUCOSE 141* 01/09/2015 1418   BUN 22* 01/09/2015 1418   CREATININE 1.08 01/09/2015 1418   CALCIUM 8.4* 01/09/2015 1418   PROT 6.5 10/30/2014 1627   ALBUMIN 4.0 10/30/2014 1627   AST 18 10/30/2014 1627   ALT 11* 10/30/2014 1627   ALKPHOS 66 10/30/2014 1627   BILITOT 0.6 10/30/2014 1627   GFRNONAA 59* 01/09/2015 1418   GFRAA >60 01/09/2015 1418    Assessment and Plan  Vitamin D deficiency Start Vit D 50,000 u q week for 12 weeks;repeat level in 12 weeks  BPH (benign prostatic hyperplasia) Stable without obstruction: continue proscar 5 mg daily and flomax 0.4 mg dail  Dementia with behavioral disturbance Without change in status; will continue exelon patch 13.3 mg daily      [name], MD

## 2015-04-21 NOTE — Assessment & Plan Note (Signed)
Start Vit D 50,000 u q week for 12 weeks;repeat level in 12 weeks

## 2015-04-27 ENCOUNTER — Encounter: Payer: Self-pay | Admitting: Internal Medicine

## 2015-04-27 NOTE — Assessment & Plan Note (Signed)
Without change in status; will continue exelon patch 13.3 mg daily

## 2015-04-27 NOTE — Assessment & Plan Note (Signed)
Stable without obstruction: continue proscar 5 mg daily and flomax 0.4 mg dail

## 2015-05-15 ENCOUNTER — Non-Acute Institutional Stay (SKILLED_NURSING_FACILITY): Payer: Medicare Other | Admitting: Internal Medicine

## 2015-05-15 ENCOUNTER — Encounter: Payer: Self-pay | Admitting: Internal Medicine

## 2015-05-15 DIAGNOSIS — I10 Essential (primary) hypertension: Secondary | ICD-10-CM

## 2015-05-15 DIAGNOSIS — F39 Unspecified mood [affective] disorder: Secondary | ICD-10-CM

## 2015-05-15 DIAGNOSIS — K5901 Slow transit constipation: Secondary | ICD-10-CM

## 2015-05-15 DIAGNOSIS — K5909 Other constipation: Secondary | ICD-10-CM

## 2015-05-15 DIAGNOSIS — K5900 Constipation, unspecified: Secondary | ICD-10-CM

## 2015-05-15 NOTE — Progress Notes (Signed)
MRN: [id number] Name: George Foster  Sex: male Age: 82 y.o. DOB: [birth year]  [place] #: [facility] Facility/Room: Level Of Care: SNF Provider: Inocencio Homes D Emergency Contacts: Extended Emergency Contact Information Primary Emergency Contact: Andrews,Shirley Address: [address]          [place], [place] [address] [place] Phone: [phone] Work Phone: [phone] Relation: Legal Guardian Secondary Emergency Contact: Trinda Pascal Address: [address]          [place],  [address] Johnnette Litter of [place] Phone: [phone] Mobile Phone: [phone] Relation: Spouse  Code Status:   Allergies: Cogentin  Chief Complaint  Patient presents with  . Medical Management of Chronic Issues    HPI: Patient is 82 y.o. male who is being seen for routine issues of HTN, mood disorder and constipation.  Past Medical History  Diagnosis Date  . Dementia   . Hypertension   . Arthritis   . Hypothyroidism   . Chronic back pain   . Anxiety disorder   . Gastroesophageal reflux disease   . Polypharmacy     History of hospitalizations for altered mental status related to medications  . Chronic constipation     with fecal impactions, recurrent  . Suicidal ideation 06/06/2011  . Traumatic subdural hematoma ([place]) 10/01/2013  . Multiple facial fractures ([place]) 10/02/2013  . Basilar skull fracture ([place]) 10/02/2013  . Acute blood loss anemia 10/02/2013  . Neuropathy ([place])   . Depression   . Mood disorder ([place])   . BPH (benign prostatic hyperplasia)   . Tremor   . Osteoarthritis     Past Surgical History  Procedure Laterality Date  . Appendectomy    . Removal of nonrestorable teeth numbers 2, 4, 6, 12, 13, 18, 29        Medication List       This list is accurate as of: 05/15/15 11:59 PM.  Always use your most recent med list.               ARIPiprazole 20 MG tablet  Commonly known as:  ABILIFY  Take 20 mg by mouth daily. Take with 5 mg tab     ARIPiprazole 5 MG tablet  Commonly known as:  ABILIFY  Take 5 mg by mouth daily. Take with 20 mg tab     cyanocobalamin 1000 MCG tablet  Take 1,000 mcg by mouth daily.     diphenhydrAMINE 25 MG tablet  Commonly known as:  BENADRYL  Take 25-50 mg by mouth every 6 (six) hours as needed for allergies (allergic reaction).     finasteride 5 MG tablet  Commonly known as:  PROSCAR  Take 5 mg by mouth daily.     gabapentin 600 MG tablet  Commonly known as:  NEURONTIN  Take 600 mg by mouth 2 (two) times daily.     levothyroxine 88 MCG tablet  Commonly known as:  SYNTHROID, LEVOTHROID  Take 88 mcg by mouth daily before breakfast.     lidocaine 5 %  Commonly known as:  LIDODERM  Place 2 patches onto the skin 2 (two) times daily. Apply 1 patch to left side twice daily related to chronic pain & 1 patch to lower back twice daily for pain. Also apply to neck-cervical spine topically twice daily     LINZESS 290 MCG Caps capsule  Generic drug:  Linaclotide  Take 290 mcg by mouth every morning.     methadone 5 MG tablet  Commonly known as:  DOLOPHINE  Take one tablet by mouth every 12 hours for pain     mirtazapine 15 MG tablet  Commonly known as:  REMERON  Take 15 mg by mouth at bedtime.     multivitamins ther. w/minerals Tabs tablet  Take 1 tablet by mouth daily.     omeprazole 40 MG capsule  Commonly known as:  PRILOSEC  Take 40 mg by mouth daily.     polyethylene glycol packet  Commonly known as:  MIRALAX / GLYCOLAX  Take 17 g by mouth daily.     predniSONE 20 MG tablet  Commonly known as:  DELTASONE  3 tabs po daily x 3 days, then 2 tabs x 3 days, then 1.5 tabs x 3 days, then 1 tab x 3 days, then 0.5 tabs x 3 days     Rivastigmine 13.3 MG/24HR Pt24  Place 1 patch onto the skin every morning.     senna-docusate 8.6-50 MG tablet  Commonly known as:  Senokot-S  Take 2 tablets by mouth at bedtime.     tamsulosin 0.4 MG Caps capsule  Commonly known as:  FLOMAX  Take 0.4  mg by mouth at bedtime.        No orders of the defined types were placed in this encounter.    Immunization History  Administered Date(s) Administered  . Influenza-Unspecified 10/08/2013  . PPD Test 10/07/2013  . Tdap 09/27/2013    Social History  Substance Use Topics  . Smoking status: Never Smoker   . Smokeless tobacco: Never Used  . Alcohol Use: No     Comment: Pt denies    Review of Systems  DATA OBTAINED: from patient, nurse GENERAL:  no fevers, fatigue, appetite changes SKIN: No itching, rash HEENT: No complaint RESPIRATORY: No cough, wheezing, SOB CARDIAC: No chest pain, palpitations, lower extremity edema  GI: No abdominal pain, No N/V/D No heartburn or reflux ; pt c/o one big normal BM q 3 days but it comes on so fast he can't get to BR in time GU: No dysuria, frequency or urgency, or incontinence  MUSCULOSKELETAL: No unrelieved bone/joint pain NEUROLOGIC: No headache, dizziness  PSYCHIATRIC: No overt anxiety or sadness  Filed Vitals:   05/15/15 2112  BP: 135/78  Pulse: 72  Temp: 98.4 F (36.9 C)  Resp: 18    Physical Exam  GENERAL APPEARANCE: Alert, conversant, No acute distress  SKIN: No diaphoresis rash, or wounds HEENT: Unremarkable RESPIRATORY: Breathing is even, unlabored. Lung sounds are clear   CARDIOVASCULAR: Heart RRR no murmurs, rubs or gallops. No peripheral edema  GASTROINTESTINAL: Abdomen is soft, non-tender, not distended w/ normal bowel sounds.  GENITOURINARY: Bladder non tender, not distended  MUSCULOSKELETAL: No abnormal joints or musculature NEUROLOGIC: Cranial nerves 2-12 grossly intact. Moves all extremities PSYCHIATRIC: Mood and affect appropriate to situation, no behavioral issues  Patient Active Problem List   Diagnosis Date Noted  . Vitamin D deficiency 04/21/2015  . Angioedema 01/09/2015  . Major depressive disorder, recurrent episode ([place]) 12/03/2014  . Essential hypertension, benign 11/19/2014  . Psychosis  11/19/2014  . Mood disorder ([place]) 10/31/2014  . Hereditary and idiopathic peripheral neuropathy 07/01/2014  . Insomnia 04/19/2014  . Fall 10/02/2013  . BPH (benign prostatic hyperplasia) 02/17/2013  . Osteoarthritis cervical spine 02/17/2013  . Chronic constipation   . Chronic back pain 06/08/2011  . Dementia with behavioral disturbance 06/06/2011  . Hypothyroidism   . Gastroesophageal reflux disease   . Anxiety disorder     CBC  Component Value Date/Time   WBC 5.2 01/09/2015 1418   RBC 3.45* 01/09/2015 1418   HGB 11.7* 01/09/2015 1418   HCT 35.3* 01/09/2015 1418   PLT 143* 01/09/2015 1418   MCV 102.3* 01/09/2015 1418   LYMPHSABS 0.8 01/09/2015 1418   MONOABS 0.3 01/09/2015 1418   EOSABS 0.2 01/09/2015 1418   BASOSABS 0.0 01/09/2015 1418    CMP     Component Value Date/Time   NA 142 01/09/2015 1418   K 4.1 01/09/2015 1418   CL 111 01/09/2015 1418   CO2 25 01/09/2015 1418   GLUCOSE 141* 01/09/2015 1418   BUN 22* 01/09/2015 1418   CREATININE 1.08 01/09/2015 1418   CALCIUM 8.4* 01/09/2015 1418   PROT 6.5 10/30/2014 1627   ALBUMIN 4.0 10/30/2014 1627   AST 18 10/30/2014 1627   ALT 11* 10/30/2014 1627   ALKPHOS 66 10/30/2014 1627   BILITOT 0.6 10/30/2014 1627   GFRNONAA 59* 01/09/2015 1418   GFRAA >60 01/09/2015 1418    Assessment and Plan  Essential hypertension, benign stable is presently not on medications; will not make changes and will monitor his status.    Mood disorder : will continue abilify 25 mg daily And remeron 15 mg nightly to help with his sleep will monitor his status.   Chronic constipation Pt says he has one big BM every 3 days that comes on so fast he can't get to the BR; with his chronic narcotic use thetre may be no way around this but I have sttarted by decreasing his senokot -s from 2 BID to one  BID    [name], MD

## 2015-05-20 ENCOUNTER — Encounter: Payer: Self-pay | Admitting: Internal Medicine

## 2015-05-20 DIAGNOSIS — K5900 Constipation, unspecified: Secondary | ICD-10-CM | POA: Insufficient documentation

## 2015-05-20 NOTE — Assessment & Plan Note (Signed)
Pt says he has one big BM every 3 days that comes on so fast he can't get to the BR; with his chronic narcotic use thetre may be no way around this but I have sttarted by decreasing his senokot -s from 2 BID to one  BID

## 2015-05-20 NOTE — Assessment & Plan Note (Signed)
:   will continue abilify 25 mg daily And remeron 15 mg nightly to help with his sleep will monitor his status.

## 2015-05-20 NOTE — Assessment & Plan Note (Signed)
stable is presently not on medications; will not make changes and will monitor his status.

## 2015-06-09 ENCOUNTER — Other Ambulatory Visit: Payer: Self-pay | Admitting: Internal Medicine

## 2015-06-09 DIAGNOSIS — N6320 Unspecified lump in the left breast, unspecified quadrant: Secondary | ICD-10-CM

## 2015-06-09 DIAGNOSIS — N6459 Other signs and symptoms in breast: Secondary | ICD-10-CM

## 2015-06-13 ENCOUNTER — Ambulatory Visit
Admission: RE | Admit: 2015-06-13 | Discharge: 2015-06-13 | Disposition: A | Discharge status: Home / Self Care | Payer: Medicare Other | Source: Ambulatory Visit | Attending: Internal Medicine | Admitting: Internal Medicine

## 2015-06-13 ENCOUNTER — Other Ambulatory Visit: Payer: Self-pay | Admitting: Internal Medicine

## 2015-06-13 DIAGNOSIS — N6320 Unspecified lump in the left breast, unspecified quadrant: Secondary | ICD-10-CM

## 2015-06-13 DIAGNOSIS — N6459 Other signs and symptoms in breast: Secondary | ICD-10-CM

## 2015-06-18 ENCOUNTER — Ambulatory Visit
Admission: RE | Admit: 2015-06-18 | Discharge: 2015-06-18 | Disposition: A | Discharge status: Home / Self Care | Payer: Medicare Other | Source: Ambulatory Visit | Attending: Internal Medicine | Admitting: Internal Medicine

## 2015-06-18 ENCOUNTER — Encounter: Payer: Self-pay | Admitting: Adult Health

## 2015-06-18 ENCOUNTER — Non-Acute Institutional Stay (SKILLED_NURSING_FACILITY): Payer: Medicare Other | Admitting: Adult Health

## 2015-06-18 ENCOUNTER — Other Ambulatory Visit: Payer: Self-pay | Admitting: Internal Medicine

## 2015-06-18 DIAGNOSIS — M47812 Spondylosis without myelopathy or radiculopathy, cervical region: Secondary | ICD-10-CM

## 2015-06-18 DIAGNOSIS — N6459 Other signs and symptoms in breast: Secondary | ICD-10-CM

## 2015-06-18 DIAGNOSIS — F03918 Unspecified dementia, unspecified severity, with other behavioral disturbance: Secondary | ICD-10-CM

## 2015-06-18 DIAGNOSIS — K5900 Constipation, unspecified: Secondary | ICD-10-CM

## 2015-06-18 DIAGNOSIS — I10 Essential (primary) hypertension: Secondary | ICD-10-CM | POA: Diagnosis not present

## 2015-06-18 DIAGNOSIS — N6320 Unspecified lump in the left breast, unspecified quadrant: Secondary | ICD-10-CM

## 2015-06-18 DIAGNOSIS — M549 Dorsalgia, unspecified: Secondary | ICD-10-CM | POA: Diagnosis not present

## 2015-06-18 DIAGNOSIS — G8929 Other chronic pain: Secondary | ICD-10-CM | POA: Diagnosis not present

## 2015-06-18 DIAGNOSIS — N63 Unspecified lump in breast: Secondary | ICD-10-CM

## 2015-06-18 DIAGNOSIS — F333 Major depressive disorder, recurrent, severe with psychotic symptoms: Secondary | ICD-10-CM

## 2015-06-18 DIAGNOSIS — N400 Enlarged prostate without lower urinary tract symptoms: Secondary | ICD-10-CM

## 2015-06-18 DIAGNOSIS — K219 Gastro-esophageal reflux disease without esophagitis: Secondary | ICD-10-CM | POA: Diagnosis not present

## 2015-06-18 DIAGNOSIS — K5909 Other constipation: Secondary | ICD-10-CM

## 2015-06-18 DIAGNOSIS — G609 Hereditary and idiopathic neuropathy, unspecified: Secondary | ICD-10-CM | POA: Diagnosis not present

## 2015-06-18 DIAGNOSIS — F0391 Unspecified dementia with behavioral disturbance: Secondary | ICD-10-CM

## 2015-06-18 LAB — OTHER LAB RESULT
VIT D 25 HYDROXY: 22.37
Vitamin B-12: 440

## 2015-06-18 NOTE — Progress Notes (Signed)
Patient ID: George Foster, male   DOB: 08/24/1924, 83 y.o.   MRN: [id number]   Facility:  [facility]       Allergies  Allergen Reactions  . Cogentin [Benztropine]     unkown    Chief Complaint  Patient presents with  . Medical Management of Chronic Issues    Follow up    HPI:  He is a long term resident of this facility being seen for the management of his chronic illnesses. He has a left breast biopsy today for a suspicious nodule. He tells me that he is doing ok; but he feels sleepy. There are no nursing concerns at this time.      Past Medical History  Diagnosis Date  . Dementia   . Hypertension   . Arthritis   . Hypothyroidism   . Chronic back pain   . Anxiety disorder   . Gastroesophageal reflux disease   . Polypharmacy     History of hospitalizations for altered mental status related to medications  . Chronic constipation     with fecal impactions, recurrent  . Suicidal ideation 06/06/2011  . Traumatic subdural hematoma ([place]) 10/01/2013  . Multiple facial fractures ([place]) 10/02/2013  . Basilar skull fracture ([place]) 10/02/2013  . Acute blood loss anemia 10/02/2013  . Neuropathy ([place])   . Depression   . Mood disorder ([place])   . BPH (benign prostatic hyperplasia)   . Tremor   . Osteoarthritis     Past Surgical History  Procedure Laterality Date  . Appendectomy    . Removal of nonrestorable teeth numbers 2, 4, 6, 12, 13, 18, 29      VITAL SIGNS BP 140/78 mmHg  Pulse 70  Temp(Src) 98.4 F (36.9 C) (Oral)  Resp 18  Ht 5\' 7"  (1.702 m)  Wt 188 lb (85.276 kg)  BMI 29.44 kg/m2  SpO2 96%  Patient's Medications  New Prescriptions   No medications on file  Previous Medications   ARIPIPRAZOLE (ABILIFY) 20 MG TABLET    Take 20 mg by mouth daily.    CYANOCOBALAMIN 1000 MCG TABLET    Take 1,000 mcg by mouth daily.    ERGOCALCIFEROL (VITAMIN D2) 50000 UNITS CAPSULE    Take 50,000 Units by mouth once a week. Every monday   FINASTERIDE (PROSCAR) 5 MG TABLET    Take  5 mg by mouth daily.   GABAPENTIN (NEURONTIN) 600 MG TABLET    Take 600 mg by mouth 2 (two) times daily.   LEVOTHYROXINE (SYNTHROID, LEVOTHROID) 88 MCG TABLET    Take 88 mcg by mouth daily before breakfast.    LINACLOTIDE (LINZESS) 290 MCG CAPS CAPSULE    Take 290 mcg by mouth every morning.    METHADONE (DOLOPHINE) 5 MG TABLET    Take one tablet by mouth every 12 hours for pain   MIRTAZAPINE (REMERON) 15 MG TABLET    Take 15 mg by mouth at bedtime.   MULTIPLE VITAMINS-MINERALS (MULTIVITAMINS THER. W/MINERALS) TABS    Take 1 tablet by mouth daily.   OMEPRAZOLE (PRILOSEC) 40 MG CAPSULE    Take 40 mg by mouth daily.   POLYETHYLENE GLYCOL (MIRALAX / GLYCOLAX) PACKET    Take 17 g by mouth daily.   RIVASTIGMINE 13.3 MG/24HR PT24    Place 1 patch onto the skin every morning.   SENNA-DOCUSATE (SENOKOT-S) 8.6-50 MG PER TABLET    Take 2 tablets by mouth at bedtime.   TAMSULOSIN (FLOMAX) 0.4 MG CAPS    Take 0.4  mg by mouth at bedtime.   Modified Medications   No medications on file  Discontinued Medications     SIGNIFICANT DIAGNOSTIC EXAMS  10-24-14: chest x-ray: Mild scarring left base. No edema or consolidation. No pneumothorax evident  10-24-14:lumbar spine x-ray: Multilevel osteoarthritic change. Scoliosis. No acute fracture or spondylolisthesis.  10-24-14 thoracic spine x-ray: No acute fracture or listhesis identified in the thoracic spine. Multilevel degenerative disease, moderate to severe.  10-24-14: ct of head and cervical spine:  CT HEAD:  Remote left zygomatic arch fracture.  No acute calvarial fracture. No intracranial hemorrhage. Small vessel disease type changes without CT evidence of large acute Infarct. Atrophy without hydrocephalus.   CT CERVICAL SPINE: No cervical spine fracture. Cervical spondylotic changes with spinal stenosis most notable C5-6 with mild cord compression greater on the left.  10-30-14: chest x-ray: Mild scarring left base.  No edema or consolidation.  02-04-15:  chest x-ray; negative for acute cardiopulmonary disease   06-09-15: left breat ultrasound: solid mass left breast 1.9 x 1.9 x 2.5 cm suspicious of malignancy   06-13-15: diagnostic mammogram: Highly suspicious 2 cm retroareolar left breast mass -tissue sampling is recommended.  06-18-15: left breast biopsy: Ultrasound guided biopsy of a subareolar left breast mass. No apparent complications.       LABS REVIEWED:   08-29-14: wbc 6.6 hgb 11.9; hct 38.3; mcv 104.2; plt 192; glucose 119; bun 23.1; creat 1.40; k+4.7; na++140; liver normal albumin 3.8 tsh 4.96  10-24-14: wbc 3.8; hgb 11.9; hct 35.9; mcv 100.8; plt 159; glucose 137; bun 25; creat 1.18; k+3.7; na++137 10-30-14: wbc 4.9; hgb 12.5; hct 37.4; mcv 103.6; plt 180; glucose 115; bun 22; creat 1.11; k+4.5; na++138; liver normal albumin 4.0  10-31-14:wbc 5.9; hgb 13.3; hct 40.6; mcv 106; plt 180 glucose 168; bun 23; creat 1.17; k+ 4.6; na++138 liver normal albumin 4.0; vit b12: 306; tsh 2.02;  vit d 42.5; hgb a1c 5.2 11-14-14: wbc 4.3; hgb 10.7; hct 34.5 ;mcv 107; plt 174; glucose 124; bun 24; creat 1.05; k+4.5; na++140; liver normal albumin 3.3  12-07-14: klebsiella pneumoniae: levaquin  02-04-15: wbc 9.7; hgb 11.0; hct 32.6; mcv 100.3; plt 156; glucose 185; bun 28.1;creat1.5  k+4.3; na++137; liver normal albumin 3.4; influenza A/B: neg 03-26-15: wbc 4.7; hgb 11.4; hct 35; plt 149; glucose 88; bun 24; creat 1.1; k+ 4.0; na++144 tsh 1.27; vit d 22.37; vit B12 440     Review of Systems  Constitutional: negative for fatigue . Negative for appetite change.  HENT: . Negative for congestion.    Respiratory: Negative for cough, chest tightness and shortness of breath.   Cardiovascular: Negative for chest pain, palpitations and leg swelling.  Gastrointestinal: Negative for nausea, abdominal pain, diarrhea and constipation.  Musculoskeletal: Negative for myalgias and arthralgias.  Skin: Negative for pallor.  Neurological: negative for vertigo    Psychiatric/Behavioral: The patient is not nervous/anxious.       Physical Exam Constitutional: He is oriented to person, place, and time. He appears well-developed and well-nourished. No distress.  Eyes: Pupils are equal, round, and reactive to light.  Neck: Neck supple. No JVD present. No thyromegaly present.  Cardiovascular: Normal rate, regular rhythm and intact distal pulses.   Respiratory: Effort normal and breath sounds normal. No respiratory distress.  GI: Soft. Bowel sounds are normal. He exhibits no distension. There is no tenderness.  Musculoskeletal: He exhibits no edema.  Is able to move all extremities Has limited range of motion in neck Has mild tenderness to palpation to  lower back   Neurological: He is alert and oriented to person, place, and time.  Skin: Skin is warm and dry. He is not diaphoretic.  Psychiatric: He has a normal mood and affect.      ASSESSMENT/ PLAN:  1. Chronic back pain: with cervical osteoarthritis: his pain is presently being managed on his current regimen of: methadone 5 mg twice daily; lidoderm patch to cervical spine and left lower back and left side;  His other narcotics were stopped while he was hospitalized  Will continue to monitor his status.   2. BPH without obstruction: will continue proscar 5 mg daily and flomax 0.4 mg daily   3. Hypothyroidism: will continue synthroid 88 mcg daily his tsh is 2.02   4. GERD; will continue prilosec 20 mg daily   5. Chronic constipation: related to narcotic use: will continue colace twice daily; linzess 290 mcg daily; miralax 17 gm daily; and senna s 2 tabs nightly   6. Dementia: is without change in status; will continue exelon patch 13.3 mg daily   7. Psychosis with anxiety: will continue abilify 20 mg daily  And remeron 15 mg nightly to help with his sleep will monitor his status.   8. Hypertension: is stable is presently not on medications; will not make changes and will monitor his status.    9. Left breast mass: is status post left breast biopsy; will await result and will monitor     Ok Edwards NP [facility]  Contact [phone] Monday through Friday 8am- 5pm  After hours call [phone]

## 2015-06-19 ENCOUNTER — Encounter: Payer: Self-pay | Admitting: Internal Medicine

## 2015-06-19 NOTE — Progress Notes (Signed)
MRN: [id number] Name: George Foster  Sex: male Age: 82 y.o. DOB: 01-Aug-1924  [place] #:  Facility/Room: Level Of Care: SNF Provider: Inocencio Homes D Emergency Contacts: Extended Emergency Contact Information Primary Emergency Contact: Andrews,Shirley Address: [address]          [place], [place] [address] [place] Phone: [phone] Work Phone: [phone] Relation: Legal Guardian Secondary Emergency Contact: Trinda Pascal Address: [address]          [place], [place] [address] [place] Phone: [phone] Mobile Phone: [phone] Relation: Spouse  Code Status:   Allergies: Cogentin  Chief Complaint  Patient presents with  . Acute Visit    HPI: Patient is 82 y.o. male who   Past Medical History  Diagnosis Date  . Dementia   . Hypertension   . Arthritis   . Hypothyroidism   . Chronic back pain   . Anxiety disorder   . Gastroesophageal reflux disease   . Polypharmacy     History of hospitalizations for altered mental status related to medications  . Chronic constipation     with fecal impactions, recurrent  . Suicidal ideation 06/06/2011  . Traumatic subdural hematoma ([place]) 10/01/2013  . Multiple facial fractures ([place]) 10/02/2013  . Basilar skull fracture ([place]) 10/02/2013  . Acute blood loss anemia 10/02/2013  . Neuropathy ([place])   . Depression   . Mood disorder ([place])   . BPH (benign prostatic hyperplasia)   . Tremor   . Osteoarthritis     Past Surgical History  Procedure Laterality Date  . Appendectomy    . Removal of nonrestorable teeth numbers 2, 4, 6, 12, 13, 18, 29        Medication List       This list is accurate as of: 06/19/15  1:47 PM.  Always use your most recent med list.               ARIPiprazole 20 MG tablet  Commonly known as:  ABILIFY  Take 20 mg by mouth daily.     cyanocobalamin 1000 MCG tablet  Take 1,000 mcg by mouth daily.     ergocalciferol 50000 units capsule  Commonly known as:   VITAMIN D2  Take 50,000 Units by mouth once a week. Every monday     finasteride 5 MG tablet  Commonly known as:  PROSCAR  Take 5 mg by mouth daily.     gabapentin 600 MG tablet  Commonly known as:  NEURONTIN  Take 600 mg by mouth 2 (two) times daily.     levothyroxine 88 MCG tablet  Commonly known as:  SYNTHROID, LEVOTHROID  Take 88 mcg by mouth daily before breakfast.     LINZESS 290 MCG Caps capsule  Generic drug:  Linaclotide  Take 290 mcg by mouth every morning.     methadone 5 MG tablet  Commonly known as:  DOLOPHINE  Take one tablet by mouth every 12 hours for pain     mirtazapine 15 MG tablet  Commonly known as:  REMERON  Take 15 mg by mouth at bedtime.     multivitamins ther. w/minerals Tabs tablet  Take 1 tablet by mouth daily.     omeprazole 40 MG capsule  Commonly known as:  PRILOSEC  Take 40 mg by mouth daily.     polyethylene glycol packet  Commonly known as:  MIRALAX / GLYCOLAX  Take 17 g by mouth daily.     Rivastigmine 13.3 MG/24HR Pt24  Place 1 patch onto the skin every morning.     senna-docusate 8.6-50 MG tablet  Commonly known as:  Senokot-S  Take 2 tablets by mouth at bedtime.     tamsulosin 0.4 MG Caps capsule  Commonly known as:  FLOMAX  Take 0.4 mg by mouth at bedtime.        No orders of the defined types were placed in this encounter.    Immunization History  Administered Date(s) Administered  . Influenza-Unspecified 10/08/2013  . PPD Test 10/07/2013  . Tdap 09/27/2013    Social History  Substance Use Topics  . Smoking status: Never Smoker   . Smokeless tobacco: Never Used  . Alcohol Use: No     Comment: Pt denies    Review of Systems  DATA OBTAINED: from patient, nurse, medical record, family member GENERAL:  no fevers, fatigue, appetite changes SKIN: No itching, rash HEENT: No complaint RESPIRATORY: No cough, wheezing, SOB CARDIAC: No chest pain, palpitations, lower extremity edema  GI: No abdominal pain, No  N/V/D or constipation, No heartburn or reflux  GU: No dysuria, frequency or urgency, or incontinence  MUSCULOSKELETAL: No unrelieved bone/joint pain NEUROLOGIC: No headache, dizziness  PSYCHIATRIC: No overt anxiety or sadness  Filed Vitals:   06/19/15 1345  BP: 140/78  Pulse: 70  Temp: 98.4 F (36.9 C)  Resp: 18    Physical Exam  GENERAL APPEARANCE: Alert, conversant, No acute distress  SKIN: No diaphoresis rash, or wounds HEENT: Unremarkable RESPIRATORY: Breathing is even, unlabored. Lung sounds are clear   CARDIOVASCULAR: Heart RRR no murmurs, rubs or gallops. No peripheral edema  GASTROINTESTINAL: Abdomen is soft, non-tender, not distended w/ normal bowel sounds.  GENITOURINARY: Bladder non tender, not distended  MUSCULOSKELETAL: No abnormal joints or musculature NEUROLOGIC: Cranial nerves 2-12 grossly intact. Moves all extremities PSYCHIATRIC: Mood and affect appropriate to situation, no behavioral issues  Patient Active Problem List   Diagnosis Date Noted  . Left breast mass 06/18/2015  . Vitamin D deficiency 04/21/2015  . Angioedema 01/09/2015  . Major depressive disorder, recurrent episode ([place]) 12/03/2014  . Essential hypertension, benign 11/19/2014  . Psychosis 11/19/2014  . Mood disorder ([place]) 10/31/2014  . Hereditary and idiopathic peripheral neuropathy 07/01/2014  . Insomnia 04/19/2014  . Fall 10/02/2013  . BPH (benign prostatic hyperplasia) 02/17/2013  . Osteoarthritis cervical spine 02/17/2013  . Chronic constipation   . Chronic back pain 06/08/2011  . Dementia with behavioral disturbance 06/06/2011  . Hypothyroidism   . Gastroesophageal reflux disease   . Anxiety disorder     CBC    Component Value Date/Time   WBC 4.7 03/25/2015   WBC 5.2 01/09/2015 1418   RBC 3.45* 01/09/2015 1418   HGB 11.4* 03/25/2015   HCT 35* 03/25/2015   PLT 149* 03/25/2015   MCV 102.3* 01/09/2015 1418   LYMPHSABS 0.8 01/09/2015 1418   MONOABS 0.3 01/09/2015 1418    EOSABS 0.2 01/09/2015 1418   BASOSABS 0.0 01/09/2015 1418    CMP     Component Value Date/Time   NA 144 03/25/2015   NA 142 01/09/2015 1418   K 4.0 03/25/2015   CL 111 01/09/2015 1418   CO2 25 01/09/2015 1418   GLUCOSE 141* 01/09/2015 1418   BUN 24* 03/25/2015   BUN 22* 01/09/2015 1418   CREATININE 1.1 03/25/2015   CREATININE 1.08 01/09/2015 1418   CALCIUM 8.4* 01/09/2015 1418   PROT 6.5 10/30/2014 1627   ALBUMIN 4.0 10/30/2014 1627   AST 11* 03/25/2015   ALT  6* 03/25/2015   ALKPHOS 68 03/25/2015   BILITOT 0.6 10/30/2014 1627   GFRNONAA 59* 01/09/2015 1418   GFRAA >60 01/09/2015 1418    Assessment and Plan  No problem-specific assessment & plan notes found for this encounter.   [name], MD     This encounter was created in error - please disregard.

## 2015-06-30 ENCOUNTER — Telehealth: Payer: Self-pay | Admitting: Oncology

## 2015-06-30 NOTE — Telephone Encounter (Signed)
Incoming referral from [facility]. Spoke with patient wife [name] [phone]) and Joselyn at [facility] [phone]) re new patient appointment with [facility] 07/09/2015 @ 4 pm to arrive 3:30 pm. Patient demographic information and insurance information confirmed with patient wife and Melida Quitter in the business office at [employer]. Per Thayer Headings patient has medicare/medicaid - the [facility] is for pharmacy only.

## 2015-07-01 ENCOUNTER — Other Ambulatory Visit: Payer: Self-pay | Admitting: Oncology

## 2015-07-08 ENCOUNTER — Other Ambulatory Visit: Payer: Self-pay

## 2015-07-09 ENCOUNTER — Other Ambulatory Visit: Payer: Self-pay

## 2015-07-09 ENCOUNTER — Ambulatory Visit (HOSPITAL_BASED_OUTPATIENT_CLINIC_OR_DEPARTMENT_OTHER): Payer: Medicare Other | Admitting: Oncology

## 2015-07-09 VITALS — BP 151/61 | HR 73 | Temp 98.0°F | Resp 18 | Ht 67.0 in

## 2015-07-09 DIAGNOSIS — Z85828 Personal history of other malignant neoplasm of skin: Secondary | ICD-10-CM | POA: Diagnosis not present

## 2015-07-09 DIAGNOSIS — C50822 Malignant neoplasm of overlapping sites of left male breast: Secondary | ICD-10-CM | POA: Insufficient documentation

## 2015-07-09 DIAGNOSIS — C50022 Malignant neoplasm of nipple and areola, left male breast: Secondary | ICD-10-CM

## 2015-07-09 NOTE — Progress Notes (Signed)
[place]  [phone] Fax:([phone]) [phone]     ID: [name] DOB: 04/06/1924  MR#: [id number]  GOT#:[id number]  Patient Care Team: [name], MD as PCP - General (Internal Medicine) Gerlene Fee, NP as Nurse Practitioner (Nurse Practitioner) [place] ([place]) Chauncey Cruel, MD as Consulting Physician (Oncology) Fanny Skates, MD as Consulting Physician (General Surgery) PCP: [name], MD OTHER MD:  CHIEF COMPLAINT: Estrogen receptor positive left breast cancer  CURRENT TREATMENT: Tamoxifen; awaiting definitive surgery   BREAST CANCER HISTORY: George Foster tells me there has been some change in his left breast for several months and this was eventually brought to medical attention and on 06/13/2015 he underwent bilateral diagnostic mammography with tomosynthesis and left breast ultrasonography. The breast density was category 8. On the left side there was nipple retraction and increased density in the retroareolar left breast. The right breast was unremarkable. On physical exam the left nipple again was retracted and there was a hard retroareolar mass. On ultrasound this mass measured 2.0 cm. There were no abnormal left axillary lymph nodes. Evaluation was technically difficult low as the patient could not fully abductor his left arm.  Biopsy of the left breast mass in question 06/18/2015 showed ([id number]) an invasive ductal carcinoma, grade 2, estrogen receptor 90% positive, progesterone receptor 80% positive, both with strong staining intensity, with an MIB-1 of 60%, and no HER-2 amplification, the signals ratio being 1.65 and the number per cell 2.15.  The patient's subsequent history is as detailed below  INTERVAL HISTORY: George Foster was evaluated in the breast clinic for 19 2017 accompanied by his wife George Foster. His case was also presented in the multidisciplinary breast cancer conference that  same morning. At that time a preliminary plan was proposed: Surgical consultation, genetics referral, and anti-estrogens  REVIEW OF SYSTEMS: The patient complains of joint pains which make it difficult for him to walk. In fact he can't ambulate. He also complains of constipation. He had a skin cancer removed from his back. He has some hearing loss. He has some sinus problems. He has no teeth, which interferes with his eating. He has a little bit of a dry cough which is a new problem. He feels depressed. He is forgetful. A detailed review of systems today was otherwise noncontributory  PAST MEDICAL HISTORY: Past Medical History  Diagnosis Date  . Dementia   . Hypertension   . Arthritis   . Hypothyroidism   . Chronic back pain   . Anxiety disorder   . Gastroesophageal reflux disease   . Polypharmacy     History of hospitalizations for altered mental status related to medications  . Chronic constipation     with fecal impactions, recurrent  . Suicidal ideation 06/06/2011  . Traumatic subdural hematoma ([place]) 10/01/2013  . Multiple facial fractures ([place]) 10/02/2013  . Basilar skull fracture ([place]) 10/02/2013  . Acute blood loss anemia 10/02/2013  . Neuropathy ([place])   . Depression   . Mood disorder ([place])   . BPH (benign prostatic hyperplasia)   . Tremor   . Osteoarthritis     PAST SURGICAL HISTORY: Past Surgical History  Procedure Laterality Date  . Appendectomy    . Removal of nonrestorable teeth numbers 2, 4, 6, 12, 13, 18, 29      FAMILY HISTORY Family History  Problem Relation Age of Onset  . Heart disease Father   The patient's father died at the age  of 91 from heart disease. He apparently had no sisters. The patient's mother died from complications of diabetes at the age of 47. The patient has one brother, and one sister, both still living. There is no history of breast or ovarian cancer in the family to his knowledge.  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  He currently resides at [facility]. He has 2 children from an earlier marriage, George Foster and George Foster. George Foster works as a Statistician and George Foster is retired from working for [employer]. Both live in [place]. The patient has "no telling" how many grandchildren. His wife of 58 years, George Foster, is originally from [place]. She works at [employer]. She has 2 children of her own, George Foster and George Foster. Pedis and posterior and done as a roofer. Both live in [place]. She has or grandchildren of her own.    ADVANCED DIRECTIVES: DO NOT RESUSCITATE in place   HEALTH MAINTENANCE: Social History  Substance Use Topics  . Smoking status: Never Smoker   . Smokeless tobacco: Never Used  . Alcohol Use: No     Comment: Pt denies     Colonoscopy:  PSA:  Bone density:  Lipid panel:  Allergies  Allergen Reactions  . Cogentin [Benztropine]     unkown    Current Outpatient Prescriptions  Medication Sig Dispense Refill  . ARIPiprazole (ABILIFY) 20 MG tablet Take 20 mg by mouth daily.     . cyanocobalamin 1000 MCG tablet Take 1,000 mcg by mouth daily.     . ergocalciferol (VITAMIN D2) 50000 units capsule Take 50,000 Units by mouth once a week. Every monday    . finasteride (PROSCAR) 5 MG tablet Take 5 mg by mouth daily.    Marland Kitchen gabapentin (NEURONTIN) 600 MG tablet Take 600 mg by mouth 2 (two) times daily.    Marland Kitchen levothyroxine (SYNTHROID, LEVOTHROID) 88 MCG tablet Take 88 mcg by mouth daily before breakfast.     . Linaclotide (LINZESS) 290 MCG CAPS capsule Take 290 mcg by mouth every morning.     . methadone (DOLOPHINE) 5 MG tablet Take one tablet by mouth every 12 hours for pain 60 tablet 0  . mirtazapine (REMERON) 15 MG tablet Take 15 mg by mouth at bedtime.    . Multiple Vitamins-Minerals (MULTIVITAMINS THER. W/MINERALS) TABS Take 1 tablet by mouth daily.    Marland Kitchen omeprazole (PRILOSEC) 40 MG capsule Take 40 mg by mouth daily.    . polyethylene glycol (MIRALAX /  GLYCOLAX) packet Take 17 g by mouth daily. 14 each 11  . Rivastigmine 13.3 MG/24HR PT24 Place 1 patch onto the skin every morning.    . senna-docusate (SENOKOT-S) 8.6-50 MG per tablet Take 2 tablets by mouth at bedtime. (Patient taking differently: Take 1 tablet by mouth at bedtime. ) 90 tablet 11  . tamsulosin (FLOMAX) 0.4 MG CAPS Take 0.4 mg by mouth at bedtime.      No current facility-administered medications for this visit.    OBJECTIVE: Older white male examined in a wheelchair Filed Vitals:   07/09/15 1612  BP: 151/61  Pulse: 73  Temp: 98 F (36.7 C)  Resp: 18     There is no weight on file to calculate BMI.    ECOG FS:2 - Symptomatic, <50% confined to bed  Ocular: Sclerae unicteric, pupils equal and round Ear-nose-throat: Oropharynx is edentulous Lymphatic: No cervical or supraclavicular adenopathy Lungs no rales or rhonchi Heart regular rate and rhythm Abd  soft, nontender, positive bowel sounds MSK no focal spinal tenderness, limited range of motion in both upper extremities Neuro: non-focal, well oriented (not formally tested), appropriate affect Breasts: The right breast is unremarkable. The left breast shows nipple retraction, imaged below. There is a palpable mass subjacent to the nipple. It measures by palpation along over 2 cm. I do not palpate any axillary masses bilaterally  Photo of left breast 07/09/2015    LAB RESULTS:  CMP     Component Value Date/Time   NA 144 03/25/2015   NA 142 01/09/2015 1418   K 4.0 03/25/2015   CL 111 01/09/2015 1418   CO2 25 01/09/2015 1418   GLUCOSE 141* 01/09/2015 1418   BUN 24* 03/25/2015   BUN 22* 01/09/2015 1418   CREATININE 1.1 03/25/2015   CREATININE 1.08 01/09/2015 1418   CALCIUM 8.4* 01/09/2015 1418   PROT 6.5 10/30/2014 1627   ALBUMIN 4.0 10/30/2014 1627   AST 11* 03/25/2015   ALT 6* 03/25/2015   ALKPHOS 68 03/25/2015   BILITOT 0.6 10/30/2014 1627   GFRNONAA 59* 01/09/2015 1418   GFRAA >60 01/09/2015 1418     INo results Foster for: SPEP, UPEP  Lab Results  Component Value Date   WBC 4.7 03/25/2015   NEUTROABS 3.9 01/09/2015   HGB 11.4* 03/25/2015   HCT 35* 03/25/2015   MCV 102.3* 01/09/2015   PLT 149* 03/25/2015      Chemistry      Component Value Date/Time   NA 144 03/25/2015   NA 142 01/09/2015 1418   K 4.0 03/25/2015   CL 111 01/09/2015 1418   CO2 25 01/09/2015 1418   BUN 24* 03/25/2015   BUN 22* 01/09/2015 1418   CREATININE 1.1 03/25/2015   CREATININE 1.08 01/09/2015 1418   GLU 88 03/25/2015      Component Value Date/Time   CALCIUM 8.4* 01/09/2015 1418   ALKPHOS 68 03/25/2015   AST 11* 03/25/2015   ALT 6* 03/25/2015   BILITOT 0.6 10/30/2014 1627       No results Foster for: LABCA2  No components Foster for: [id number]  No results for input(s): INR in the last 168 hours.  Urinalysis    Component Value Date/Time   COLORURINE AMBER* 10/30/2014 1639   APPEARANCEUR CLEAR 10/30/2014 1639   LABSPEC 1.021 10/30/2014 1639   PHURINE 6.5 10/30/2014 1639   GLUCOSEU NEGATIVE 10/30/2014 1639   HGBUR NEGATIVE 10/30/2014 1639   BILIRUBINUR NEGATIVE 10/30/2014 1639   KETONESUR NEGATIVE 10/30/2014 1639   PROTEINUR NEGATIVE 10/30/2014 1639   UROBILINOGEN 1.0 10/30/2014 1639   NITRITE NEGATIVE 10/30/2014 1639   LEUKOCYTESUR NEGATIVE 10/30/2014 1639      ELIGIBLE FOR AVAILABLE RESEARCH PROTOCOL: no  STUDIES: Mm Digital Diagnostic Unilat L  06/18/2015  CLINICAL DATA:  82 year old status post ultrasound-guided left breast biopsy EXAM: DIAGNOSTIC LEFT MAMMOGRAM POST ULTRASOUND BIOPSY COMPARISON:  Previous exam(s). FINDINGS: A single CC mammographic image was obtained following ultrasound guided biopsy of a subareolar left breast mass. Post biopsy image demonstrates the ribbon shaped biopsy marker in the expected location of the subareolar left breast mass. IMPRESSION: Satisfactory marker placement as above. Final Assessment: Post Procedure Mammograms for Marker Placement  Electronically Signed   By: Pamelia Hoit M.D.   On: 06/18/2015 13:10   US Breast Ltd Uni Left Inc Axilla  06/13/2015  CLINICAL DATA:  82 year old male with retracted left nipple and thickening. EXAM: 2D DIGITAL DIAGNOSTIC BILATERAL MAMMOGRAM WITH CAD AND ADJUNCT TOMO ULTRASOUND LEFT BREAST COMPARISON:  None.  ACR Breast Density Category a: The breast tissue is almost entirely fatty. FINDINGS: 2D and 3D full field views of both breasts demonstrate left nipple retraction and increased density in the retroareolar left breast. The left nipple is not fully visualized secondary to retraction. No suspicious right breast findings are identified. Mammographic images were processed with CAD. On physical exam, the left nipple is retracted and a hard retroareolar left breast masses identified. Targeted ultrasound is performed, showing a 2 x 2 x 2 cm irregular hypoechoic mass in the retroareolar left breast. No abnormal left axillary lymph nodes are identified but evaluation was technically difficult as the patient was unable to raise his arm. IMPRESSION: Highly suspicious 2 cm retroareolar left breast mass -tissue sampling is recommended. RECOMMENDATION: Ultrasound-guided left breast biopsy, which has been scheduled for 06/18/2015. I have discussed the findings with the patient. Results were also provided in writing at the conclusion of the visit. If applicable, a reminder letter will be sent to the patient regarding the next appointment. BI-RADS CATEGORY  5: Highly suggestive of malignancy. Electronically Signed   By: Margarette Canada M.D.   On: 06/13/2015 17:03   Mm Diag Breast Tomo Bilateral  06/13/2015  CLINICAL DATA:  82 year old male with retracted left nipple and thickening. EXAM: 2D DIGITAL DIAGNOSTIC BILATERAL MAMMOGRAM WITH CAD AND ADJUNCT TOMO ULTRASOUND LEFT BREAST COMPARISON:  None. ACR Breast Density Category a: The breast tissue is almost entirely fatty. FINDINGS: 2D and 3D full field views of both breasts  demonstrate left nipple retraction and increased density in the retroareolar left breast. The left nipple is not fully visualized secondary to retraction. No suspicious right breast findings are identified. Mammographic images were processed with CAD. On physical exam, the left nipple is retracted and a hard retroareolar left breast masses identified. Targeted ultrasound is performed, showing a 2 x 2 x 2 cm irregular hypoechoic mass in the retroareolar left breast. No abnormal left axillary lymph nodes are identified but evaluation was technically difficult as the patient was unable to raise his arm. IMPRESSION: Highly suspicious 2 cm retroareolar left breast mass -tissue sampling is recommended. RECOMMENDATION: Ultrasound-guided left breast biopsy, which has been scheduled for 06/18/2015. I have discussed the findings with the patient. Results were also provided in writing at the conclusion of the visit. If applicable, a reminder letter will be sent to the patient regarding the next appointment. BI-RADS CATEGORY  5: Highly suggestive of malignancy. Electronically Signed   By: Margarette Canada M.D.   On: 06/13/2015 17:03   [place] Lt Breast Bx W Loc Dev 1st Lesion Img Bx Spec US Guide  06/30/2015  ADDENDUM REPORT: 06/20/2015 11:49 ADDENDUM: Pathology revealed GRADE II INVASIVE DUCTAL CARCINOMA of the Left breast, subareolar. This was Foster to be concordant by Dr. Pamelia Hoit. Pathology results were discussed with the patient's wife by telephone. The patient is in a long term care facility and his wife has Power of Attorney. The patient's wife reported he did well after the biopsy. Post biopsy instructions and care were reviewed and questions were answered. The patient's wife was encouraged to call [facility] for any additional concerns. Pathology results were also called to Dr. Inocencio Homes and she will arrange any referrals for treatment. Pathology results reported by Terie Purser, RN on  06/20/2015. Electronically Signed   By: Pamelia Hoit M.D.   On: 06/20/2015 11:49  06/30/2015  CLINICAL DATA:  82 year old male for ultrasound-guided left breast biopsy EXAM: ULTRASOUND GUIDED LEFT BREAST  CORE NEEDLE BIOPSY COMPARISON:  Previous exam(s). FINDINGS: I met with the patient and we discussed the procedure of ultrasound-guided biopsy, including benefits and alternatives. We discussed the high likelihood of a successful procedure. We discussed the risks of the procedure, including infection, bleeding, tissue injury, clip migration, and inadequate sampling. Informed written consent was given. The usual time-out protocol was performed immediately prior to the procedure. Using sterile technique and 2% Lidocaine as local anesthetic, under direct ultrasound visualization, a 12 gauge spring-loaded device was used to perform biopsy of a subareolar left breast mass using a lateral to medial approach. At the conclusion of the procedure a ribbon shaped tissue marker clip was deployed into the biopsy cavity. Follow up 2 view mammogram was performed and dictated separately. IMPRESSION: Ultrasound guided biopsy of a subareolar left breast mass. No apparent complications. Electronically Signed: By: Pamelia Hoit M.D. On: 06/18/2015 13:09    ASSESSMENT: 82 y.o. [place] man status post left breast biopsy 06/18/2015 for a clinical T2 N0, stage IA invasive ductal carcinoma, grade 2, estrogen and progesterone receptor positive, HER-2 nonamplified, with an MIB-1 of 60%.  (1) tamoxifen started 07/09/2015  (2) consider left mastectomy  (3) genetics testing pending  PLAN: We spent the better part of today's hour-long appointment discussing the biology of breast cancer in general, and the specifics of the patient's tumor in particular. I gave that information to George Foster and his wife both.  They understand that breast cancer is rare in males and when it does occur in a man it raises the question whether he was born  with a mutated gene that might have predisposed him to breast cancer. This may be the case even if there is no breast cancer in the family. Note that his father had no sisters. Recommendation was for genetics testing and he will consider this. We left it that I will schedule him to meet with a genetics counselor when he returns to see me in 2 or 2-1/2 months.  We then discussed surgery. The patient understands that breast cancer treatment for cure does require local therapy. The 2 options are surgery and radiation. In men we generally do a mastectomy. Sometimes radiation follows and sometimes not. George Foster's initial feeling was that he did not want surgery, but I think this really needs to be discussed and he agreed to meet with a surgeon. I have already discussed this with Dr. Dalbert Batman and I will set up that referral.  We then discussed systemic therapy for breast cancer. In general, we prefer to avoid chemotherapy and in his case since we have the option of anti-estrogens that is the way we should go. In men the data shows response to tamoxifen. There is very little data using aromatase inhibitors and it is conflicting.  Accordingly we are starting tamoxifen now. We discussed the possible toxicities, side effects and complications of this agent. I gave him a written prescription that he can take to the nursing home so they can start immediately.  I'm going to see George Foster in about 2 months. I expect by then he will have had his surgery. The plan will then be to continue tamoxifen for a minimum of 5 years.  The patient has a good understanding of the overall plan. He agrees with it. He knows the goal of treatment in his case is cure. He or his wife will call with any problems that may develop before her next visit here.  Chauncey Cruel, MD   07/09/2015 5:19 PM  Medical Oncology and Hematology [facility] [address] [place], [place] [address] Tel. [phone]    Fax.  [phone]

## 2015-07-10 ENCOUNTER — Telehealth: Payer: Self-pay | Admitting: Oncology

## 2015-07-10 ENCOUNTER — Non-Acute Institutional Stay (SKILLED_NURSING_FACILITY): Payer: Medicare Other | Admitting: Internal Medicine

## 2015-07-10 DIAGNOSIS — C50822 Malignant neoplasm of overlapping sites of left male breast: Secondary | ICD-10-CM

## 2015-07-10 DIAGNOSIS — K219 Gastro-esophageal reflux disease without esophagitis: Secondary | ICD-10-CM | POA: Diagnosis not present

## 2015-07-10 NOTE — Addendum Note (Signed)
Addended by: Lujean Amel on: 07/10/2015 10:25 AM   Modules accepted: Medications

## 2015-07-10 NOTE — Telephone Encounter (Signed)
Unable to reach patient and legal guardian to inform them of upcoming appts for May and June. [facility] appt with Dr Dalbert Batman May 9 at 330 pm. Letter and calendar sent by mail

## 2015-07-19 ENCOUNTER — Encounter: Payer: Self-pay | Admitting: Internal Medicine

## 2015-07-19 NOTE — Assessment & Plan Note (Signed)
Pt was seen by Onc yesterday and started on tamoxifin and will be seeing surgery to discuss that possibilty

## 2015-07-19 NOTE — Progress Notes (Signed)
MRN: [id number] Name: George Foster  Sex: male Age: 82 y.o. DOB: [birth year]  [place] #: Karren Burly Facility/Room:217 Level Of Care: SNF Provider: Inocencio Homes D Emergency Contacts: Extended Emergency Contact Information Primary Emergency Contact: Andrews,Shirley Address: [address]          [place], [place] [address] [place] Phone: [phone] Work Phone: [phone] Relation: Legal Guardian Secondary Emergency Contact: Trinda Pascal Address: [address]          [place], [place] [address] [place] Phone: [phone] Relation: Spouse  Code Status:   Allergies: Cogentin  Chief Complaint  Patient presents with  . Acute Visit    HPI: Patient is 82 y.o. male who nursing has asked me to see. PT c/o ST and cough for SEVERAL DAYS. Denies CP or SOB. No fever. Denies allergy symptoms. Nothing makes it better or worse.Pt also c/o really bad reflux, last week, getting slt better this week. Pt also c/o rash in his GU area for a week, getting worse. He has had one like it before and nystatin makes it go away.   Past Medical History  Diagnosis Date  . Dementia   . Hypertension   . Arthritis   . Hypothyroidism   . Chronic back pain   . Anxiety disorder   . Gastroesophageal reflux disease   . Polypharmacy     History of hospitalizations for altered mental status related to medications  . Chronic constipation     with fecal impactions, recurrent  . Suicidal ideation 06/06/2011  . Traumatic subdural hematoma ([place]) 10/01/2013  . Multiple facial fractures ([place]) 10/02/2013  . Basilar skull fracture ([place]) 10/02/2013  . Acute blood loss anemia 10/02/2013  . Neuropathy ([place])   . Depression   . Mood disorder ([place])   . BPH (benign prostatic hyperplasia)   . Tremor   . Osteoarthritis     Past Surgical History  Procedure Laterality Date  . Appendectomy    . Removal of nonrestorable teeth numbers 2, 4, 6, 12, 13, 18, 29        Medication List        This list is accurate as of: 07/10/15 11:59 PM.  Always use your most recent med list.               ARIPiprazole 20 MG tablet  Commonly known as:  ABILIFY  Take 20 mg by mouth daily.     cyanocobalamin 1000 MCG tablet  Take 1,000 mcg by mouth daily.     ergocalciferol 50000 units capsule  Commonly known as:  VITAMIN D2  Take 50,000 Units by mouth once a week. Every monday     finasteride 5 MG tablet  Commonly known as:  PROSCAR  Take 5 mg by mouth daily.     gabapentin 600 MG tablet  Commonly known as:  NEURONTIN  Take 600 mg by mouth 2 (two) times daily.     levothyroxine 88 MCG tablet  Commonly known as:  SYNTHROID, LEVOTHROID  Take 88 mcg by mouth daily before breakfast.     LINZESS 290 MCG Caps capsule  Generic drug:  linaclotide  Take 290 mcg by mouth every morning.     methadone 5 MG tablet  Commonly known as:  DOLOPHINE  Take one tablet by mouth every 12 hours for pain     mirtazapine 15 MG tablet  Commonly known as:  REMERON  Take 15 mg by mouth at bedtime.  multivitamins ther. w/minerals Tabs tablet  Take 1 tablet by mouth daily.     omeprazole 40 MG capsule  Commonly known as:  PRILOSEC  Take 40 mg by mouth daily.     polyethylene glycol packet  Commonly known as:  MIRALAX / GLYCOLAX  Take 17 g by mouth daily.     Rivastigmine 13.3 MG/24HR Pt24  Place 1 patch onto the skin every morning.     senna-docusate 8.6-50 MG tablet  Commonly known as:  Senokot-S  Take 2 tablets by mouth at bedtime.     tamsulosin 0.4 MG Caps capsule  Commonly known as:  FLOMAX  Take 0.4 mg by mouth at bedtime.        No orders of the defined types were placed in this encounter.    Immunization History  Administered Date(s) Administered  . Influenza-Unspecified 10/08/2013  . PPD Test 10/07/2013  . Tdap 09/27/2013    Social History  Substance Use Topics  . Smoking status: Never Smoker   . Smokeless tobacco: Never Used  . Alcohol Use: No      Comment: Pt denies    Review of Systems  DATA OBTAINED: from patient, nurse- as per HPI GENERAL:  no fevers, fatigue, appetite changes SKIN: GU rash HEENT: No complaint RESPIRATORY: +cough, no wheezing, no SOB CARDIAC: No chest pain, palpitations, lower extremity edema  GI: No abdominal pain, No N/V/D or constipation, No heartburn, + reflux  GU: No dysuria, frequency or urgency, or incontinence  MUSCULOSKELETAL: No unrelieved bone/joint pain NEUROLOGIC: No headache, dizziness  PSYCHIATRIC: No overt anxiety or sadness  Filed Vitals:   07/19/15 1626  BP: 142/88  Pulse: 78  Temp: 98.8 F (37.1 C)  Resp: 18    Physical Exam  GENERAL APPEARANCE: Alert, conversant, No acute distress  SKIN: GU skin exam defered HEENT: Unremarkable RESPIRATORY: Breathing is even, unlabored. Lung sounds are rhonchi R post   CARDIOVASCULAR: Heart RRR no murmurs, rubs or gallops. No peripheral edema  GASTROINTESTINAL: Abdomen is soft, non-tender, not distended w/ normal bowel sounds.  GENITOURINARY: Bladder non tender, not distended  MUSCULOSKELETAL: No abnormal joints or musculature NEUROLOGIC: Cranial nerves 2-12 grossly intact. Moves all extremities PSYCHIATRIC: Mood and affect appropriate to situation, no behavioral issues  Patient Active Problem List   Diagnosis Date Noted  . Cancer of overlapping sites of left male breast ([place]) 07/09/2015  . Left breast mass 06/18/2015  . Vitamin D deficiency 04/21/2015  . Angioedema 01/09/2015  . Major depressive disorder, recurrent episode ([place]) 12/03/2014  . Essential hypertension, benign 11/19/2014  . Psychosis 11/19/2014  . Mood disorder ([place]) 10/31/2014  . Hereditary and idiopathic peripheral neuropathy 07/01/2014  . Insomnia 04/19/2014  . Fall 10/02/2013  . BPH (benign prostatic hyperplasia) 02/17/2013  . Osteoarthritis cervical spine 02/17/2013  . Chronic constipation   . Chronic back pain 06/08/2011  . Dementia with behavioral disturbance  06/06/2011  . Hypothyroidism   . Gastroesophageal reflux disease   . Anxiety disorder     CBC    Component Value Date/Time   WBC 4.7 03/25/2015   WBC 5.2 01/09/2015 1418   RBC 3.45* 01/09/2015 1418   HGB 11.4* 03/25/2015   HCT 35* 03/25/2015   PLT 149* 03/25/2015   MCV 102.3* 01/09/2015 1418   LYMPHSABS 0.8 01/09/2015 1418   MONOABS 0.3 01/09/2015 1418   EOSABS 0.2 01/09/2015 1418   BASOSABS 0.0 01/09/2015 1418    CMP     Component Value Date/Time   NA 144 03/25/2015  NA 142 01/09/2015 1418   K 4.0 03/25/2015   CL 111 01/09/2015 1418   CO2 25 01/09/2015 1418   GLUCOSE 141* 01/09/2015 1418   BUN 24* 03/25/2015   BUN 22* 01/09/2015 1418   CREATININE 1.1 03/25/2015   CREATININE 1.08 01/09/2015 1418   CALCIUM 8.4* 01/09/2015 1418   PROT 6.5 10/30/2014 1627   ALBUMIN 4.0 10/30/2014 1627   AST 11* 03/25/2015   ALT 6* 03/25/2015   ALKPHOS 68 03/25/2015   BILITOT 0.6 10/30/2014 1627   GFRNONAA 59* 01/09/2015 1418   GFRAA >60 01/09/2015 1418    Assessment and Plan  Gastroesophageal reflux disease Pt already on omeprazole 40 mg daily ;will oinc to BID for 7 days then back to daily  Cancer of overlapping sites of left male breast ([place]) Pt was seen by Onc yesterday and started on tamoxifin and will be seeing surgery to discuss that [place] - probably the viral thing going around the SNF but will order CXR  PERINREAL CANDIDIASIS - nystatin cream BID for 14 days   Time spent > 35 min;> 50% of time with patient was spent reviewing records, labs, tests and studies, counseling and developing plan of care  [name], MD

## 2015-07-19 NOTE — Assessment & Plan Note (Signed)
Pt already on omeprazole 40 mg daily ;will oinc to BID for 7 days then back to daily

## 2015-07-29 ENCOUNTER — Other Ambulatory Visit: Payer: Self-pay | Admitting: General Surgery

## 2015-07-31 ENCOUNTER — Encounter: Payer: Self-pay | Admitting: Internal Medicine

## 2015-07-31 ENCOUNTER — Non-Acute Institutional Stay (SKILLED_NURSING_FACILITY): Payer: Medicare Other | Admitting: Internal Medicine

## 2015-07-31 DIAGNOSIS — C50822 Malignant neoplasm of overlapping sites of left male breast: Secondary | ICD-10-CM | POA: Diagnosis not present

## 2015-07-31 DIAGNOSIS — M5431 Sciatica, right side: Secondary | ICD-10-CM

## 2015-07-31 NOTE — Progress Notes (Signed)
MRN: [id number] Name: George Foster  Sex: male Age: 82 y.o. DOB: Feb 21, 1925  [place] #: Karren Burly Facility/Room:217 Level Of Care: SNF Provider: Inocencio Homes D Emergency Contacts: Extended Emergency Contact Information Primary Emergency Contact: Andrews,Shirley Address: [address]          [place], [place] [address] [place] Phone: [phone] Work Phone: [phone] Relation: Legal Guardian Secondary Emergency Contact: Trinda Pascal Address: [address]          [place], [place] [address] [place] Phone: [phone] Relation: Spouse  Code Status:   Allergies: Cogentin  Chief Complaint  Patient presents with  . Acute Visit    HPI: Patient is 82 y.o. male who is being seen acutely for R sciatica pt says has been going on for several days. R buttocks pain that radiates down leg. Admits it hurts worse in the daytime (when he is sitting) and better at night. Would like something for the pain. Also discussed Onc visit pt had yesterday.  Past Medical History  Diagnosis Date  . Dementia   . Hypertension   . Arthritis   . Hypothyroidism   . Chronic back pain   . Anxiety disorder   . Gastroesophageal reflux disease   . Polypharmacy     History of hospitalizations for altered mental status related to medications  . Chronic constipation     with fecal impactions, recurrent  . Suicidal ideation 06/06/2011  . Traumatic subdural hematoma ([place]) 10/01/2013  . Multiple facial fractures ([place]) 10/02/2013  . Basilar skull fracture ([place]) 10/02/2013  . Acute blood loss anemia 10/02/2013  . Neuropathy ([place])   . Depression   . Mood disorder ([place])   . BPH (benign prostatic hyperplasia)   . Tremor   . Osteoarthritis     Past Surgical History  Procedure Laterality Date  . Appendectomy    . Removal of nonrestorable teeth numbers 2, 4, 6, 12, 13, 18, 29        Medication List       This list is accurate as of: 07/31/15  9:00 PM.  Always use  your most recent med list.               ARIPiprazole 20 MG tablet  Commonly known as:  ABILIFY  Take 20 mg by mouth daily.     cyanocobalamin 1000 MCG tablet  Take 1,000 mcg by mouth daily.     ergocalciferol 50000 units capsule  Commonly known as:  VITAMIN D2  Take 50,000 Units by mouth once a week. Every monday     finasteride 5 MG tablet  Commonly known as:  PROSCAR  Take 5 mg by mouth daily.     gabapentin 600 MG tablet  Commonly known as:  NEURONTIN  Take 600 mg by mouth 2 (two) times daily.     levothyroxine 88 MCG tablet  Commonly known as:  SYNTHROID, LEVOTHROID  Take 88 mcg by mouth daily before breakfast.     LINZESS 290 MCG Caps capsule  Generic drug:  linaclotide  Take 290 mcg by mouth every morning.     methadone 5 MG tablet  Commonly known as:  DOLOPHINE  Take one tablet by mouth every 12 hours for pain     mirtazapine 15 MG tablet  Commonly known as:  REMERON  Take 15 mg by mouth at bedtime.     multivitamins ther. w/minerals Tabs tablet  Take 1 tablet by mouth daily.  omeprazole 40 MG capsule  Commonly known as:  PRILOSEC  Take 40 mg by mouth daily.     polyethylene glycol packet  Commonly known as:  MIRALAX / GLYCOLAX  Take 17 g by mouth daily.     Rivastigmine 13.3 MG/24HR Pt24  Place 1 patch onto the skin every morning.     senna-docusate 8.6-50 MG tablet  Commonly known as:  Senokot-S  Take 2 tablets by mouth at bedtime.     tamsulosin 0.4 MG Caps capsule  Commonly known as:  FLOMAX  Take 0.4 mg by mouth at bedtime.        No orders of the defined types were placed in this encounter.    Immunization History  Administered Date(s) Administered  . Influenza-Unspecified 10/08/2013  . PPD Test 10/07/2013  . Tdap 09/27/2013    Social History  Substance Use Topics  . Smoking status: Never Smoker   . Smokeless tobacco: Never Used  . Alcohol Use: No     Comment: Pt denies    Review of Systems  DATA OBTAINED: from  patient GENERAL:  no fevers, fatigue, appetite changes SKIN: No itching, rash HEENT: No complaint RESPIRATORY: No cough, wheezing, SOB CARDIAC: No chest pain, palpitations, lower extremity edema  GI: No abdominal pain, No N/V/D or constipation, No heartburn or reflux  GU: No dysuria, frequency or urgency, or incontinence  MUSCULOSKELETAL: No unrelieved bone/joint pain NEUROLOGIC: No headache, dizziness; R buttocks pain radiating down R leg PSYCHIATRIC: No overt anxiety or sadness  Filed Vitals:   07/31/15 1622  BP: 128/74  Pulse: 69  Temp: 97.9 F (36.6 C)  Resp: 18    Physical Exam  GENERAL APPEARANCE: Alert, conversant, No acute distress  SKIN: No diaphoresis rash, or wounds HEENT: Unremarkable RESPIRATORY: Breathing is even, unlabored. Lung sounds are clear   CARDIOVASCULAR: Heart RRR no murmurs, rubs or gallops. No peripheral edema  GASTROINTESTINAL: Abdomen is soft, non-tender, not distended w/ normal bowel sounds.  GENITOURINARY: Bladder non tender, not distended  MUSCULOSKELETAL: No abnormal joints or musculature NEUROLOGIC: Cranial nerves 2-12 grossly intact. Moves all extremities PSYCHIATRIC: Mood and affect appropriate to situation, no behavioral issues  Patient Active Problem List   Diagnosis Date Noted  . Cancer of overlapping sites of left male breast ([place]) 07/09/2015  . Left breast mass 06/18/2015  . Vitamin D deficiency 04/21/2015  . Angioedema 01/09/2015  . Major depressive disorder, recurrent episode ([place]) 12/03/2014  . Essential hypertension, benign 11/19/2014  . Psychosis 11/19/2014  . Mood disorder ([place]) 10/31/2014  . Hereditary and idiopathic peripheral neuropathy 07/01/2014  . Insomnia 04/19/2014  . Fall 10/02/2013  . BPH (benign prostatic hyperplasia) 02/17/2013  . Osteoarthritis cervical spine 02/17/2013  . Chronic constipation   . Chronic back pain 06/08/2011  . Dementia with behavioral disturbance 06/06/2011  . Hypothyroidism   .  Gastroesophageal reflux disease   . Anxiety disorder     CBC    Component Value Date/Time   WBC 4.7 03/25/2015   WBC 5.2 01/09/2015 1418   RBC 3.45* 01/09/2015 1418   HGB 11.4* 03/25/2015   HCT 35* 03/25/2015   PLT 149* 03/25/2015   MCV 102.3* 01/09/2015 1418   LYMPHSABS 0.8 01/09/2015 1418   MONOABS 0.3 01/09/2015 1418   EOSABS 0.2 01/09/2015 1418   BASOSABS 0.0 01/09/2015 1418    CMP     Component Value Date/Time   NA 144 03/25/2015   NA 142 01/09/2015 1418   K 4.0 03/25/2015   CL 111 01/09/2015  1418   CO2 25 01/09/2015 1418   GLUCOSE 141* 01/09/2015 1418   BUN 24* 03/25/2015   BUN 22* 01/09/2015 1418   CREATININE 1.1 03/25/2015   CREATININE 1.08 01/09/2015 1418   CALCIUM 8.4* 01/09/2015 1418   PROT 6.5 10/30/2014 1627   ALBUMIN 4.0 10/30/2014 1627   AST 11* 03/25/2015   ALT 6* 03/25/2015   ALKPHOS 68 03/25/2015   BILITOT 0.6 10/30/2014 1627   GFRNONAA 59* 01/09/2015 1418   GFRAA >60 01/09/2015 1418    Assessment and Plan  SCIATICA - PLAN TO INCREASE NEURONTIN 600 MG bid TO TID; HAVE WRITTEN FOR PREDNISONE TAPER 60/50/40/30/20/10; pt already had pain meds  BREAST CA - l MASTECTOMY IS PLANNED FOR 08/2015.  [name], MD

## 2015-07-31 NOTE — Progress Notes (Deleted)
MRN: [id number] Name: George Foster  Sex: male Age: 82 y.o. DOB: 28-Dec-1924  [place] #: Karren Burly Facility/Room:217-A Level Of Care: SNF Provider: Inocencio Homes MD Emergency Contacts: Extended Emergency Contact Information Primary Emergency Contact: Andrews,Shirley Address: [address]          [place], [place] [address] [place] Phone: [phone] Work Phone: [phone] Relation: Legal Guardian Secondary Emergency Contact: Trinda Pascal Address: [address]          [place], [place] [address] [place] Phone: [phone] Relation: Spouse  Code Status: DNR  Allergies: Cogentin  Chief Complaint  Patient presents with  . Acute Visit    HPI: Patient is 82 y.o. male   Past Medical History  Diagnosis Date  . Dementia   . Hypertension   . Arthritis   . Hypothyroidism   . Chronic back pain   . Anxiety disorder   . Gastroesophageal reflux disease   . Polypharmacy     History of hospitalizations for altered mental status related to medications  . Chronic constipation     with fecal impactions, recurrent  . Suicidal ideation 06/06/2011  . Traumatic subdural hematoma ([place]) 10/01/2013  . Multiple facial fractures ([place]) 10/02/2013  . Basilar skull fracture ([place]) 10/02/2013  . Acute blood loss anemia 10/02/2013  . Neuropathy ([place])   . Depression   . Mood disorder ([place])   . BPH (benign prostatic hyperplasia)   . Tremor   . Osteoarthritis     Past Surgical History  Procedure Laterality Date  . Appendectomy    . Removal of nonrestorable teeth numbers 2, 4, 6, 12, 13, 18, 29        Medication List       This list is accurate as of: 07/31/15  8:46 PM.  Always use your most recent med list.               ARIPiprazole 20 MG tablet  Commonly known as:  ABILIFY  Take 20 mg by mouth daily.     cyanocobalamin 1000 MCG tablet  Take 1,000 mcg by mouth daily.     ergocalciferol 50000 units capsule  Commonly known as:  VITAMIN D2   Take 50,000 Units by mouth once a week. Every monday     finasteride 5 MG tablet  Commonly known as:  PROSCAR  Take 5 mg by mouth daily.     gabapentin 600 MG tablet  Commonly known as:  NEURONTIN  Take 600 mg by mouth 2 (two) times daily.     levothyroxine 88 MCG tablet  Commonly known as:  SYNTHROID, LEVOTHROID  Take 88 mcg by mouth daily before breakfast.     LINZESS 290 MCG Caps capsule  Generic drug:  linaclotide  Take 290 mcg by mouth every morning.     methadone 5 MG tablet  Commonly known as:  DOLOPHINE  Take one tablet by mouth every 12 hours for pain     mirtazapine 15 MG tablet  Commonly known as:  REMERON  Take 15 mg by mouth at bedtime.     multivitamins ther. w/minerals Tabs tablet  Take 1 tablet by mouth daily.     omeprazole 40 MG capsule  Commonly known as:  PRILOSEC  Take 40 mg by mouth daily.     polyethylene glycol packet  Commonly known as:  MIRALAX / GLYCOLAX  Take 17 g by mouth daily.     Rivastigmine 13.3 MG/24HR Pt24  Place 1 patch  onto the skin every morning.     senna-docusate 8.6-50 MG tablet  Commonly known as:  Senokot-S  Take 2 tablets by mouth at bedtime.     tamsulosin 0.4 MG Caps capsule  Commonly known as:  FLOMAX  Take 0.4 mg by mouth at bedtime.        No orders of the defined types were placed in this encounter.    Immunization History  Administered Date(s) Administered  . Influenza-Unspecified 10/08/2013  . PPD Test 10/07/2013  . Tdap 09/27/2013    Social History  Substance Use Topics  . Smoking status: Never Smoker   . Smokeless tobacco: Never Used  . Alcohol Use: No     Comment: Pt denies    Filed Vitals:   07/31/15 1622  BP: 128/74  Pulse: 69  Temp: 97.9 F (36.6 C)  Resp: 18    Physical Exam  GENERAL APPEARANCE: Alert, conversant. No acute distress.  HEENT: Unremarkable. RESPIRATORY: Breathing is even, unlabored. Lung sounds are clear   CARDIOVASCULAR: Heart RRR no murmurs, rubs or gallops.  No peripheral edema.  GASTROINTESTINAL: Abdomen is soft, non-tender, not distended w/ normal bowel sounds.  NEUROLOGIC: Cranial nerves 2-12 grossly intact. Moves all extremities  Patient Active Problem List   Diagnosis Date Noted  . Cancer of overlapping sites of left male breast ([place]) 07/09/2015  . Left breast mass 06/18/2015  . Vitamin D deficiency 04/21/2015  . Angioedema 01/09/2015  . Major depressive disorder, recurrent episode ([place]) 12/03/2014  . Essential hypertension, benign 11/19/2014  . Psychosis 11/19/2014  . Mood disorder ([place]) 10/31/2014  . Hereditary and idiopathic peripheral neuropathy 07/01/2014  . Insomnia 04/19/2014  . Fall 10/02/2013  . BPH (benign prostatic hyperplasia) 02/17/2013  . Osteoarthritis cervical spine 02/17/2013  . Chronic constipation   . Chronic back pain 06/08/2011  . Dementia with behavioral disturbance 06/06/2011  . Hypothyroidism   . Gastroesophageal reflux disease   . Anxiety disorder     CBC    Component Value Date/Time   WBC 4.7 03/25/2015   WBC 5.2 01/09/2015 1418   RBC 3.45* 01/09/2015 1418   HGB 11.4* 03/25/2015   HCT 35* 03/25/2015   PLT 149* 03/25/2015   MCV 102.3* 01/09/2015 1418   LYMPHSABS 0.8 01/09/2015 1418   MONOABS 0.3 01/09/2015 1418   EOSABS 0.2 01/09/2015 1418   BASOSABS 0.0 01/09/2015 1418    CMP     Component Value Date/Time   NA 144 03/25/2015   NA 142 01/09/2015 1418   K 4.0 03/25/2015   CL 111 01/09/2015 1418   CO2 25 01/09/2015 1418   GLUCOSE 141* 01/09/2015 1418   BUN 24* 03/25/2015   BUN 22* 01/09/2015 1418   CREATININE 1.1 03/25/2015   CREATININE 1.08 01/09/2015 1418   CALCIUM 8.4* 01/09/2015 1418   PROT 6.5 10/30/2014 1627   ALBUMIN 4.0 10/30/2014 1627   AST 11* 03/25/2015   ALT 6* 03/25/2015   ALKPHOS 68 03/25/2015   BILITOT 0.6 10/30/2014 1627   GFRNONAA 59* 01/09/2015 1418   GFRAA >60 01/09/2015 1418    Assessment and Plan  No problem-specific assessment & plan notes found for  this encounter.   Inocencio Homes  MD

## 2015-08-15 NOTE — H&P (Signed)
[facility]. [facility]  Location: [facility] Patient #: [id number] DOB: 03/03/1925 Married / Language: English / Race: White Male       History of Present Illness  The patient is a 82 year old male who presents with breast cancer. This is a 82 year old gentleman, referred by Dr. Tressa Busman for surgical opinion regarding management of cancer of his left breast. Dr. Serita Sheller is his primary care physician. [facility] 4 years. His wife is with him today and she is independent and lives at home.  The wife states that she noticed a painless ulcerated lump in his left breast recently and sent him for evaluation. No drainage or odor. Mammograms and ultrasound show a highly suspicious 2 cm retroareolar left breast mass. The left axilla normal by ultrasound. 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  Dr. Jana Hakim has seen him on July 09, 2015, and discussed the increased incidence of genetic mutation with the patient and his wife and plans genetic counseling after he has surgery. He has already been started on tamoxifen. He is not a candidate for cytotoxic chemotherapy. Decisions about radiation therapy will come later, but he probably would not tolerate that either.  He is alert and conversant and pleasant. He has had traumatic subdural hematoma and maybe some TIAs. He does have a diagnosis of dementia and takes Abilify and rivastigmine patch. He has BPH and takes tamsulosin capsules and finasteride tablets. He has chronic constipation and has had a fecal impaction in the past and takes senna tablets daily. He has GERD and takes Prilosec. He also takes MiraLAX once daily to prevent constipation. He takes methadone. He is pretty much dependent on a wheelchair due to arthritis. He denies chest pain, dizziness,  or shortness of breath.  I talked to the patient and his wife for a long time. They want to go ahead with the simple mastectomy. I discussed the indications details techniques and numerous risk of the surgery with them. They're aware of the risk of bleeding, infection, nerve damage with chronic pain or numbness, shoulder disability. Remote chance of arm swelling. Cardiac, thromboembolic, or pulmonary complications. The understand all of these issues and all of their questions are answered. They agree with this plan.  I called and spoke to Dr. Jillyn Hidden of the anesthesia Department. He told me it would be okay to leave the patient on his rivastigmine patch and he will recheck further into this. We will plan LMA anesthesia without any muscle paralysis.     Other Problems  Anxiety Disorder Back Pain Bladder Problems Breast Cancer Enlarged Prostate Gastroesophageal Reflux Disease Kidney Stone  Past Surgical History Breast Biopsy Left. Cataract Surgery Bilateral. Oral Surgery  Allergies  Benztropine Mesylate *ANTIPARKINSON AGENTS*  Medication History  Abilify (20MG Tablet, Oral) Active. Cyanocobalamin (1000MCG Tablet, Oral) Active. Finasteride (5MG Tablet, Oral) Active. Gabapentin (600MG Tablet, Oral) Active. Levothyroxine Sodium (88MCG Tablet, Oral) Active. Linzess (145MCG Capsule, Oral) Active. Remeron (15MG Tablet, Oral) Active. Multiple Vitamin (Oral) Active. Omeprazole (10MG Capsule DR, Oral) Active. Rivastigmine (13.3MG/24HR Patch 24HR, Transdermal) Active. Tamsulosin HCl (0.4MG Capsule, Oral) Active. Senna (217MG Tablet, Oral) Active. Medications Reconciled  Social History No alcohol use No drug use Tobacco use Never smoker.  Family History First Degree Relatives No pertinent family history    Review of Systems General Present- Fatigue and Weight Gain. Not Present- Appetite  Loss, Chills, Fever, Night Sweats and Weight  Loss. HEENT Present- Hearing Loss, Visual Disturbances and Wears glasses/contact lenses. Not Present- Earache, Hoarseness, Nose Bleed, Oral Ulcers, Ringing in the Ears, Seasonal Allergies, Sinus Pain, Sore Throat and Yellow Eyes. Breast Present- Breast Pain, Nipple Discharge and Skin Changes. Not Present- Breast Mass. Cardiovascular Not Present- Chest Pain, Difficulty Breathing Lying Down, Leg Cramps, Palpitations, Rapid Heart Rate, Shortness of Breath and Swelling of Extremities. Gastrointestinal Present- Chronic diarrhea and Constipation. Not Present- Abdominal Pain, Bloating, Bloody Stool, Change in Bowel Habits, Difficulty Swallowing, Excessive gas, Gets full quickly at meals, Hemorrhoids, Indigestion, Nausea, Rectal Pain and Vomiting. Male Genitourinary Present- Nocturia, Urgency and Urine Leakage. Not Present- Blood in Urine, Change in Urinary Stream, Frequency, Impotence and Painful Urination. Musculoskeletal Present- Back Pain, Joint Pain, Muscle Pain and Muscle Weakness. Not Present- Joint Stiffness and Swelling of Extremities. Neurological Present- Decreased Memory, Headaches, Tingling and Weakness. Not Present- Fainting, Numbness, Seizures, Tremor and Trouble walking. Psychiatric Present- Depression and Frequent crying. Not Present- Anxiety, Bipolar, Change in Sleep Pattern and Fearful. Endocrine Not Present- Cold Intolerance, Excessive Hunger, Hair Changes, Heat Intolerance, Hot flashes and New Diabetes. Hematology Not Present- Easy Bruising, Excessive bleeding, Gland problems, HIV and Persistent Infections.  Vitals  Weight: 187 lb Height: 67in Body Surface Area: 1.97 m Body Mass Index: 29.29 kg/m  Temp.: 33F(Temporal)  Pulse: 81 (Regular)  BP: 130/76 (Sitting, Left Arm, Standard)    Physical Exam General Note: Elderly gentleman in a wheelchair. Alert. Friendly. Reasonable sense of humor. Follows the conversation well. Has good but not perfect insight into his  medical decision making. In no distress.   Head and Neck Head-normocephalic, atraumatic with no lesions or palpable masses. Trachea-midline. Thyroid Gland Characteristics - normal size and consistency and no palpable nodules. Note: Edentulous   Chest and Lung Exam Chest and lung exam reveals -on auscultation, normal breath sounds, no adventitious sounds and normal vocal resonance.  Breast Note: Left breast reveals a 3 cm mobile palpable mass. There is a dry crusty deposit on the nipple and the nipple is essentially replaced by this. The areola has some changes as well and there is nipple retraction. The rest of the breast and chest wall skin looks good. There is no axillary mass. He can abduct his left shoulder at least 90 although it is a little bit stiff. The right breast reveals no skin changes no mass and no axillary adenopathy.   Cardiovascular Cardiovascular examination reveals -normal heart sounds, regular rate and rhythm with no murmurs and femoral artery auscultation bilaterally reveals normal pulses, no bruits, no thrills.  Abdomen Inspection Inspection of the abdomen reveals - No Hernias. Palpation/Percussion Palpation and Percussion of the abdomen reveal - Soft, Non Tender, No Rigidity (guarding), No hepatosplenomegaly and No Palpable abdominal masses.  Neurologic Note: Alert. Answers questions appropriately but not with any great detail. In a wheelchair. Diminished strength all 4 extremities   Musculoskeletal Normal Exam - Bilateral-Upper Extremity Strength Normal and Lower Extremity Strength Normal.    Assessment & Plan CANCER OF CENTRAL PORTION OF LEFT MALE BREAST (C50.122) .  Your recent imaging studies and biopsy showed that you have an invasive cancer of the left breast The lymph nodes under your left arm looks normal on ultrasound exam This tumor is sensitive to estrogen, and so Dr. Jana Hakim has already started you on estrogen blocking drug  called tamoxifen. Continue to take that. We have advised that you have an operation called a left total mastectomy. I do  not think we need to do any lymph node biopsies. Hopefully the surgery and the tamoxifen medicine is all that you will need. We have discussed the indications, techniques, and numerous risk of this surgery Please read the printed information that we have given you  We will coordinate this surgery with your family.  DEMENTIA IN ALZHEIMER'S DISEASE (G30.9) CHRONIC GERD (K21.9) HYPERTENSION, BENIGN (I10) H/O TRAUMATIC SUBDURAL HEMATOMA (K47.533) BPH WITHOUT URINARY OBSTRUCTION (N40.0) CONSTIPATION, CHRONIC (K59.09) Impression: History of fecal impaction    Edsel Petrin. Dalbert Batman, M.D., [facility] General and Minimally invasive Surgery Breast and Colorectal Surgery Office:   [phone] Pager:   [phone]

## 2015-08-19 ENCOUNTER — Other Ambulatory Visit (HOSPITAL_COMMUNITY): Payer: Self-pay | Admitting: *Deleted

## 2015-08-19 ENCOUNTER — Encounter (HOSPITAL_COMMUNITY): Payer: Self-pay | Admitting: *Deleted

## 2015-08-19 ENCOUNTER — Other Ambulatory Visit: Payer: Self-pay | Admitting: *Deleted

## 2015-08-19 DIAGNOSIS — M549 Dorsalgia, unspecified: Principal | ICD-10-CM

## 2015-08-19 DIAGNOSIS — G8929 Other chronic pain: Secondary | ICD-10-CM

## 2015-08-19 MED ORDER — METHADONE HCL 5 MG PO TABS: ORAL_TABLET | ORAL | Status: DC

## 2015-08-19 MED ORDER — CEFAZOLIN SODIUM-DEXTROSE 2-4 GM/100ML-% IV SOLN
2.0000 g | INTRAVENOUS | Status: AC
  Administered 2015-08-20: 2 g via INTRAVENOUS
  Filled 2015-08-19: qty 100

## 2015-08-19 NOTE — Telephone Encounter (Signed)
[facility] 

## 2015-08-20 ENCOUNTER — Ambulatory Visit (HOSPITAL_COMMUNITY): Payer: Medicare Other | Admitting: Certified Registered Nurse Anesthetist

## 2015-08-20 ENCOUNTER — Ambulatory Visit (HOSPITAL_COMMUNITY)
Admission: RE | Admit: 2015-08-20 | Discharge: 2015-08-21 | Disposition: A | Discharge status: Skilled Nursing Facility | Payer: Medicare Other | Source: Ambulatory Visit | Attending: General Surgery | Admitting: General Surgery

## 2015-08-20 ENCOUNTER — Ambulatory Visit (HOSPITAL_COMMUNITY): Payer: Medicare Other

## 2015-08-20 ENCOUNTER — Encounter (HOSPITAL_COMMUNITY): Payer: Self-pay | Admitting: *Deleted

## 2015-08-20 ENCOUNTER — Encounter (HOSPITAL_COMMUNITY)
Admission: RE | Disposition: A | Discharge status: Skilled Nursing Facility | Payer: Self-pay | Source: Ambulatory Visit | Attending: General Surgery

## 2015-08-20 DIAGNOSIS — N400 Enlarged prostate without lower urinary tract symptoms: Secondary | ICD-10-CM | POA: Insufficient documentation

## 2015-08-20 DIAGNOSIS — Z87820 Personal history of traumatic brain injury: Secondary | ICD-10-CM | POA: Diagnosis not present

## 2015-08-20 DIAGNOSIS — K219 Gastro-esophageal reflux disease without esophagitis: Secondary | ICD-10-CM | POA: Diagnosis not present

## 2015-08-20 DIAGNOSIS — Z01810 Encounter for preprocedural cardiovascular examination: Secondary | ICD-10-CM

## 2015-08-20 DIAGNOSIS — G309 Alzheimer's disease, unspecified: Secondary | ICD-10-CM | POA: Insufficient documentation

## 2015-08-20 DIAGNOSIS — Z6829 Body mass index (BMI) 29.0-29.9, adult: Secondary | ICD-10-CM | POA: Diagnosis not present

## 2015-08-20 DIAGNOSIS — R4181 Age-related cognitive decline: Secondary | ICD-10-CM | POA: Diagnosis not present

## 2015-08-20 DIAGNOSIS — F0280 Dementia in other diseases classified elsewhere without behavioral disturbance: Secondary | ICD-10-CM | POA: Insufficient documentation

## 2015-08-20 DIAGNOSIS — Z170 Estrogen receptor positive status [ER+]: Secondary | ICD-10-CM | POA: Diagnosis not present

## 2015-08-20 DIAGNOSIS — M542 Cervicalgia: Secondary | ICD-10-CM | POA: Diagnosis not present

## 2015-08-20 DIAGNOSIS — Z79810 Long term (current) use of selective estrogen receptor modulators (SERMs): Secondary | ICD-10-CM | POA: Diagnosis not present

## 2015-08-20 DIAGNOSIS — Z79899 Other long term (current) drug therapy: Secondary | ICD-10-CM | POA: Insufficient documentation

## 2015-08-20 DIAGNOSIS — I10 Essential (primary) hypertension: Secondary | ICD-10-CM | POA: Diagnosis not present

## 2015-08-20 DIAGNOSIS — C50922 Malignant neoplasm of unspecified site of left male breast: Secondary | ICD-10-CM | POA: Diagnosis present

## 2015-08-20 DIAGNOSIS — G8929 Other chronic pain: Secondary | ICD-10-CM | POA: Insufficient documentation

## 2015-08-20 DIAGNOSIS — E46 Unspecified protein-calorie malnutrition: Secondary | ICD-10-CM | POA: Diagnosis not present

## 2015-08-20 DIAGNOSIS — K5909 Other constipation: Secondary | ICD-10-CM | POA: Diagnosis not present

## 2015-08-20 DIAGNOSIS — C50122 Malignant neoplasm of central portion of left male breast: Secondary | ICD-10-CM | POA: Insufficient documentation

## 2015-08-20 DIAGNOSIS — M549 Dorsalgia, unspecified: Secondary | ICD-10-CM | POA: Insufficient documentation

## 2015-08-20 DIAGNOSIS — E039 Hypothyroidism, unspecified: Secondary | ICD-10-CM | POA: Diagnosis not present

## 2015-08-20 DIAGNOSIS — C50822 Malignant neoplasm of overlapping sites of left male breast: Secondary | ICD-10-CM | POA: Diagnosis present

## 2015-08-20 HISTORY — DX: Malignant neoplasm of unspecified site of left female breast: C50.912

## 2015-08-20 HISTORY — DX: Anxiety disorder, unspecified: F41.9

## 2015-08-20 HISTORY — DX: Urinary tract infection, site not specified: N39.0

## 2015-08-20 HISTORY — PX: MASTECTOMY: SHX3

## 2015-08-20 HISTORY — PX: TOTAL MASTECTOMY: SHX6129

## 2015-08-20 LAB — COMPREHENSIVE METABOLIC PANEL
ALBUMIN: 4 g/dL (ref 3.5–5.0)
ALT: 15 U/L — ABNORMAL LOW (ref 17–63)
ANION GAP: 7 (ref 5–15)
AST: 24 U/L (ref 15–41)
Alkaline Phosphatase: 49 U/L (ref 38–126)
BILIRUBIN TOTAL: 0.9 mg/dL (ref 0.3–1.2)
BUN: 25 mg/dL — ABNORMAL HIGH (ref 6–20)
CHLORIDE: 109 mmol/L (ref 101–111)
CO2: 25 mmol/L (ref 22–32)
Calcium: 9.1 mg/dL (ref 8.9–10.3)
Creatinine, Ser: 1.24 mg/dL (ref 0.61–1.24)
GFR calc Af Amer: 58 mL/min — ABNORMAL LOW (ref >60)
GFR, EST NON AFRICAN AMERICAN: 50 mL/min — AB (ref >60)
Glucose, Bld: 100 mg/dL — ABNORMAL HIGH (ref 65–99)
POTASSIUM: 4.2 mmol/L (ref 3.5–5.1)
Sodium: 141 mmol/L (ref 135–145)
TOTAL PROTEIN: 6.8 g/dL (ref 6.5–8.1)

## 2015-08-20 LAB — CBC WITH DIFFERENTIAL/PLATELET
BASOS PCT: 0 %
Basophils Absolute: 0 10*3/uL (ref 0.0–0.1)
EOS PCT: 5 %
Eosinophils Absolute: 0.3 10*3/uL (ref 0.0–0.7)
HEMATOCRIT: 38.4 % — AB (ref 39.0–52.0)
Hemoglobin: 12.5 g/dL — ABNORMAL LOW (ref 13.0–17.0)
Lymphocytes Relative: 26 %
Lymphs Abs: 1.5 10*3/uL (ref 0.7–4.0)
MCH: 33.4 pg (ref 26.0–34.0)
MCHC: 32.6 g/dL (ref 30.0–36.0)
MCV: 102.7 fL — AB (ref 78.0–100.0)
MONO ABS: 0.5 10*3/uL (ref 0.1–1.0)
MONOS PCT: 8 %
NEUTROS ABS: 3.6 10*3/uL (ref 1.7–7.7)
Neutrophils Relative %: 61 %
PLATELETS: 133 10*3/uL — AB (ref 150–400)
RBC: 3.74 MIL/uL — ABNORMAL LOW (ref 4.22–5.81)
RDW: 13.3 % (ref 11.5–15.5)
WBC: 5.8 10*3/uL (ref 4.0–10.5)

## 2015-08-20 LAB — URINALYSIS, ROUTINE W REFLEX MICROSCOPIC
BILIRUBIN URINE: NEGATIVE
Glucose, UA: NEGATIVE mg/dL
Hgb urine dipstick: NEGATIVE
KETONES UR: NEGATIVE mg/dL
LEUKOCYTES UA: NEGATIVE
NITRITE: NEGATIVE
PROTEIN: NEGATIVE mg/dL
Specific Gravity, Urine: 1.024 (ref 1.005–1.030)
pH: 5.5 (ref 5.0–8.0)

## 2015-08-20 LAB — PROTIME-INR
INR: 1.18 (ref 0.00–1.49)
PROTHROMBIN TIME: 15.2 s (ref 11.6–15.2)

## 2015-08-20 LAB — APTT: APTT: 34 s (ref 24–37)

## 2015-08-20 SURGERY — MASTECTOMY, SIMPLE
Anesthesia: General | Site: Breast | Laterality: Left

## 2015-08-20 MED ORDER — BUPIVACAINE-EPINEPHRINE (PF) 0.5% -1:200000 IJ SOLN
INTRAMUSCULAR | Status: DC | PRN
  Administered 2015-08-20: 30 mL

## 2015-08-20 MED ORDER — ONDANSETRON HCL 4 MG/2ML IJ SOLN
INTRAMUSCULAR | Status: AC
  Filled 2015-08-20: qty 2

## 2015-08-20 MED ORDER — PHENYLEPHRINE HCL 10 MG/ML IJ SOLN
INTRAMUSCULAR | Status: DC | PRN
  Administered 2015-08-20: 120 ug via INTRAVENOUS

## 2015-08-20 MED ORDER — LACTATED RINGERS IV SOLN
INTRAVENOUS | Status: DC
  Administered 2015-08-20: 10:00:00 via INTRAVENOUS

## 2015-08-20 MED ORDER — ACETAMINOPHEN 325 MG PO TABS
650.0000 mg | ORAL_TABLET | Freq: Four times a day (QID) | ORAL | Status: DC | PRN
Start: 2015-08-20 — End: 2015-08-21
  Administered 2015-08-21: 650 mg via ORAL
  Filled 2015-08-20: qty 2

## 2015-08-20 MED ORDER — LEVOTHYROXINE SODIUM 88 MCG PO TABS
88.0000 ug | ORAL_TABLET | Freq: Every day | ORAL | Status: DC
  Administered 2015-08-21: 88 ug via ORAL
  Filled 2015-08-20: qty 1

## 2015-08-20 MED ORDER — MIDAZOLAM HCL 2 MG/2ML IJ SOLN
INTRAMUSCULAR | Status: AC
  Filled 2015-08-20: qty 2

## 2015-08-20 MED ORDER — GLYCOPYRROLATE 0.2 MG/ML IV SOSY
PREFILLED_SYRINGE | INTRAVENOUS | Status: AC
  Filled 2015-08-20: qty 3

## 2015-08-20 MED ORDER — CHLORHEXIDINE GLUCONATE 4 % EX LIQD
1.0000 | Freq: Once | CUTANEOUS | Status: DC
Start: 2015-08-20 — End: 2015-08-20

## 2015-08-20 MED ORDER — HEPARIN SODIUM (PORCINE) 5000 UNIT/ML IJ SOLN
5000.0000 [IU] | Freq: Three times a day (TID) | INTRAMUSCULAR | Status: DC
  Administered 2015-08-21: 5000 [IU] via SUBCUTANEOUS
  Filled 2015-08-20: qty 1

## 2015-08-20 MED ORDER — LACTATED RINGERS IV SOLN
INTRAVENOUS | Status: DC
  Administered 2015-08-20: 12:00:00 via INTRAVENOUS

## 2015-08-20 MED ORDER — METHADONE HCL 10 MG PO TABS
5.0000 mg | ORAL_TABLET | Freq: Two times a day (BID) | ORAL | Status: DC
  Administered 2015-08-20 – 2015-08-21 (×2): 5 mg via ORAL
  Filled 2015-08-20 (×2): qty 1

## 2015-08-20 MED ORDER — ACETAMINOPHEN 10 MG/ML IV SOLN
1000.0000 mg | INTRAVENOUS | Status: AC
  Administered 2015-08-20: 1000 mg via INTRAVENOUS
  Filled 2015-08-20: qty 100

## 2015-08-20 MED ORDER — PANTOPRAZOLE SODIUM 40 MG PO TBEC
40.0000 mg | DELAYED_RELEASE_TABLET | Freq: Every day | ORAL | Status: DC
  Administered 2015-08-21: 40 mg via ORAL
  Filled 2015-08-20: qty 1

## 2015-08-20 MED ORDER — MIRTAZAPINE 15 MG PO TABS
15.0000 mg | ORAL_TABLET | Freq: Every day | ORAL | Status: DC
  Administered 2015-08-20: 15 mg via ORAL
  Filled 2015-08-20: qty 1

## 2015-08-20 MED ORDER — MORPHINE SULFATE (PF) 2 MG/ML IV SOLN
0.5000 mg | INTRAVENOUS | Status: DC | PRN
  Administered 2015-08-20 – 2015-08-21 (×2): 0.5 mg via INTRAVENOUS
  Filled 2015-08-20 (×2): qty 1

## 2015-08-20 MED ORDER — PROPOFOL 10 MG/ML IV BOLUS
INTRAVENOUS | Status: DC | PRN
  Administered 2015-08-20: 100 mg via INTRAVENOUS

## 2015-08-20 MED ORDER — ACETAMINOPHEN 650 MG RE SUPP: 650.0000 mg | Freq: Four times a day (QID) | RECTAL | Status: DC | PRN

## 2015-08-20 MED ORDER — FINASTERIDE 5 MG PO TABS
5.0000 mg | ORAL_TABLET | Freq: Every day | ORAL | Status: DC
  Administered 2015-08-21: 5 mg via ORAL
  Filled 2015-08-20: qty 1

## 2015-08-20 MED ORDER — ONDANSETRON HCL 4 MG/2ML IJ SOLN
INTRAMUSCULAR | Status: DC | PRN
  Administered 2015-08-20: 4 mg via INTRAVENOUS

## 2015-08-20 MED ORDER — FENTANYL CITRATE (PF) 100 MCG/2ML IJ SOLN
100.0000 ug | Freq: Once | INTRAMUSCULAR | Status: DC
  Filled 2015-08-20: qty 2

## 2015-08-20 MED ORDER — CHLORHEXIDINE GLUCONATE 4 % EX LIQD: 1.0000 "application " | Freq: Once | CUTANEOUS | Status: DC

## 2015-08-20 MED ORDER — TAMSULOSIN HCL 0.4 MG PO CAPS
0.4000 mg | ORAL_CAPSULE | Freq: Every day | ORAL | Status: DC
  Administered 2015-08-20: 0.4 mg via ORAL
  Filled 2015-08-20: qty 1

## 2015-08-20 MED ORDER — SENNOSIDES-DOCUSATE SODIUM 8.6-50 MG PO TABS
2.0000 | ORAL_TABLET | Freq: Every day | ORAL | Status: DC
  Administered 2015-08-20: 2 via ORAL
  Filled 2015-08-20: qty 2

## 2015-08-20 MED ORDER — RIVASTIGMINE 13.3 MG/24HR TD PT24: 1.0000 | MEDICATED_PATCH | Freq: Every morning | TRANSDERMAL | Status: DC

## 2015-08-20 MED ORDER — GABAPENTIN 600 MG PO TABS
600.0000 mg | ORAL_TABLET | Freq: Three times a day (TID) | ORAL | Status: DC
  Administered 2015-08-20 – 2015-08-21 (×2): 600 mg via ORAL
  Filled 2015-08-20 (×2): qty 1

## 2015-08-20 MED ORDER — FENTANYL CITRATE (PF) 100 MCG/2ML IJ SOLN: 25.0000 ug | INTRAMUSCULAR | Status: DC | PRN

## 2015-08-20 MED ORDER — FENTANYL CITRATE (PF) 100 MCG/2ML IJ SOLN
INTRAMUSCULAR | Status: AC
  Administered 2015-08-20: 50 ug
  Filled 2015-08-20: qty 2

## 2015-08-20 MED ORDER — ADULT MULTIVITAMIN W/MINERALS CH
1.0000 | ORAL_TABLET | Freq: Every day | ORAL | Status: DC
  Administered 2015-08-21: 1 via ORAL
  Filled 2015-08-20 (×2): qty 1

## 2015-08-20 MED ORDER — ONDANSETRON 4 MG PO TBDP: 4.0000 mg | ORAL_TABLET | Freq: Four times a day (QID) | ORAL | Status: DC | PRN

## 2015-08-20 MED ORDER — TAMOXIFEN CITRATE 10 MG PO TABS
20.0000 mg | ORAL_TABLET | Freq: Every day | ORAL | Status: DC
  Administered 2015-08-21: 20 mg via ORAL
  Filled 2015-08-20: qty 2

## 2015-08-20 MED ORDER — PHENYLEPHRINE 40 MCG/ML (10ML) SYRINGE FOR IV PUSH (FOR BLOOD PRESSURE SUPPORT)
PREFILLED_SYRINGE | INTRAVENOUS | Status: AC
  Filled 2015-08-20: qty 20

## 2015-08-20 MED ORDER — FENTANYL CITRATE (PF) 250 MCG/5ML IJ SOLN
INTRAMUSCULAR | Status: AC
  Filled 2015-08-20: qty 5

## 2015-08-20 MED ORDER — EPHEDRINE SULFATE 50 MG/ML IJ SOLN
INTRAMUSCULAR | Status: DC | PRN
  Administered 2015-08-20 (×2): 10 mg via INTRAVENOUS

## 2015-08-20 MED ORDER — VITAMIN B-12 1000 MCG PO TABS
1000.0000 ug | ORAL_TABLET | Freq: Every day | ORAL | Status: DC
  Administered 2015-08-21: 1000 ug via ORAL
  Filled 2015-08-20: qty 1

## 2015-08-20 MED ORDER — EPHEDRINE 5 MG/ML INJ
INTRAVENOUS | Status: AC
  Filled 2015-08-20: qty 10

## 2015-08-20 MED ORDER — POLYETHYLENE GLYCOL 3350 17 G PO PACK
17.0000 g | PACK | Freq: Every day | ORAL | Status: DC
  Administered 2015-08-21: 17 g via ORAL
  Filled 2015-08-20: qty 1

## 2015-08-20 MED ORDER — ARIPIPRAZOLE 10 MG PO TABS
15.0000 mg | ORAL_TABLET | Freq: Every day | ORAL | Status: DC
  Administered 2015-08-21: 15 mg via ORAL
  Filled 2015-08-20: qty 2

## 2015-08-20 MED ORDER — GLYCOPYRROLATE 0.2 MG/ML IJ SOLN
INTRAMUSCULAR | Status: DC | PRN
  Administered 2015-08-20: .2 mg via INTRAVENOUS

## 2015-08-20 MED ORDER — LINACLOTIDE 290 MCG PO CAPS
290.0000 ug | ORAL_CAPSULE | ORAL | Status: DC
  Administered 2015-08-21: 290 ug via ORAL
  Filled 2015-08-20: qty 1

## 2015-08-20 MED ORDER — FENTANYL CITRATE (PF) 100 MCG/2ML IJ SOLN
INTRAMUSCULAR | Status: DC | PRN
  Administered 2015-08-20: 25 ug via INTRAVENOUS
  Administered 2015-08-20: 100 ug via INTRAVENOUS
  Administered 2015-08-20: 25 ug via INTRAVENOUS

## 2015-08-20 MED ORDER — ONDANSETRON HCL 4 MG/2ML IJ SOLN: 4.0000 mg | Freq: Four times a day (QID) | INTRAMUSCULAR | Status: DC | PRN

## 2015-08-20 MED ORDER — 0.9 % SODIUM CHLORIDE (POUR BTL) OPTIME
TOPICAL | Status: DC | PRN
  Administered 2015-08-20 (×2): 1000 mL

## 2015-08-20 SURGICAL SUPPLY — 53 items
APPLIER CLIP 9.375 MED OPEN (MISCELLANEOUS) ×3
BINDER BREAST LRG (GAUZE/BANDAGES/DRESSINGS) IMPLANT
BINDER BREAST XLRG (GAUZE/BANDAGES/DRESSINGS) ×3 IMPLANT
BIOPATCH RED 1 DISK 7.0 (GAUZE/BANDAGES/DRESSINGS) ×2 IMPLANT
BIOPATCH RED 1IN DISK 7.0MM (GAUZE/BANDAGES/DRESSINGS) ×1
CANISTER SUCTION 2500CC (MISCELLANEOUS) ×3 IMPLANT
CHLORAPREP W/TINT 26ML (MISCELLANEOUS) ×3 IMPLANT
CLIP APPLIE 9.375 MED OPEN (MISCELLANEOUS) ×1 IMPLANT
COVER SURGICAL LIGHT HANDLE (MISCELLANEOUS) ×3 IMPLANT
DERMABOND ADVANCED (GAUZE/BANDAGES/DRESSINGS) ×2
DERMABOND ADVANCED .7 DNX12 (GAUZE/BANDAGES/DRESSINGS) ×1 IMPLANT
DEVICE DISSECT PLASMABLAD 3.0S (MISCELLANEOUS) IMPLANT
DRAIN CHANNEL 19F RND (DRAIN) ×3 IMPLANT
DRAPE CHEST BREAST 15X10 FENES (DRAPES) ×3 IMPLANT
DRAPE PROXIMA HALF (DRAPES) ×3 IMPLANT
DRSG PAD ABDOMINAL 8X10 ST (GAUZE/BANDAGES/DRESSINGS) ×3 IMPLANT
DRSG TEGADERM 2-3/8X2-3/4 SM (GAUZE/BANDAGES/DRESSINGS) ×3 IMPLANT
DRSG TEGADERM 4X4.75 (GAUZE/BANDAGES/DRESSINGS) ×3 IMPLANT
ELECT BLADE 4.0 EZ CLEAN MEGAD (MISCELLANEOUS) ×3
ELECT CAUTERY BLADE 6.4 (BLADE) ×3 IMPLANT
ELECT REM PT RETURN 9FT ADLT (ELECTROSURGICAL) ×3
ELECTRODE BLDE 4.0 EZ CLN MEGD (MISCELLANEOUS) ×1 IMPLANT
ELECTRODE REM PT RTRN 9FT ADLT (ELECTROSURGICAL) ×1 IMPLANT
EVACUATOR SILICONE 100CC (DRAIN) ×3 IMPLANT
GAUZE SPONGE 4X4 12PLY STRL (GAUZE/BANDAGES/DRESSINGS) ×3 IMPLANT
GLOVE BIO SURGEON STRL SZ7 (GLOVE) ×3 IMPLANT
GLOVE BIOGEL PI IND STRL 7.0 (GLOVE) ×2 IMPLANT
GLOVE BIOGEL PI IND STRL 7.5 (GLOVE) ×1 IMPLANT
GLOVE BIOGEL PI INDICATOR 7.0 (GLOVE) ×4
GLOVE BIOGEL PI INDICATOR 7.5 (GLOVE) ×2
GLOVE ECLIPSE 7.0 STRL STRAW (GLOVE) ×3 IMPLANT
GLOVE EUDERMIC 7 POWDERFREE (GLOVE) ×3 IMPLANT
GOWN STRL REUS W/ TWL LRG LVL3 (GOWN DISPOSABLE) ×1 IMPLANT
GOWN STRL REUS W/ TWL XL LVL3 (GOWN DISPOSABLE) ×1 IMPLANT
GOWN STRL REUS W/TWL LRG LVL3 (GOWN DISPOSABLE) ×2
GOWN STRL REUS W/TWL XL LVL3 (GOWN DISPOSABLE) ×2
ILLUMINATOR WAVEGUIDE N/F (MISCELLANEOUS) IMPLANT
KIT BASIN OR (CUSTOM PROCEDURE TRAY) ×3 IMPLANT
KIT ROOM TURNOVER OR (KITS) ×3 IMPLANT
LIGHT WAVEGUIDE WIDE FLAT (MISCELLANEOUS) IMPLANT
NS IRRIG 1000ML POUR BTL (IV SOLUTION) ×3 IMPLANT
PACK GENERAL/GYN (CUSTOM PROCEDURE TRAY) ×3 IMPLANT
PAD ARMBOARD 7.5X6 YLW CONV (MISCELLANEOUS) ×3 IMPLANT
PLASMABLADE 3.0S (MISCELLANEOUS)
SPECIMEN JAR X LARGE (MISCELLANEOUS) ×3 IMPLANT
SUT ETHILON 3 0 FSL (SUTURE) ×3 IMPLANT
SUT MNCRL AB 4-0 PS2 18 (SUTURE) ×3 IMPLANT
SUT SILK 2 0 FS (SUTURE) ×3 IMPLANT
SUT VIC AB 3-0 SH 18 (SUTURE) ×6 IMPLANT
TOWEL OR 17X24 6PK STRL BLUE (TOWEL DISPOSABLE) ×3 IMPLANT
TOWEL OR 17X26 10 PK STRL BLUE (TOWEL DISPOSABLE) IMPLANT
TUBE CONNECTING 12'X1/4 (SUCTIONS)
TUBE CONNECTING 12X1/4 (SUCTIONS) IMPLANT

## 2015-08-20 NOTE — Clinical Social Work Note (Signed)
Clinical Social Work Assessment  Patient Details  Name: [facility] MRN: [id number] Date of Birth: 26-Oct-1924  Date of referral:  08/20/15               Reason for consult:  Facility Placement                Permission sought to share information with:  Facility Sport and exercise psychologist, Family Supports Permission granted to share information::  Yes, Verbal Permission Granted  Name::     [facility] [id number]  Agency::  [facility] Admissions  Relationship::     Contact Information:     Housing/Transportation Living arrangements for the past 2 months:  [place] of Information:  Spouse Patient Interpreter Needed:  None Criminal Activity/Legal Involvement Pertinent to Current Situation/Hospitalization:  No - Comment as needed Significant Relationships:  Spouse Lives with:  Facility Resident Do you feel safe going back to the place where you live?  Yes Need for family participation in patient care:  Yes (Comment) (Patient has dementia)   Care giving concerns:  Patient's wife does not have any concerns about having patient return to SNF.   Social Worker assessment / plan: Patient has dementia, and is only alert and oriented x1, CSW spoke to patient's wife to complete assessment.  Patient is from [facility] and has been living there for a few years.  Patient's wife expressed that she does not have any concerns or issues with patient returning back to SNF.  Patient's wife stated that she is in agreement to have patient discharge back to SNF.  Patient's wife did not have any other questions.  Employment status:  Retired Forensic scientist:  Information systems manager, Medicaid [place] PT Recommendations:  Not assessed at this time Information / Referral to community resources:  [place]  Patient/Family's Response to care:  Patient's wife in agreement to having him return back to SNF.  Patient/Family's Understanding of and Emotional  Response to Diagnosis, Current Treatment, and Prognosis:  Patient not aware of current treatment plan or prognosis but patient's wife is.  Emotional Assessment Appearance:  Appears stated age Attitude/Demeanor/Rapport:    Affect (typically observed):  Appropriate Orientation:  Oriented to Self Alcohol / Substance use:  Not Applicable Psych involvement (Current and /or in the community):  No (Comment)  Discharge Needs  Concerns to be addressed:  No discharge needs identified Readmission within the last 30 days:  No Current discharge risk:  None Barriers to Discharge:  No Barriers Identified   Ross Ludwig, LCSWA 08/20/2015, 6:27 PM

## 2015-08-20 NOTE — Anesthesia Procedure Notes (Addendum)
Procedure Name: LMA Insertion Date/Time: 08/20/2015 10:23 AM Performed by: Willeen Cass P Pre-anesthesia Checklist: Patient identified, Timeout performed, Emergency Drugs available, Suction available and Patient being monitored Patient Re-evaluated:Patient Re-evaluated prior to inductionOxygen Delivery Method: Circle system utilized Preoxygenation: Pre-oxygenation with 100% oxygen Intubation Type: IV induction Ventilation: Mask ventilation without difficulty LMA: LMA inserted LMA Size: 5.0 Number of attempts: 1 Tube secured with: Tape Dental Injury: Teeth and Oropharynx as per pre-operative assessment    Anesthesia Regional Block:  Pectoralis block  Pre-Anesthetic Checklist: ,, timeout performed, Correct Patient, Correct Site, Correct Laterality, Correct Procedure, Correct Position, site marked, Risks and benefits discussed,  Surgical consent,  Pre-op evaluation,  At surgeon's request and post-op pain management  Laterality: Left  Prep: chloraprep       Needles:  Injection technique: Single-shot  Needle Type: Echogenic Needle     Needle Length: 9cm 9 cm Needle Gauge: 22 and 22 G    Additional Needles:  Procedures: ultrasound guided (picture in chart) Pectoralis block Narrative:  Start time: 08/20/2015 9:45 AM End time: 08/20/2015 9:52 AM Injection made incrementally with aspirations every 5 mL.  Performed by: Personally  Anesthesiologist: Glennon Mac, Naketa Daddario  Additional Notes: Pt identified in Holding room.  Monitors applied. Working IV access confirmed. Sterile prep, drape L pec and clavicle.  #22ga PNS ECHOgenic needle into sub pec major space between ribs 4,5.  30cc 0.5% Bupivacaine with 1:200k epi injected incrementally after negative test dose, good spread of local anesthetic.  Patient asymptomatic, VSS, no heme aspirated, tolerated well.  Jenita Seashore, MD

## 2015-08-20 NOTE — Op Note (Signed)
Patient Name:           [facility]   Date of Surgery:        08/20/2015  Pre op Diagnosis:      Invasive cancer left breast, estrogen receptor positive  Post op Diagnosis:    Same  Procedure:                 Left total mastectomy  Surgeon:                     Edsel Petrin. Dalbert Batman, M.D., FACS  Assistant:                      Donnie Mesa, M.D., [facility]  Operative Indications:    This is a 82 year old gentleman, referred by Dr. Tressa Busman for surgical opinion regarding management of cancer of his left breast. Dr. Serita Sheller is his primary care physician. He has [facility] a [name] for 3 [facility] 4 years. His wife is with him today and she is independent and lives at home.      The wife states that she noticed a painless ulcerated lump in his left breast recently and sent him for evaluation. No drainage or odor. Mammograms and ultrasound show a highly suspicious 2 cm retroareolar left breast mass. The left axilla normal by ultrasound. 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       Dr. Jana Hakim has seen him on July 09, 2015, and discussed the increased incidence of genetic mutation with the patient and his wife and plans genetic counseling after he has surgery. He has already been started on tamoxifen.       He is alert and conversant and pleasant. He has had traumatic subdural hematoma and maybe some TIAs. He does have a diagnosis of dementia and takes Abilify and rivastigmine patch. He has BPH and takes tamsulosin capsules and finasteride tablets. He has chronic constipation and has had a fecal impaction in the past and takes senna tablets daily. He has GERD and takes Prilosec. He also takes MiraLAX once daily to prevent constipation. He takes methadone. He is pretty much dependent on a wheelchair due to arthritis. He denies chest pain,  dizziness, or shortness of breath.      I talked to the patient and his wife for a long time. They want to go ahead with the simple mastectomy.   Operative Findings:       There is a palpable mass in the central left breast behind the areola.  This feels almost 3 cm in diameter.  It has invaded the skin of the nipple with crusting but it is not draining or infected at this time.  The tumor is mobile and does not appear to be fixed to the pectoralis muscle and it was cleanly resected.  There are no palpable axillary lymph nodes.  Procedure in Detail:          Following the induction of general anesthesia the patient's left chest wall and axilla are prepped and draped in a sterile fashion.  Surgical timeout is performed.  Intravenous antibiotics were given.  A left pectoral block had been performed preoperatively by the anesthesiologist.      Using a marking pin I designed a transverse elliptical incision which would allow me to get widely around the tumor.  The transverse incision was made.  Skin flaps were raised superiorly to the infraclavicular area, laterally to the lateral chest wall near the latissimus dorsi muscle, inferiorly to the upper end of the anterior rectus sheath and medially to the parasternal area.  The breast was then dissected off of the pectoralis major and minor muscles with electrocautery.  The lateral skin margin was marked with silk suture.  In separating the axillary tail from the axillar contents I clamped divided and suture-ligated some bleeders with 3-0 silk ties and this provided excellent hemostasis.  The specimen was removed.  A couple of bleeders were controlled metal clips.  The wound was irrigated with saline.  Hemostasis was excellent.  A single 19 [place] Blake drain was placed into the wound and brought out through a separate stab incision inferolaterally and connected to suction bulb after suturing it in place securely.  The subcutaneous tissue was closed with interrupted  3-0 Vicryl and the skin was closed with a running 4-0 Monocryl and Dermabond.  Dry bandages and a breast binder were placed.  The patient tolerated the procedure well was taken to PACU in stable condition.  EBL 50 mL.  Counts correct.  Complications none.     Edsel Petrin. Dalbert Batman, M.D., FACS General and Minimally Invasive Surgery Breast and Colorectal Surgery  08/20/2015 11:23 AM

## 2015-08-20 NOTE — Transfer of Care (Signed)
Immediate Anesthesia Transfer of Care Note  Patient: [facility]  Procedure(s) Performed: Procedure(s): LEFT TOTAL MASTECTOMY (Left)  Patient Location: PACU  Anesthesia Type:General  Level of Consciousness: awake, confused and lethargic  Airway & Oxygen Therapy: Patient Spontanous Breathing and Patient connected to nasal cannula oxygen  Post-op Assessment: Report given to RN, Post -op Vital signs reviewed and stable and Patient moving all extremities  Post vital signs: Reviewed and stable  Last Vitals:  Filed Vitals:   08/20/15 0809 08/20/15 1135  BP: 145/67 144/76  Pulse: 75 96  Temp: 36.8 C 36.5 C  Resp: 18 12    Last Pain: There were no vitals filed for this visit.    Patients Stated Pain Goal: 2 ([id number])  Complications: No apparent anesthesia complications

## 2015-08-20 NOTE — NC FL2 (Signed)
[place] LEVEL OF CARE SCREENING TOOL     IDENTIFICATION  Patient Name: [facility] Birthdate: 08-May-1924 Sex: male Admission Date (Current Location): 08/20/2015  [place] and [place] Number:  Kathleen Argue [id number] [place] and Address:  [facility] [place], [place] [address] [place], [place] [address]      Provider Number: [id number]  Attending Physician Name and Address:  Fanny Skates, MD  Relative Name and Phone Number:  Dohnovan, Clarida [phone] or [phone]    Current Level of Care: Hospital Recommended Level of Care: [place] Prior Approval Number:    Date Approved/Denied:   PASRR Number: [id number]  Discharge Plan: SNF    Current Diagnoses: Patient Active Problem List   Diagnosis Date Noted  . Cancer of overlapping sites of left male breast ([place]) 07/09/2015  . Left breast mass 06/18/2015  . Vitamin D deficiency 04/21/2015  . Angioedema 01/09/2015  . Major depressive disorder, recurrent episode ([place]) 12/03/2014  . Essential hypertension, benign 11/19/2014  . Psychosis 11/19/2014  . Mood disorder ([place]) 10/31/2014  . Hereditary and idiopathic peripheral neuropathy 07/01/2014  . Insomnia 04/19/2014  . Fall 10/02/2013  . BPH (benign prostatic hyperplasia) 02/17/2013  . Osteoarthritis cervical spine 02/17/2013  . Chronic constipation   . Chronic back pain 06/08/2011  . Dementia with behavioral disturbance 06/06/2011  . Hypothyroidism   . Gastroesophageal reflux disease   . Anxiety disorder     Orientation RESPIRATION BLADDER Height & Weight     Self  O2 (2L) Continent Weight: 188 lb (85.276 kg) Height:  5\' 7"  (170.2 cm)  BEHAVIORAL SYMPTOMS/MOOD NEUROLOGICAL BOWEL NUTRITION STATUS      Continent Diet (Dysphagia 2 diet)  AMBULATORY STATUS COMMUNICATION OF NEEDS Skin   Extensive Assist Verbally Surgical wounds                       Personal Care Assistance Level of Assistance  Bathing,  Dressing Bathing Assistance: Limited assistance   Dressing Assistance: Limited assistance     Functional Limitations Info             SPECIAL CARE FACTORS FREQUENCY                       Contractures      Additional Factors Info  Psychotropic, Code Status, Allergies Code Status Info: Full Code Allergies Info: Cogentin Benztropine Psychotropic Info: ARIPiprazole (ABILIFY) tablet 15 mg         Current Medications (08/20/2015):  This is the current hospital active medication list Current Facility-Administered Medications  Medication Dose Route Frequency Provider Last Rate Last Dose  . acetaminophen (TYLENOL) tablet 650 mg  650 mg Oral Q6H PRN Fanny Skates, MD       Or  . acetaminophen (TYLENOL) suppository 650 mg  650 mg Rectal Q6H PRN Fanny Skates, MD      . Derrill Memo ON 08/21/2015] ARIPiprazole (ABILIFY) tablet 15 mg  15 mg Oral Daily Fanny Skates, MD      . Derrill Memo ON 08/21/2015] finasteride (PROSCAR) tablet 5 mg  5 mg Oral Daily Fanny Skates, MD      . gabapentin (NEURONTIN) tablet 600 mg  600 mg Oral TID Fanny Skates, MD   600 mg at 08/20/15 1600  . [START ON 08/21/2015] heparin injection 5,000 Units  5,000 Units Subcutaneous Q8H Fanny Skates, MD      . lactated ringers infusion   Intravenous Continuous Fanny Skates, MD 75 mL/hr at  08/20/15 1212    . [START ON 08/21/2015] levothyroxine (SYNTHROID, LEVOTHROID) tablet 88 mcg  88 mcg Oral QAC breakfast Fanny Skates, MD      . Derrill Memo ON 08/21/2015] linaclotide (LINZESS) capsule 290 mcg  290 mcg Oral Lissa Hoard, MD      . methadone (DOLOPHINE) tablet 5 mg  5 mg Oral Q12H Fanny Skates, MD   5 mg at 08/20/15 1600  . mirtazapine (REMERON) tablet 15 mg  15 mg Oral QHS Fanny Skates, MD      . morphine 2 MG/ML injection 0.5 mg  0.5 mg Intravenous Q2H PRN Fanny Skates, MD      . multivitamin with minerals tablet 1 tablet  1 tablet Oral Daily Fanny Skates, MD   1 tablet at 08/20/15 1600  . ondansetron  (ZOFRAN-ODT) disintegrating tablet 4 mg  4 mg Oral Q6H PRN Fanny Skates, MD       Or  . ondansetron [facility]) injection 4 mg  4 mg Intravenous Q6H PRN Fanny Skates, MD      . Derrill Memo ON 08/21/2015] pantoprazole (PROTONIX) EC tablet 40 mg  40 mg Oral Daily Fanny Skates, MD      . polyethylene glycol (MIRALAX / GLYCOLAX) packet 17 g  17 g Oral Daily Fanny Skates, MD   17 g at 08/20/15 1600  . Rivastigmine PT24 13.3 mg  1 patch Transdermal q morning - 10a Fanny Skates, MD   0 mg at 08/20/15 1245  . senna-docusate (Senokot-S) tablet 2 tablet  2 tablet Oral QHS Fanny Skates, MD      . Derrill Memo ON 08/21/2015] tamoxifen (NOLVADEX) tablet 20 mg  20 mg Oral Daily Fanny Skates, MD      . tamsulosin (FLOMAX) capsule 0.4 mg  0.4 mg Oral QHS Fanny Skates, MD      . vitamin B-12 (CYANOCOBALAMIN) tablet 1,000 mcg  1,000 mcg Oral Daily Fanny Skates, MD   1,000 mcg at 08/20/15 1600     Discharge Medications: Please see discharge summary for a list of discharge medications.  Relevant Imaging Results:  Relevant Lab Results:   Additional Information SSN [id number]  Ross Ludwig, [place]

## 2015-08-20 NOTE — Clinical Social Work Note (Signed)
CSW spoke to patient's wife [facility], who said patient is from [facility] SNF.  Patient's wife confirmed that plan is for patient to return to SNF, CSW spoke to [facility] who said they can take patient back on Thursday if he is medically ready for discharge and orders have been received.  CSW to continue to follow patient's progress throughout discharge planning, formal assessment to follow.  Jones Broom. [place], MSW, [place] 08/20/2015 2:59 PM

## 2015-08-20 NOTE — Anesthesia Postprocedure Evaluation (Signed)
Anesthesia Post Note  Patient: George Foster  Procedure(s) Performed: Procedure(s) (LRB): LEFT TOTAL MASTECTOMY (Left)  Patient location during evaluation: PACU Anesthesia Type: General and Regional Level of consciousness: awake and alert, oriented and patient cooperative Pain management: pain level controlled Vital Signs Assessment: post-procedure vital signs reviewed and stable Respiratory status: spontaneous breathing, nonlabored ventilation, respiratory function stable and patient connected to nasal cannula oxygen Cardiovascular status: blood pressure returned to baseline and stable Postop Assessment: no signs of nausea or vomiting Anesthetic complications: no    Last Vitals:  Filed Vitals:   08/20/15 1150 08/20/15 1205  BP: 125/73 136/78  Pulse: 95 95  Temp:    Resp: 11 12    Last Pain:  Filed Vitals:   08/20/15 1208  PainSc: Asleep                 Hailie Searight,E. Kaileen Bronkema

## 2015-08-20 NOTE — Anesthesia Preprocedure Evaluation (Addendum)
Anesthesia Evaluation  Patient identified by MRN, date of birth, ID band Patient awake    Reviewed: Allergy & Precautions, NPO status , Patient's Chart, lab work & pertinent test results  History of Anesthesia Complications Negative for: history of anesthetic complications  Airway Mallampati: II  TM Distance: >3 FB Neck ROM: Full    Dental  (+) Edentulous Upper, Edentulous Lower   Pulmonary neg pulmonary ROS,    breath sounds clear to auscultation       Cardiovascular hypertension,  Rhythm:Regular Rate:Normal     Neuro/Psych PSYCHIATRIC DISORDERS Anxiety Depression Chronic neck and back pain    GI/Hepatic Neg liver ROS, GERD  Medicated and Controlled,  Endo/Other  Hypothyroidism   Renal/GU negative Renal ROS     Musculoskeletal  (+) Arthritis , Osteoarthritis,    Abdominal   Peds  Hematology negative hematology ROS (+)   Anesthesia Other Findings   Reproductive/Obstetrics                            Anesthesia Physical Anesthesia Plan  ASA: III  Anesthesia Plan: General   Post-op Pain Management: GA combined w/ Regional for post-op pain   Induction: Intravenous  Airway Management Planned: LMA  Additional Equipment:   Intra-op Plan:   Post-operative Plan:   Informed Consent: I have reviewed the patients History and Physical, chart, labs and discussed the procedure including the risks, benefits and alternatives for the proposed anesthesia with the patient or authorized representative who has indicated his/her understanding and acceptance.     Plan Discussed with: CRNA and Surgeon  Anesthesia Plan Comments: (Plan routine monitors, GA- LMA OK, pectoralis block for post op analgesia)        Anesthesia Quick Evaluation

## 2015-08-20 NOTE — Care Management Note (Signed)
Case Management Note  Patient Details  Name: George Foster MRN: [id number] Date of Birth: 1924/09/02  Subjective/Objective:                    Action/Plan:  Consult for Assist with transition back to skilled nursing facility. Consulted and notified social work . Expected Discharge Date:                  Expected Discharge Plan:  Skilled Nursing Facility  In-House Referral:  Clinical Social Work  Discharge planning Services     Post Acute Care Choice:    Choice offered to:     DME Arranged:    DME Agency:     HH Arranged:    [place] Agency:     Status of Service:  Completed, signed off  Medicare Important Message Given:    Date Medicare IM Given:    Medicare IM give by:    Date Additional Medicare IM Given:    Additional Medicare Important Message give by:     If discussed at [place] of Stay Meetings, dates discussed:    Additional Comments:  Marilu Favre, RN 08/20/2015, 1:32 PM

## 2015-08-20 NOTE — Interval H&P Note (Signed)
History and Physical Interval Note:  08/20/2015 9:50 AM  George Foster  has presented today for surgery, with the diagnosis of LEFT BREAST CANCER  The various methods of treatment have been discussed with the patient and family. After consideration of risks, benefits and other options for treatment, the patient has consented to  Procedure(s): LEFT TOTAL MASTECTOMY (Left) as a surgical intervention .  The patient's history has been reviewed, patient examined, no change in status, stable for surgery.  I have reviewed the patient's chart and labs.  Questions were answered to the patient's satisfaction.     Adin Hector

## 2015-08-21 ENCOUNTER — Encounter (HOSPITAL_COMMUNITY): Payer: Self-pay | Admitting: General Surgery

## 2015-08-21 ENCOUNTER — Emergency Department (HOSPITAL_COMMUNITY)
Admission: EM | Admit: 2015-08-21 | Discharge: 2015-08-21 | Disposition: A | Discharge status: Home / Self Care | Payer: Medicare Other | Source: Home / Self Care | Attending: Emergency Medicine | Admitting: Emergency Medicine

## 2015-08-21 DIAGNOSIS — C50122 Malignant neoplasm of central portion of left male breast: Secondary | ICD-10-CM | POA: Diagnosis not present

## 2015-08-21 DIAGNOSIS — T85838A Hemorrhage due to other internal prosthetic devices, implants and grafts, initial encounter: Secondary | ICD-10-CM

## 2015-08-21 LAB — CBC
HCT: 32.6 % — ABNORMAL LOW (ref 39.0–52.0)
Hemoglobin: 10.4 g/dL — ABNORMAL LOW (ref 13.0–17.0)
MCH: 33.4 pg (ref 26.0–34.0)
MCHC: 31.9 g/dL (ref 30.0–36.0)
MCV: 104.8 fL — ABNORMAL HIGH (ref 78.0–100.0)
PLATELETS: 121 10*3/uL — AB (ref 150–400)
RBC: 3.11 MIL/uL — AB (ref 4.22–5.81)
RDW: 13.5 % (ref 11.5–15.5)
WBC: 5.6 10*3/uL (ref 4.0–10.5)

## 2015-08-21 LAB — BASIC METABOLIC PANEL
Anion gap: 3 — ABNORMAL LOW (ref 5–15)
BUN: 23 mg/dL — ABNORMAL HIGH (ref 6–20)
CALCIUM: 8.3 mg/dL — AB (ref 8.9–10.3)
CO2: 27 mmol/L (ref 22–32)
CREATININE: 1.23 mg/dL (ref 0.61–1.24)
Chloride: 109 mmol/L (ref 101–111)
GFR, EST AFRICAN AMERICAN: 58 mL/min — AB (ref >60)
GFR, EST NON AFRICAN AMERICAN: 50 mL/min — AB (ref >60)
Glucose, Bld: 91 mg/dL (ref 65–99)
Potassium: 4.3 mmol/L (ref 3.5–5.1)
Sodium: 139 mmol/L (ref 135–145)

## 2015-08-21 NOTE — ED Notes (Signed)
Patient is awake and confused, sitting on end of bed, incontinent of urine, bed linen wet and large puddle on floor.  JP intact to left mid lateral rib cage area with bloody drainage.  Dressing removed and JP site cleaned with sterile saline and new biopatch placed with clean dressing.  Complete bed linen and gown change.  Patient cleaned of urine and dried stool noted to rectal area.  Turned and positioned for comfort.  Safety sitter at bedside until PTAR arrives.  [facility] called and given report by [facility].

## 2015-08-21 NOTE — ED Notes (Signed)
Mastectomy surgical site left chest healing with no signs of infection

## 2015-08-21 NOTE — Progress Notes (Signed)
Pt d/c from [facility] 08/21/15 back to [place] and rehab Admission Diagnoses: LEFT BREAST CANCER-CENTRAL PORTION OF LEFT MALE BREAST .DEMENTIA IN ALZHEIMER'S DISEASE (G30.9) Had Operations: Procedure(s): LEFT TOTAL MASTECTOMY 08/20/15 Dr Fanny Skates

## 2015-08-21 NOTE — Discharge Instructions (Signed)
George Foster was seen in the emergency room today for evaluation of bleeding from his JP tube site. We replaced the bulb that was broken. He may follow up with Dr. Dalbert Batman in surgery clinic within one week for further evaluation

## 2015-08-21 NOTE — Clinical Social Work Note (Signed)
Patient to be d/c'ed today to [facility].  Patient and family agreeable to plans will transport via ems RN to call report to [phone].  CSW left message on patient's wife's voice mail.  Evette Cristal, MSW, [place]

## 2015-08-21 NOTE — ED Notes (Signed)
Per EMS- patient is a resident of [facility]. Staff reported that the patient had a JP drain that came out and was bleeding. EMS reports that the bleeding had stopped when they arrived at the scene.

## 2015-08-21 NOTE — ED Notes (Signed)
JP bulb not compressed. Small amount of blood in the bulb. Patient has dried blood on his clothes.

## 2015-08-21 NOTE — Care Management Obs Status (Signed)
[place] NOTIFICATION   Patient Details  Name: George Foster MRN: [id number] Date of Birth: [birth year]   Medicare Observation Status Notification Given:  Yes (OBS procedure)    Marilu Favre, RN 08/21/2015, 10:41 AM

## 2015-08-21 NOTE — ED Notes (Signed)
Bed: WA07 Expected date:  Expected time:  Means of arrival:  Comments: EMS- 82yo M, JP Drain issue

## 2015-08-21 NOTE — ED Notes (Signed)
This nurse attempted to call report to [facility] twice, there was no answer.

## 2015-08-21 NOTE — Progress Notes (Signed)
Report called to [facility].

## 2015-08-21 NOTE — Progress Notes (Signed)
Gen. Surgery:  Patient is stable and alert   Pleasant and cooperative. He states "thank you first doing such a good job with my breast surgery" No pain. Voiding well No distress Mastectomy wound looks good.  Skin flaps viable.  No hematoma or active bleeding.  JP drain functioning, low volume serosanguineous.   Plan: Discharged back to [facility] today FL 2 signed Follow-up with me in 10 days See discharge summary for details  Edsel Petrin. Dalbert Batman, M.D., [facility] General and Minimally invasive Surgery Breast and Colorectal Surgery Office:   [phone] Pager:   [phone]

## 2015-08-21 NOTE — Discharge Instructions (Signed)
[facility] °[phone] ° °MASTECTOMY: POST OP INSTRUCTIONS ° °Always review your discharge instruction sheet given to you by the facility where your surgery was performed. °IF YOU HAVE DISABILITY OR FAMILY LEAVE FORMS, YOU MUST BRING THEM TO THE OFFICE FOR PROCESSING.   °DO NOT GIVE THEM TO YOUR DOCTOR. °A prescription for pain medication may be given to you upon discharge.  Take your pain medication as prescribed, if needed.  If narcotic pain medicine is not needed, then you may take acetaminophen (Tylenol) or ibuprofen (Advil) as needed. °1. Take your usually prescribed medications unless otherwise directed. °2. If you need a refill on your pain medication, please contact your pharmacy.  They will contact our office to request authorization.  Prescriptions will not be filled after 5pm or on week-ends. °3. You should follow a light diet the first few days after arrival home, such as soup and crackers, etc.  Resume your normal diet the day after surgery. °4. Most patients will experience some swelling and bruising on the chest and underarm.  Ice packs will help.  Swelling and bruising can take several days to resolve.  °5. It is common to experience some constipation if taking pain medication after surgery.  Increasing fluid intake and taking a stool softener (such as Colace) will usually help or prevent this problem from occurring.  A mild laxative (Milk of Magnesia or Miralax) should be taken according to package instructions if there are no bowel movements after 48 hours. °6. Unless discharge instructions indicate otherwise, leave your bandage dry and in place until your next appointment in 3-5 days.  You may take a limited sponge bath.  No tube baths or showers until the drains are removed.  You may have steri-strips (small skin tapes) in place directly over the incision.  These strips should be left on the skin for 7-10 days.  If your surgeon used skin glue on the incision, you may  shower in 24 hours.  The glue will flake off over the next 2-3 weeks.  Any sutures or staples will be removed at the office during your follow-up visit. °7. DRAINS:  If you have drains in place, it is important to keep a list of the amount of drainage produced each day in your drains.  Before leaving the hospital, you should be instructed on drain care.  Call our office if you have any questions about your drains. °8. ACTIVITIES:  You may resume regular (light) daily activities beginning the next day--such as daily self-care, walking, climbing stairs--gradually increasing activities as tolerated.  You may have sexual intercourse when it is comfortable.  Refrain from any heavy lifting or straining until approved by your doctor. °a. You may drive when you are no longer taking prescription pain medication, you can comfortably wear a seatbelt, and you can safely maneuver your car and apply brakes. °b. RETURN TO WORK:  __________________________________________________________ °9. You should see your doctor in the office for a follow-up appointment approximately 3-5 days after your surgery.  Your doctor’s nurse will typically make your follow-up appointment when she calls you with your pathology report.  Expect your pathology report 2-3 business days after your surgery.  You may call to check if you do not hear from us after three days.   °10. OTHER INSTRUCTIONS: ______________________________________________________________________________________________ ____________________________________________________________________________________________ °WHEN TO CALL YOUR DOCTOR: °1. Fever over 101.0 °2. Nausea and/or vomiting °3. Extreme swelling or bruising °4. Continued bleeding from incision. °5. Increased pain, redness, or drainage from the incision. °  The clinic staff is available to answer your questions during regular business hours.  Please don’t hesitate to call and ask to speak to one of the nurses for clinical  concerns.  If you have a medical emergency, go to the nearest emergency room or call 911.  A surgeon from [facility] is always on call at the hospital. °[address], [address], [place], [place]  [address] ? [address] [address], [place], [place]   [address] °[phone] ? [phone] ? FAX [phone] °Web site: [email or web address] °

## 2015-08-21 NOTE — ED Notes (Signed)
[facility]PA notified that a new JP drain obtained from the OR and placed at the bedside.

## 2015-08-21 NOTE — Progress Notes (Signed)
CSW met with patient at bedside. Patient was not effectively communicative. Per note, pt has a history of dementia and presents to [facility] due to JP drain issue.  Per note, pt is from [facility] and rehab.  Willette Brace [phone] ED CSW 08/21/2015 7:04 PM

## 2015-08-21 NOTE — Discharge Summary (Signed)
Patient ID: George Foster [id number] 82 y.o. 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 Tressa Busman for surgical opinion regarding management of cancer of his left breast. Dr. Serita Sheller is his primary care physician. He has been a resident of [name] for 3 or 4 years. His wife is with him today and she is independent and lives at home.  The wife states that she noticed a painless ulcerated lump in his left breast recently and sent him for evaluation. No drainage or odor. Mammograms and ultrasound show a highly suspicious 2 cm retroareolar left breast mass. The left axilla normal by ultrasound. 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  Dr. Jana Hakim has seen him on July 09, 2015, and discussed the increased incidence of genetic mutation with the patient and his wife and plans genetic counseling after he has surgery. He has already been started on tamoxifen.   He is alert and conversant and pleasant. He has had traumatic subdural hematoma and maybe some TIAs. He does have a diagnosis of dementia and takes Abilify and rivastigmine patch. He has BPH and takes tamsulosin capsules and finasteride tablets. He has chronic constipation and  has had a fecal impaction in the past and takes senna tablets daily. He has GERD and takes Prilosec. He also takes MiraLAX once daily to prevent constipation. He takes methadone. He is pretty much dependent on a wheelchair due to arthritis. He denies chest pain, dizziness, or shortness of breath.  I talked to the patient and his wife for a long time. They want to go ahead with the simple mastectomy.  Hospital Course: On the day of admission the patient was taken to the operating room and underwent a left total mastectomy.  Operative findings were as follows:There is a palpable mass in the central left breast behind the areola. This feels almost 3 cm in diameter. It has invaded the skin of the nipple with crusting but it is not draining or infected at this time. The tumor is mobile and does not appear to be fixed to the pectoralis muscle and it was cleanly resected. There are no palpable axillary lymph nodes.     The patient was observed overnight and did well.  There were no apparent complications.  On postop day 1 the patient was awake, alert, comfortable.  He denied pain.  Conversing normally although had impaired memory.  Speech was normal.  He was voiding without difficulty.  There was no nausea or vomiting.  The left mastectomy  wound looked good with healthy skin flaps, no hematoma.  The drain was functioning normally with thin serosanguineous output.    Clinical social worker was actively involved and was making arrangements for transfer back to [facility]  SNF.  FL 2 was signed    He is discharged back to his skilled nursing facility on POD #1.  Discharge instructions were detailed in writing for his caregivers and his wife.  He will resume all of his usual medications.  I did not start any new medications.     He will follow-up with me in approximately 10 days.     He will continue to take his tamoxifen antiestrogen therapy     He will follow-up with Dr. Ray Church as an outpatient.        Consults: None  Significant Diagnostic Studies: Surgical pathology, pending  Treatments: surgery: Left mastectomy  Disposition: Nursing Home  Patient Instructions:    Medication List    TAKE these medications        ARIPiprazole 15 MG tablet  Commonly known as:  ABILIFY  Take 15 mg by mouth daily.     cyanocobalamin 1000 MCG tablet  Take 1,000 mcg by mouth daily.     ergocalciferol 50000 units capsule  Commonly known as:  VITAMIN D2  Take 50,000 Units by mouth once a week. Every monday     finasteride 5 MG tablet  Commonly known as:  PROSCAR  Take 5 mg by mouth daily.     gabapentin 600 MG tablet  Commonly known as:  NEURONTIN  Take 600 mg by mouth 3 (three) times daily.     levothyroxine 88 MCG tablet  Commonly known as:  SYNTHROID, LEVOTHROID  Take 88 mcg by mouth daily before breakfast.     LINZESS 290 MCG Caps capsule  Generic drug:  linaclotide  Take 290 mcg by mouth every morning.     methadone 5 MG tablet  Commonly known as:  DOLOPHINE  Take one tablet by mouth every 12 hours for pain     mirtazapine 15 MG tablet  Commonly known as:  REMERON  Take 15 mg by mouth at bedtime.     multivitamins ther. w/minerals Tabs tablet  Take 1 tablet by mouth daily.     omeprazole 40 MG capsule  Commonly known as:  PRILOSEC  Take 40 mg by mouth daily.     polyethylene glycol packet  Commonly known as:  MIRALAX / GLYCOLAX  Take 17 g by mouth daily.     predniSONE 10 MG tablet  Commonly known as:  DELTASONE  Take 10 mg by mouth daily with breakfast.     Rivastigmine 13.3 MG/24HR Pt24  Place 1 patch onto the skin every morning.     senna-docusate 8.6-50 MG tablet  Commonly known as:  Senokot-S  Take 2 tablets by mouth at bedtime.     tamoxifen 20 MG tablet  Commonly known as:  NOLVADEX  Take 20 mg by mouth daily.     tamsulosin 0.4 MG Caps capsule  Commonly known as:  FLOMAX  Take 0.4 mg by mouth at bedtime.        Activity: activity as  tolerated Diet: low fat, low cholesterol diet Wound Care: as directed  Follow-up:  With  Dr. Dalbert Batman in 10 days.     Signed: Edsel Petrin. Dalbert Batman, M.D., FACS General and minimally invasive surgery Breast and Colorectal Surgery  08/21/2015, 6:22 AM

## 2015-08-21 NOTE — ED Provider Notes (Signed)
CSN: [id number]     Arrival date & time 08/21/15  1624 History   First MD Initiated Contact with Patient 08/21/15 1637     Chief Complaint  Patient presents with  . JP drain out     HPI   George Foster is an 82 y.o. male with dementia and recently diagnosed left breast cancer who presents to the ED from [facility] for evaluation of bleeding around his JP drain. He was discharged from the hospital yesterday after simple left mastectomy on 5/31with JP drain placement. He was found at his rehab facility today to have blood around his drain site and the cap of the bulb had been broken off and nowhere to be found. Pt states he accidentally pulled on the drain which likely caused it to bleed. Bleeding is now controlled and pt states he has no complaints. Denies chest pain or SOB. Due to pt's dementia he is a poor historian so I spoke with his nurse at [facility] who agrees with the above history. Pt was found today with bleeding at his drain site and blood in his chest binder. It appeared to staff memebers that pt had accidentally or attempted to pull the drain out.  Past Medical History  Diagnosis Date  . Dementia   . Hypertension   . Arthritis   . Hypothyroidism   . Chronic back pain   . Anxiety disorder   . Gastroesophageal reflux disease   . Polypharmacy     History of hospitalizations for altered mental status related to medications  . Chronic constipation     with fecal impactions, recurrent  . Suicidal ideation 06/06/2011  . Traumatic subdural hematoma ([place]) 10/01/2013  . Multiple facial fractures ([place]) 10/02/2013  . Basilar skull fracture ([place]) 10/02/2013  . Acute blood loss anemia 10/02/2013  . Neuropathy ([place])   . Depression   . Mood disorder ([place])   . BPH (benign prostatic hyperplasia)   . Tremor   . Osteoarthritis   . Anxiety   . UTI (lower urinary tract infection)   . Cancer of left breast [facility])    Past Surgical History  Procedure Laterality Date  . Appendectomy    .  Multiple tooth extractions      Removal of Nonrestorable teeth numbers 2, 4, 6, 12, 13, 18, 29  . Skin surgery      Cancer cells removed  . Prostate surgery      removed x2  . Eye surgery    . Mastectomy  08/20/2015  . Total mastectomy Left 08/20/2015    Procedure: LEFT TOTAL MASTECTOMY;  Surgeon: Fanny Skates, MD;  Location: [facility];  Service: General;  Laterality: Left;   Family History  Problem Relation Age of Onset  . Heart disease Father    Social History  Substance Use Topics  . Smoking status: Never Smoker   . Smokeless tobacco: Never Used  . Alcohol Use: No     Comment: Pt denies    Review of Systems  Unable to perform ROS: Dementia      Allergies  Cogentin  Home Medications   Prior to Admission medications   Medication Sig Start Date End Date Taking? Authorizing Provider  ARIPiprazole (ABILIFY) 15 MG tablet Take 15 mg by mouth daily.    Historical Provider, MD  cyanocobalamin 1000 MCG tablet Take 1,000 mcg by mouth daily.     Historical Provider, MD  ergocalciferol (VITAMIN D2) 50000 units capsule Take 50,000 Units by mouth once a week.  Every monday    Historical Provider, MD  finasteride (PROSCAR) 5 MG tablet Take 5 mg by mouth daily.    Historical Provider, MD  gabapentin (NEURONTIN) 600 MG tablet Take 600 mg by mouth 3 (three) times daily.     Historical Provider, MD  levothyroxine (SYNTHROID, LEVOTHROID) 88 MCG tablet Take 88 mcg by mouth daily before breakfast.     Historical Provider, MD  Linaclotide (LINZESS) 290 MCG CAPS capsule Take 290 mcg by mouth every morning.     Historical Provider, MD  methadone (DOLOPHINE) 5 MG tablet Take one tablet by mouth every 12 hours for pain 08/19/15   Estill Dooms, MD  mirtazapine (REMERON) 15 MG tablet Take 15 mg by mouth at bedtime.    Historical Provider, MD  Multiple Vitamins-Minerals (MULTIVITAMINS THER. W/MINERALS) TABS Take 1 tablet by mouth daily.    Historical Provider, MD  omeprazole (PRILOSEC) 40 MG capsule  Take 40 mg by mouth daily.    Historical Provider, MD  polyethylene glycol (MIRALAX / GLYCOLAX) packet Take 17 g by mouth daily. Patient taking differently: Take 17 g by mouth at bedtime.  02/24/14   Gerlene Fee, NP  predniSONE (DELTASONE) 10 MG tablet Take 10 mg by mouth daily with breakfast.    Historical Provider, MD  Rivastigmine 13.3 MG/24HR PT24 Place 1 patch onto the skin every morning.    Historical Provider, MD  senna-docusate (SENOKOT-S) 8.6-50 MG per tablet Take 2 tablets by mouth at bedtime. Patient taking differently: Take 1 tablet by mouth at bedtime.  09/26/14   Gerlene Fee, NP  tamoxifen (NOLVADEX) 20 MG tablet Take 20 mg by mouth daily.    Historical Provider, MD  tamsulosin (FLOMAX) 0.4 MG CAPS Take 0.4 mg by mouth at bedtime.     Historical Provider, MD   BP 116/76 mmHg  Pulse 81  Temp(Src) 98.8 F (37.1 C) (Oral)  Resp 18  SpO2 100% Physical Exam  Constitutional: He is oriented to person, place, and time. No distress.  HENT:  Head: Atraumatic.  Right Ear: External ear normal.  Left Ear: External ear normal.  Nose: Nose normal.  Eyes: Conjunctivae are normal. No scleral icterus.  Neck: Normal range of motion. Neck supple.  Cardiovascular: Normal rate and regular rhythm.   Pulmonary/Chest: Effort normal. No respiratory distress. He exhibits no tenderness.  Abdominal: Soft. He exhibits no distension. There is no tenderness.  Neurological: He is alert and oriented to person, place, and time.  Skin: Skin is warm and dry. He is not diaphoretic.  JP drain in place with dried blood on surrounding skin and in chest binder. Serosanguinous fluid in tube and in bulb. The cap of the bulb is missing. No surrounding erythema. No purulent drainage. nontender  Psychiatric: He has a normal mood and affect. His behavior is normal.  Nursing note and vitals reviewed.   ED Course  Procedures (including critical care time) Labs Review Labs Reviewed - No data to  display  Imaging Review Chest 2 View  08/20/2015  CLINICAL DATA:  Preop left mastectomy. EXAM: CHEST  2 VIEW COMPARISON:  10/30/2014 FINDINGS: Lungs are adequately inflated with minimal linear scarring over the left base. No consolidation or effusion. Cardiomediastinal silhouette is within normal. There is minimal calcified plaque over the aortic arch. There are moderate degenerative changes of the spine. IMPRESSION: No active cardiopulmonary disease. Electronically Signed   By: Marin Olp M.D.   On: 08/20/2015 08:31   I have personally reviewed and evaluated  these images and lab results as part of my medical decision-making.   EKG Interpretation None      MDM   Final diagnoses:  JP drain bleeding, initial encounter    No evidence of surrounding cellulitis or of tube obstruction. I spoke with Dr. Marlou Starks of gen surg who recommended calling the OR for replacement bulb, otherwise f/u with Dr. Dalbert Batman in clinic. Replacement bulb was received and replaced. Will d/c home back to [name] with instructions to f/u with Dr. Dalbert Batman. ER return precautions given.    Anne Ng, PA-C 08/21/15 2128  Forde Dandy, MD 08/22/15 [phone]

## 2015-08-22 ENCOUNTER — Encounter: Payer: Self-pay | Admitting: Adult Health

## 2015-08-22 ENCOUNTER — Non-Acute Institutional Stay (SKILLED_NURSING_FACILITY): Payer: Medicare Other | Admitting: Adult Health

## 2015-08-22 DIAGNOSIS — C50822 Malignant neoplasm of overlapping sites of left male breast: Secondary | ICD-10-CM

## 2015-08-22 DIAGNOSIS — F03918 Unspecified dementia, unspecified severity, with other behavioral disturbance: Secondary | ICD-10-CM

## 2015-08-22 DIAGNOSIS — F333 Major depressive disorder, recurrent, severe with psychotic symptoms: Secondary | ICD-10-CM

## 2015-08-22 DIAGNOSIS — K5900 Constipation, unspecified: Secondary | ICD-10-CM | POA: Diagnosis not present

## 2015-08-22 DIAGNOSIS — I10 Essential (primary) hypertension: Secondary | ICD-10-CM | POA: Diagnosis not present

## 2015-08-22 DIAGNOSIS — M549 Dorsalgia, unspecified: Secondary | ICD-10-CM

## 2015-08-22 DIAGNOSIS — K5909 Other constipation: Secondary | ICD-10-CM

## 2015-08-22 DIAGNOSIS — G8929 Other chronic pain: Secondary | ICD-10-CM | POA: Diagnosis not present

## 2015-08-22 DIAGNOSIS — G609 Hereditary and idiopathic neuropathy, unspecified: Secondary | ICD-10-CM

## 2015-08-22 DIAGNOSIS — M47812 Spondylosis without myelopathy or radiculopathy, cervical region: Secondary | ICD-10-CM | POA: Diagnosis not present

## 2015-08-22 DIAGNOSIS — K219 Gastro-esophageal reflux disease without esophagitis: Secondary | ICD-10-CM

## 2015-08-22 DIAGNOSIS — F0391 Unspecified dementia with behavioral disturbance: Secondary | ICD-10-CM

## 2015-08-22 NOTE — Progress Notes (Signed)
Patient ID: George Foster, male   DOB: October 04, 1924, 82 y.o.   MRN: [id number]   Location:  [place] Room Number: 217-A Place of Service:  SNF (31)   CODE STATUS: DNR  Allergies  Allergen Reactions  . Cogentin [Benztropine] Other (See Comments)    unkown    Chief Complaint  Patient presents with  . Hospitalization Follow-up    Hospital follow up    HPI:  He is a long term resident of this facility who has been hospitalized for a left breast mastectomy due to breast cancer. He tells me today that he is not feeling good. He had to go to the ED yesterday as the JP drain came apart. He is begin followed by oncology for his left breast cancer. There are no nursing concerns at this time.    Past Medical History  Diagnosis Date  . Dementia   . Hypertension   . Arthritis   . Hypothyroidism   . Chronic back pain   . Anxiety disorder   . Gastroesophageal reflux disease   . Polypharmacy     History of hospitalizations for altered mental status related to medications  . Chronic constipation     with fecal impactions, recurrent  . Suicidal ideation 06/06/2011  . Traumatic subdural hematoma ([place]) 10/01/2013  . Multiple facial fractures ([place]) 10/02/2013  . Basilar skull fracture ([place]) 10/02/2013  . Acute blood loss anemia 10/02/2013  . Neuropathy ([place])   . Depression   . Mood disorder ([place])   . BPH (benign prostatic hyperplasia)   . Tremor   . Osteoarthritis   . Anxiety   . UTI (lower urinary tract infection)   . Cancer of left breast [facility])     Past Surgical History  Procedure Laterality Date  . Appendectomy    . Multiple tooth extractions      Removal of Nonrestorable teeth numbers 2, 4, 6, 12, 13, 18, 29  . Skin surgery      Cancer cells removed  . Prostate surgery      removed x2  . Eye surgery    . Mastectomy  08/20/2015  . Total mastectomy Left 08/20/2015    Procedure: LEFT TOTAL MASTECTOMY;  Surgeon: Fanny Skates, MD;  Location: [place];  Service: General;  Laterality: Left;    Social History   Social History  . Marital Status: Married    Spouse Name: Donia Guiles  . Number of Children: 5  . Years of Education: 12   Occupational History  . Retired Librarian, academic in a [place]  . Smoking status: Never Smoker   . Smokeless tobacco: Never Used  . Alcohol Use: No     Comment: Pt denies  . Drug Use: No     Comment: Pt denies  . Sexual Activity: Not on file   Other Topics Concern  . Not on file   Social History Narrative   Married.  Lives in [place] with wife.  Ambulates with a walker.   Family History  Problem Relation Age of Onset  . Heart disease Father       VITAL SIGNS BP 127/74 mmHg  Pulse 73  Temp(Src) 97.8 F (36.6 C) (Oral)  Resp 19  Ht 5\' 7"  (1.702 m)  Wt 188 lb (85.276 kg)  BMI 29.44 kg/m2  SpO2 94%  Patient's Medications  New Prescriptions   No medications on file  Previous Medications   ARIPIPRAZOLE (ABILIFY) 15  MG TABLET    Take 15 mg by mouth daily.   CYANOCOBALAMIN 1000 MCG TABLET    Take 1,000 mcg by mouth daily.    FINASTERIDE (PROSCAR) 5 MG TABLET    Take 5 mg by mouth daily.   GABAPENTIN (NEURONTIN) 600 MG TABLET    Take 600 mg by mouth 3 (three) times daily.    LEVOTHYROXINE (SYNTHROID, LEVOTHROID) 88 MCG TABLET    Take 88 mcg by mouth daily before breakfast.    LINACLOTIDE (LINZESS) 290 MCG CAPS CAPSULE    Take 290 mcg by mouth every morning.    METHADONE (DOLOPHINE) 5 MG TABLET    Take one tablet by mouth every 12 hours for pain   MIRTAZAPINE (REMERON) 15 MG TABLET    Take 15 mg by mouth at bedtime.   MULTIPLE VITAMINS-MINERALS (MULTIVITAMINS THER. W/MINERALS) TABS    Take 1 tablet by mouth daily.   OMEPRAZOLE (PRILOSEC) 40 MG CAPSULE    Take 40 mg by mouth daily.   POLYETHYLENE GLYCOL (MIRALAX / GLYCOLAX) PACKET    Take 17 g by mouth daily.   RIVASTIGMINE 13.3 MG/24HR PT24    Place 1 patch onto the skin every morning.   SENNA-DOCUSATE (SENOKOT-S)  8.6-50 MG PER TABLET    Take 2 tablets by mouth at bedtime.   TAMOXIFEN (NOLVADEX) 20 MG TABLET    Take 20 mg by mouth daily.   TAMSULOSIN (FLOMAX) 0.4 MG CAPS    Take 0.4 mg by mouth at bedtime.   Modified Medications   No medications on file  Discontinued Medications   ERGOCALCIFEROL (VITAMIN D2) 50000 UNITS CAPSULE    Take 50,000 Units by mouth once a week. Reported on 08/22/2015     SIGNIFICANT DIAGNOSTIC EXAMS  10-24-14: chest x-ray: Mild scarring left base. No edema or consolidation. No pneumothorax evident  10-24-14:lumbar spine x-ray: Multilevel osteoarthritic change. Scoliosis. No acute fracture or spondylolisthesis.  10-24-14 thoracic spine x-ray: No acute fracture or listhesis identified in the thoracic spine. Multilevel degenerative disease, moderate to severe.  10-24-14: ct of head and cervical spine:  CT HEAD:  Remote left zygomatic arch fracture.  No acute calvarial fracture. No intracranial hemorrhage. Small vessel disease type changes without CT evidence of large acute Infarct. Atrophy without hydrocephalus.   CT CERVICAL SPINE: No cervical spine fracture. Cervical spondylotic changes with spinal stenosis most notable C5-6 with mild cord compression greater on the left.  10-30-14: chest x-ray: Mild scarring left base.  No edema or consolidation.  02-04-15: chest x-ray; negative for acute cardiopulmonary disease   06-09-15: left breat ultrasound: solid mass left breast 1.9 x 1.9 x 2.5 cm suspicious of malignancy   06-13-15: diagnostic mammogram: Highly suspicious 2 cm retroareolar left breast mass -tissue sampling is recommended.  06-18-15: left breast biopsy: Ultrasound guided biopsy of a subareolar left breast mass. No apparent complications.   08-20-15: chest x-ray: No active cardiopulmonary disease     LABS REVIEWED:   10-24-14: wbc 3.8; hgb 11.9; hct 35.9; mcv 100.8; plt 159; glucose 137; bun 25; creat 1.18; k+3.7; na++137 10-30-14: wbc 4.9; hgb 12.5; hct 37.4; mcv  103.6; plt 180; glucose 115; bun 22; creat 1.11; k+4.5; na++138; liver normal albumin 4.0  10-31-14:wbc 5.9; hgb 13.3; hct 40.6; mcv 106; plt 180 glucose 168; bun 23; creat 1.17; k+ 4.6; na++138 liver normal albumin 4.0; vit b12: 306; tsh 2.02;  vit d 42.5; hgb a1c 5.2 11-14-14: wbc 4.3; hgb 10.7; hct 34.5 ;mcv 107; plt 174; glucose 124; bun  24; creat 1.05; k+4.5; na++140; liver normal albumin 3.3  12-07-14: klebsiella pneumoniae: levaquin  02-04-15: wbc 9.7; hgb 11.0; hct 32.6; mcv 100.3; plt 156; glucose 185; bun 28.1;creat1.5  k+4.3; na++137; liver normal albumin 3.4; influenza A/B: neg 03-26-15: wbc 4.7; hgb 11.4; hct 35; plt 149; glucose 88; bun 24; creat 1.1; k+ 4.0; na++144 tsh 1.27; vit d 22.37; vit B12 440  08-20-15: wbc 5.8; hgb 12.5; hct 38.4; mcv 102.7; plt 133; glucose 100; bun 25; creat 1.24; k+ 4.2; na++ 141; liver normal albumin 4.0 08-21-15; wbc 5.6; hgb 10.4; hct 32.6; mcv 10.4;8 plt 121; glucose 91; bun 23; creat 1.23; k+ 4.3; na++ 139     Review of Systems  Constitutional: negative for fatigue . Negative for appetite change.  HENT: . Negative for congestion.    Respiratory: Negative for cough, chest tightness and shortness of breath.   Cardiovascular: Negative for chest pain, palpitations and leg swelling.  Gastrointestinal: Negative for nausea, abdominal pain, diarrhea and constipation.  Musculoskeletal: has pain from left breast mastectomy  Skin: Negative for pallor.  Neurological: negative for vertigo  Psychiatric/Behavioral: The patient is not nervous/anxious.       Physical Exam Constitutional: He is oriented to person, place, and time. He appears well-developed and well-nourished. No distress.  Eyes: Pupils are equal, round, and reactive to light.  Neck: Neck supple. No JVD present. No thyromegaly present.  Cardiovascular: Normal rate, regular rhythm and intact distal pulses.   Respiratory: Effort normal and breath sounds normal. No respiratory distress.  GI: Soft.  Bowel sounds are normal. He exhibits no distension. There is no tenderness.  Musculoskeletal: He exhibits no edema.  Is able to move all extremities Has limited range of motion in neck Has mild tenderness to palpation to lower back   Neurological: He is alert and oriented to person, place, and time.  Skin: Skin is warm and dry. He is not diaphoretic.  Left breast mastectomy: incision line without signs of infection; jp drain in place; has abdominal binder in place Psychiatric: He has a normal mood and affect.      ASSESSMENT/ PLAN:  1. Chronic back pain: with cervical osteoarthritis: his pain is presently being managed on his current regimen of: methadone 5 mg twice daily; will continue neurontin 600 mg three times daily   2. BPH without obstruction: will continue proscar 5 mg daily and flomax 0.4 mg daily   3. Hypothyroidism: will continue synthroid 88 mcg daily his tsh is 2.02   4. GERD; will continue prilosec 40 mg daily   5. Chronic constipation: related to narcotic use: will continue  linzess 290 mcg daily; miralax 17 gm daily; and senna s 2 tabs nightly   6. Dementia: is without change in status; will continue exelon patch 13.3 mg daily   7. Psychosis with anxiety: will continue abilify 15 mg daily  And remeron 15 mg nightly to help with his sleep will monitor his status.   8. Hypertension: is stable is presently not on medications; will not make changes and will monitor his status.   9. Left breast cancer: is status post left breast mastectomy  He has been started on tamoxifen 20 mg daily is followed by oncology     Time spent with patient  50  minutes >50% time spent counseling; reviewing medical record; tests; labs; and developing future plan of care      Ok Edwards NP [facility]  Contact [phone] Monday through Friday 8am- 5pm  After hours  call [phone]

## 2015-08-26 NOTE — Progress Notes (Signed)
Quick Note:  Inform patient of Pathology report or his P.O.A. Breast cancer completely resected with clear margins. Good news  hmi ______

## 2015-09-01 ENCOUNTER — Encounter: Payer: Self-pay | Admitting: Internal Medicine

## 2015-09-01 ENCOUNTER — Non-Acute Institutional Stay (SKILLED_NURSING_FACILITY): Payer: Medicare Other | Admitting: Internal Medicine

## 2015-09-01 DIAGNOSIS — K219 Gastro-esophageal reflux disease without esophagitis: Secondary | ICD-10-CM | POA: Diagnosis not present

## 2015-09-01 DIAGNOSIS — C50822 Malignant neoplasm of overlapping sites of left male breast: Secondary | ICD-10-CM

## 2015-09-01 DIAGNOSIS — G609 Hereditary and idiopathic neuropathy, unspecified: Secondary | ICD-10-CM

## 2015-09-01 DIAGNOSIS — F0391 Unspecified dementia with behavioral disturbance: Secondary | ICD-10-CM

## 2015-09-01 DIAGNOSIS — E034 Atrophy of thyroid (acquired): Secondary | ICD-10-CM | POA: Diagnosis not present

## 2015-09-01 DIAGNOSIS — F333 Major depressive disorder, recurrent, severe with psychotic symptoms: Secondary | ICD-10-CM

## 2015-09-01 DIAGNOSIS — N400 Enlarged prostate without lower urinary tract symptoms: Secondary | ICD-10-CM | POA: Diagnosis not present

## 2015-09-01 DIAGNOSIS — E559 Vitamin D deficiency, unspecified: Secondary | ICD-10-CM

## 2015-09-01 DIAGNOSIS — E038 Other specified hypothyroidism: Secondary | ICD-10-CM | POA: Diagnosis not present

## 2015-09-01 DIAGNOSIS — F03918 Unspecified dementia, unspecified severity, with other behavioral disturbance: Secondary | ICD-10-CM

## 2015-09-01 NOTE — Progress Notes (Signed)
MRN: [id number] Name: George Foster  Sex: male Age: 82 y.o. DOB: 1924-06-27  [place] #:  Facility/Room: [facility] / 217 A Level Of Care: SNF Provider: Noah Delaine. Sheppard Coil, MD Emergency Contacts: Extended Emergency Contact Information Primary Emergency Contact: Trinda Pascal Address: [address]          [address], [place] [address] Johnnette Litter of [place] Phone: [phone] Mobile Phone: [phone] Relation: Spouse  Code Status: DNR  Allergies: Cogentin  Chief Complaint  Patient presents with  . New Admit To SNF    Admit to Facility    HPI: Patient is 82 y.o. male whoreferred by Dr. Gunnar Bulla Magrinat for surgical opinion regarding management of cancer of his left breast. Dr. Serita Sheller is his primary care physician. He has been a resident of [name] for 3 or 4 years. His wife is with him today and she is independent and lives at home.  The wife states that she noticed a painless ulcerated lump in his left breast recently and sent him for evaluation. No drainage or odor. Mammograms and ultrasound show a highly suspicious 2 cm retroareolar left breast mass. The left axilla normal by ultrasound. 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  Dr. Jana Hakim has seen him on July 09, 2015, and discussed the increased incidence of genetic mutation with the patient and his wife and plans genetic counseling after he has surgery. He has already been started on tamoxifen.   He is alert and conversant and pleasant. He has had traumatic subdural hematoma and maybe some TIAs. He does have a diagnosis of dementia and takes Abilify and rivastigmine patch. He has BPH and takes tamsulosin capsules and finasteride tablets. He has chronic constipation and has had a fecal impaction in the past and takes senna tablets daily. He has GERD and takes  Prilosec. He also takes MiraLAX once daily to prevent constipation. He takes methadone. He is pretty much dependent on a wheelchair due to arthritis. He denies chest pain, dizziness, or shortness of breath.  I talked to the patient and his wife for a long time. They want to go ahead with the simple mastectomy.Pt was admitted to [facility] from 5/31-6/1 where he underwent above named surgery with no reported complications. Pt is admitted back to SNF for residential care. While at SNF pt will be followed for dementia, tx with rivostigmine patch, BPH, tx with flomax and finasteride and GERD, tx with prilosec.  Past Medical History  Diagnosis Date  . Dementia   . Hypertension   . Arthritis   . Hypothyroidism   . Chronic back pain   . Anxiety disorder   . Gastroesophageal reflux disease   . Polypharmacy     History of hospitalizations for altered mental status related to medications  . Chronic constipation     with fecal impactions, recurrent  . Suicidal ideation 06/06/2011  . Traumatic subdural hematoma ([place]) 10/01/2013  . Multiple facial fractures ([place]) 10/02/2013  . Basilar skull fracture ([place]) 10/02/2013  . Acute blood loss anemia 10/02/2013  . Neuropathy ([place])   . Depression   . Mood disorder ([place])   . BPH (benign prostatic hyperplasia)   . Tremor   . Osteoarthritis   . Anxiety   . UTI (lower urinary tract infection)   . Cancer of left breast [facility])     Past Surgical History  Procedure Laterality Date  . Appendectomy    .  Multiple tooth extractions      Removal of Nonrestorable teeth numbers 2, 4, 6, 12, 13, 18, 29  . Skin surgery      Cancer cells removed  . Prostate surgery      removed x2  . Eye surgery    . Mastectomy  08/20/2015  . Total mastectomy Left 08/20/2015    Procedure: LEFT TOTAL MASTECTOMY;  Surgeon: Fanny Skates, MD;  Location: [facility];  Service: General;  Laterality: Left;      Medication List       This list is accurate as of: 09/01/15 11:59 PM.  Always  use your most recent med list.               ARIPiprazole 15 MG tablet  Commonly known as:  ABILIFY  Take 15 mg by mouth daily.     cyanocobalamin 1000 MCG tablet  Take 1,000 mcg by mouth daily.     ergocalciferol 50000 units capsule  Commonly known as:  VITAMIN D2  Take 50,000 Units by mouth once a week. Every Monday     finasteride 5 MG tablet  Commonly known as:  PROSCAR  Take 5 mg by mouth daily.     gabapentin 600 MG tablet  Commonly known as:  NEURONTIN  Take 600 mg by mouth 3 (three) times daily.     levothyroxine 88 MCG tablet  Commonly known as:  SYNTHROID, LEVOTHROID  Take 88 mcg by mouth daily before breakfast.     LINZESS 290 MCG Caps capsule  Generic drug:  linaclotide  Take 290 mcg by mouth every morning.     methadone 5 MG tablet  Commonly known as:  DOLOPHINE  Take one tablet by mouth every 12 hours for pain     mirtazapine 15 MG tablet  Commonly known as:  REMERON  Take 15 mg by mouth at bedtime.     multivitamins ther. w/minerals Tabs tablet  Take 1 tablet by mouth daily.     omeprazole 40 MG capsule  Commonly known as:  PRILOSEC  Take 40 mg by mouth daily.     polyethylene glycol packet  Commonly known as:  MIRALAX / GLYCOLAX  Take 17 g by mouth daily.     predniSONE 10 MG tablet  Commonly known as:  DELTASONE  Take 10 mg by mouth daily with breakfast.     Rivastigmine 13.3 MG/24HR Pt24  Place 1 patch onto the skin every morning.     sennosides-docusate sodium 8.6-50 MG tablet  Commonly known as:  SENOKOT-S  Take 2 tablets by mouth at bedtime.     tamoxifen 20 MG tablet  Commonly known as:  NOLVADEX  Take 20 mg by mouth daily.     tamsulosin 0.4 MG Caps capsule  Commonly known as:  FLOMAX  Take 0.4 mg by mouth at bedtime.        Meds ordered this encounter  Medications  . ergocalciferol (VITAMIN D2) 50000 units capsule    Sig: Take 50,000 Units by mouth once a week. Every Monday  . polyethylene glycol (MIRALAX /  GLYCOLAX) packet    Sig: Take 17 g by mouth daily.  . predniSONE (DELTASONE) 10 MG tablet    Sig: Take 10 mg by mouth daily with breakfast.  . sennosides-docusate sodium (SENOKOT-S) 8.6-50 MG tablet    Sig: Take 2 tablets by mouth at bedtime.    Immunization History  Administered Date(s) Administered  . Influenza-Unspecified 10/08/2013  . PPD Test 10/07/2013  .  Tdap 09/27/2013    Social History  Substance Use Topics  . Smoking status: Never Smoker   . Smokeless tobacco: Never Used  . Alcohol Use: No     Comment: Pt denies    Family history is   Family History  Problem Relation Age of Onset  . Heart disease Father       Review of Systems  DATA OBTAINED: from patient GENERAL:  no fevers, fatigue, appetite changes SKIN: No itching, rash or wounds EYES: No eye pain, redness, discharge EARS: No earache, tinnitus, change in hearing NOSE: No congestion, drainage or bleeding  MOUTH/THROAT: No mouth or tooth pain, No sore throat RESPIRATORY: No cough, wheezing, SOB CARDIAC: No chest pain, palpitations, lower extremity edema  GI: No abdominal pain, No N/V/D or constipation, No heartburn or reflux  GU: No dysuria, frequency or urgency, or incontinence  MUSCULOSKELETAL: No unrelieved bone/joint pain NEUROLOGIC: No headache, dizziness or focal weakness PSYCHIATRIC: No c/o anxiety or sadness   Filed Vitals:   09/01/15 1037  BP: 119/59  Pulse: 71  Temp: 96.6 F (35.9 C)  Resp: 20    SpO2 Readings from Last 1 Encounters:  09/01/15 97%        Physical Exam  GENERAL APPEARANCE: Alert, conversant,  No acute distress; looks a little post-op weak but otherwise his usual self  SKIN: No diaphoresis rash;incisions look good, dressed HEAD: Normocephalic, atraumatic  EYES: Conjunctiva/lids clear. Pupils round, reactive. EOMs intact.  EARS: External exam WNL, canals clear. Hearing grossly normal.  NOSE: No deformity or discharge.  MOUTH/THROAT: Lips w/o lesions   RESPIRATORY: Breathing is even, unlabored. Lung sounds are clear   CARDIOVASCULAR: Heart RRR no murmurs, rubs or gallops. No peripheral edema.   GASTROINTESTINAL: Abdomen is soft, non-tender, not distended w/ normal bowel sounds. GENITOURINARY: Bladder non tender, not distended  MUSCULOSKELETAL: No abnormal joints or musculature NEUROLOGIC:  Cranial nerves 2-12 grossly intact. Moves all extremities  PSYCHIATRIC: Mood and affect appropriate to situation, no behavioral issues  Patient Active Problem List   Diagnosis Date Noted  . Cancer of overlapping sites of left male breast ([place]) 07/09/2015  . Vitamin D deficiency 04/21/2015  . Angioedema 01/09/2015  . Major depressive disorder, recurrent episode ([place]) 12/03/2014  . Essential hypertension, benign 11/19/2014  . Psychosis 11/19/2014  . Mood disorder ([place]) 10/31/2014  . Hereditary and idiopathic peripheral neuropathy 07/01/2014  . Insomnia 04/19/2014  . Fall 10/02/2013  . BPH (benign prostatic hyperplasia) 02/17/2013  . Osteoarthritis cervical spine 02/17/2013  . Chronic constipation   . Chronic back pain 06/08/2011  . Dementia with behavioral disturbance 06/06/2011  . Hypothyroidism   . Gastroesophageal reflux disease   . Anxiety disorder        Component Value Date/Time   WBC 5.6 08/21/2015 0240   WBC 4.7 03/25/2015   RBC 3.11* 08/21/2015 0240   HGB 10.4* 08/21/2015 0240   HCT 32.6* 08/21/2015 0240   PLT 121* 08/21/2015 0240   MCV 104.8* 08/21/2015 0240   LYMPHSABS 1.5 08/20/2015 0809   MONOABS 0.5 08/20/2015 0809   EOSABS 0.3 08/20/2015 0809   BASOSABS 0.0 08/20/2015 0809        Component Value Date/Time   NA 139 08/21/2015 0240   NA 144 03/25/2015   K 4.3 08/21/2015 0240   CL 109 08/21/2015 0240   CO2 27 08/21/2015 0240   GLUCOSE 91 08/21/2015 0240   BUN 23* 08/21/2015 0240   BUN 24* 03/25/2015   CREATININE 1.23 08/21/2015 0240   CREATININE  1.1 03/25/2015   CALCIUM 8.3* 08/21/2015 0240   PROT 6.8  08/20/2015 0809   ALBUMIN 4.0 08/20/2015 0809   AST 24 08/20/2015 0809   ALT 15* 08/20/2015 0809   ALKPHOS 49 08/20/2015 0809   BILITOT 0.9 08/20/2015 0809   GFRNONAA 50* 08/21/2015 0240   GFRAA 58* 08/21/2015 0240    No results found for: HGBA1C  No results found for: CHOL, HDL, LDLCALC, LDLDIRECT, TRIG, CHOLHDL   No results found.  Not all labs, radiology exams or other studies done during hospitalization come through on my EPIC note; however they are reviewed by me.    Assessment and Plan  Cancer of overlapping sites of left male breast [facility]) SNF - s/p mastectomy;doing well;cont tamoxifen  Gastroesophageal reflux disease SNF - stable;cont prilosec 40 mg  daily  Hypothyroidism SNF - stable;cont synthroid 88 mcg daily  Dementia with behavioral disturbance SNF - stable;cont exelon patch  Hereditary and idiopathic peripheral neuropathy SNF - stable;cont neurontin 600 mg tID  BPH (benign prostatic hyperplasia) SNF - chronic and stable ; cont flomax and proscar  Major depressive disorder, recurrent episode SNF - stable;cont abilify and remerpon   Time spent > 35 min;> 50% of time with patient was spent reviewing records, labs, tests and studies, counseling and developing plan of care  Webb Silversmith D. Sheppard Coil, MD

## 2015-09-05 ENCOUNTER — Other Ambulatory Visit: Payer: Self-pay | Admitting: *Deleted

## 2015-09-05 DIAGNOSIS — C50822 Malignant neoplasm of overlapping sites of left male breast: Secondary | ICD-10-CM

## 2015-09-08 ENCOUNTER — Other Ambulatory Visit: Payer: Self-pay

## 2015-09-08 ENCOUNTER — Encounter: Payer: Self-pay | Admitting: Internal Medicine

## 2015-09-08 DIAGNOSIS — C50822 Malignant neoplasm of overlapping sites of left male breast: Secondary | ICD-10-CM

## 2015-09-08 NOTE — Assessment & Plan Note (Signed)
SNF - stable;cont prilosec 40 mg  daily

## 2015-09-08 NOTE — Assessment & Plan Note (Signed)
SNF - stable;cont neurontin 600 mg tID

## 2015-09-08 NOTE — Assessment & Plan Note (Signed)
SNF - stable;cont 50,000 u weekly

## 2015-09-08 NOTE — Assessment & Plan Note (Signed)
SNF - stable;cont abilify and remerpon

## 2015-09-08 NOTE — Assessment & Plan Note (Addendum)
SNF - stable;cont synthroid 88 mcg daily

## 2015-09-08 NOTE — Assessment & Plan Note (Signed)
SNF - s/p mastectomy;doing well;cont tamoxifen

## 2015-09-08 NOTE — Assessment & Plan Note (Signed)
SNF - chronic and stable ; cont flomax and proscar

## 2015-09-08 NOTE — Assessment & Plan Note (Signed)
SNF - stable;cont exelon patch

## 2015-09-09 ENCOUNTER — Ambulatory Visit (HOSPITAL_BASED_OUTPATIENT_CLINIC_OR_DEPARTMENT_OTHER): Payer: Medicare Other | Admitting: Genetic Counselor

## 2015-09-09 ENCOUNTER — Encounter: Payer: Self-pay | Admitting: Genetic Counselor

## 2015-09-09 ENCOUNTER — Ambulatory Visit (HOSPITAL_BASED_OUTPATIENT_CLINIC_OR_DEPARTMENT_OTHER): Payer: Medicare Other | Admitting: Oncology

## 2015-09-09 ENCOUNTER — Other Ambulatory Visit: Payer: Medicare Other

## 2015-09-09 VITALS — BP 120/67 | HR 64 | Temp 98.6°F | Resp 18 | Ht 67.0 in

## 2015-09-09 DIAGNOSIS — Z803 Family history of malignant neoplasm of breast: Secondary | ICD-10-CM | POA: Diagnosis not present

## 2015-09-09 DIAGNOSIS — Z800 Family history of malignant neoplasm of digestive organs: Secondary | ICD-10-CM | POA: Diagnosis not present

## 2015-09-09 DIAGNOSIS — Z809 Family history of malignant neoplasm, unspecified: Secondary | ICD-10-CM

## 2015-09-09 DIAGNOSIS — C50822 Malignant neoplasm of overlapping sites of left male breast: Secondary | ICD-10-CM

## 2015-09-09 DIAGNOSIS — Z170 Estrogen receptor positive status [ER+]: Secondary | ICD-10-CM

## 2015-09-09 DIAGNOSIS — Z85820 Personal history of malignant melanoma of skin: Secondary | ICD-10-CM | POA: Diagnosis not present

## 2015-09-09 NOTE — Progress Notes (Signed)
[facility]  [phone] [phone]     ID: George Foster DOB: 1924/03/26  MR#: [id number]  OXB#:[id number]  Patient Care Team: [name], MD as PCP - General (Internal Medicine) Gerlene Fee, NP as Nurse Practitioner (Nurse Practitioner) [facility] ([place]) Chauncey Cruel, MD as Consulting Physician (Oncology) Fanny Skates, MD as Consulting Physician (General Surgery) PCP: [name], MD OTHER MD:  CHIEF COMPLAINT: Estrogen receptor positive left breast cancer  CURRENT TREATMENT: Tamoxifen   BREAST CANCER HISTORY: From the original intake note:  George Foster tells me there has been some change in his left breast for several months and this was eventually brought to medical attention and on 06/13/2015 he underwent bilateral diagnostic mammography with tomosynthesis and left breast ultrasonography. The breast density was category 8. On the left side there was nipple retraction and increased density in the retroareolar left breast. The right breast was unremarkable. On physical exam the left nipple again was retracted and there was a hard retroareolar mass. On ultrasound this mass measured 2.0 cm. There were no abnormal left axillary lymph nodes. Evaluation was technically difficult low as the patient could not fully abductor his left arm.  Biopsy of the left breast mass in question 06/18/2015 showed ([id number]) an invasive ductal carcinoma, grade 2, estrogen receptor 90% positive, progesterone receptor 80% positive, both with strong staining intensity, with an MIB-1 of 60%, and no HER-2 amplification, the signals ratio being 1.65 and the number per cell 2.15.  The patient's subsequent history is as detailed below  INTERVAL HISTORY: George Foster returns today for follow-up of his lobular breast cancer, accompanied by his wife George Foster. Since his last visit here he underwent left simple mastectomy on  08/20/2015. The final pathology ([id number]) confirmed a 2.7 cm invasive lobular carcinoma, grade 3, which was estrogen receptor 90% positive with moderate staining intensity, progesterone receptor 90% positive with strong staining intensity, and HER-2 nonamplified, with a signals ratio of 1.50, the number per cell being 2.25.  In addition he met with genetics counseling on 09/09/2015 to discuss genetics testing.   He was started on tamoxifen in March 2017. He is tolerating this well, with no side effects that he is aware of.  REVIEW OF SYSTEMS: He had some postoperative pain but that has resolved. He did not have significant bleeding or problems with fever. He does have problems with stool incontinence and tries to regulate himself so he has a bowel movement every morning. This morning it was interrupted so he ended up having a Dent. He does have arthritis pains here and there which are not more intense or persistent than before. He feels forgetful but not confused. A detailed review of systems today was otherwise stable  PAST MEDICAL HISTORY: Past Medical History  Diagnosis Date  . Dementia   . Hypertension   . Arthritis   . Hypothyroidism   . Chronic back pain   . Anxiety disorder   . Gastroesophageal reflux disease   . Polypharmacy     History of hospitalizations for altered mental status related to medications  . Chronic constipation     with fecal impactions, recurrent  . Suicidal ideation 06/06/2011  . Traumatic subdural hematoma ([place]) 10/01/2013  . Multiple facial fractures ([place]) 10/02/2013  . Basilar skull fracture ([place]) 10/02/2013  . Acute blood loss anemia 10/02/2013  . Neuropathy ([place])   . Depression   . Mood disorder ([place])   . BPH (benign  prostatic hyperplasia)   . Tremor   . Osteoarthritis   . Anxiety   . UTI (lower urinary tract infection)   . Cancer of left breast ([place])     PAST SURGICAL HISTORY: Past Surgical History  Procedure Laterality Date  . Appendectomy     . Multiple tooth extractions      Removal of Nonrestorable teeth numbers 2, 4, 6, 12, 13, 18, 29  . Skin surgery      Cancer cells removed  . Prostate surgery      removed x2  . Eye surgery    . Mastectomy  08/20/2015  . Total mastectomy Left 08/20/2015    Procedure: LEFT TOTAL MASTECTOMY;  Surgeon: Fanny Skates, MD;  Location: [facility];  Service: General;  Laterality: Left;    FAMILY HISTORY Family History  Problem Relation Age of Onset  . Heart disease Father   . Cancer Maternal Grandmother     dx. 90s - NOS type  . Breast cancer Paternal Grandmother     dx later in life  . Throat cancer Son 40    pt's wife said he is NOT aware of this!  The patient's father died at the age of 49 from heart disease. He apparently had no sisters. The patient's mother died from complications of diabetes at the age of 59. The patient has one brother, and one sister, both still living. There is no history of breast or ovarian cancer in the family to his knowledge.  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 He currently resides at [facility]. He has 2 children from an earlier marriage, George Foster and George Foster. George Foster works as a Statistician and George Foster is retired from working for [employer]. Both live in [place]. The patient has "no telling" how many grandchildren. His wife of 66 years, George Foster, is originally from [place]. She works at [employer]. She has 2 children of her own, George Foster and George Foster. Pedis and posterior and done as a roofer. Both live in [place]. She has or grandchildren of her own.    ADVANCED DIRECTIVES: DO NOT RESUSCITATE in place   HEALTH MAINTENANCE: Social History  Substance Use Topics  . Smoking status: Never Smoker   . Smokeless tobacco: Never Used  . Alcohol Use: No     Comment: Pt denies     Colonoscopy:  PSA:  Bone density:  Lipid panel:  Allergies  Allergen Reactions  . Cogentin [Benztropine] Other (See  Comments)    unkown    Current Outpatient Prescriptions  Medication Sig Dispense Refill  . ARIPiprazole (ABILIFY) 15 MG tablet Take 15 mg by mouth daily.    . cyanocobalamin 1000 MCG tablet Take 1,000 mcg by mouth daily.     . ergocalciferol (VITAMIN D2) 50000 units capsule Take 50,000 Units by mouth once a week. Every Monday    . finasteride (PROSCAR) 5 MG tablet Take 5 mg by mouth daily.    Marland Kitchen gabapentin (NEURONTIN) 600 MG tablet Take 600 mg by mouth 3 (three) times daily.     Marland Kitchen levothyroxine (SYNTHROID, LEVOTHROID) 88 MCG tablet Take 88 mcg by mouth daily before breakfast.     . Linaclotide (LINZESS) 290 MCG CAPS capsule Take 290 mcg by mouth every morning.     . methadone (DOLOPHINE) 5 MG tablet Take one tablet by mouth every 12 hours for pain 60 tablet 0  . mirtazapine (REMERON) 15 MG tablet Take 15 mg by mouth  at bedtime.    . Multiple Vitamins-Minerals (MULTIVITAMINS THER. W/MINERALS) TABS Take 1 tablet by mouth daily.    Marland Kitchen omeprazole (PRILOSEC) 40 MG capsule Take 40 mg by mouth daily.    . polyethylene glycol (MIRALAX / GLYCOLAX) packet Take 17 g by mouth daily.    . predniSONE (DELTASONE) 10 MG tablet Take 10 mg by mouth daily with breakfast.    . Rivastigmine 13.3 MG/24HR PT24 Place 1 patch onto the skin every morning.    . sennosides-docusate sodium (SENOKOT-S) 8.6-50 MG tablet Take 2 tablets by mouth at bedtime.    . tamoxifen (NOLVADEX) 20 MG tablet Take 20 mg by mouth daily.    . tamsulosin (FLOMAX) 0.4 MG CAPS Take 0.4 mg by mouth at bedtime.      No current facility-administered medications for this visit.    OBJECTIVE: Older white man examined in a wheelchair Filed Vitals:   09/09/15 1530  BP: 120/67  Pulse: 64  Temp: 98.6 F (37 C)  Resp: 18     There is no weight on file to calculate BMI.    ECOG FS:2 - Symptomatic, <50% confined to bed  Sclerae unicteric, EOMs intact Oropharynx clear, slightly dry No cervical or supraclavicular adenopathy Lungs no rales or  rhonchi Heart regular rate and rhythm Abd soft, nontender, positive bowel sounds MSK no focal spinal tenderness, no upper extremity lymphedema Neuro: nonfocal, well oriented, pleasant affect Breasts: I do not palpate any suspicious masses in the right breast. The left breast is status post mastectomy. The incision has healed nicely. There is no evidence of local recurrence. The left axilla is benign.   LAB RESULTS:  CMP     Component Value Date/Time   NA 139 08/21/2015 0240   NA 144 03/25/2015   K 4.3 08/21/2015 0240   CL 109 08/21/2015 0240   CO2 27 08/21/2015 0240   GLUCOSE 91 08/21/2015 0240   BUN 23* 08/21/2015 0240   BUN 24* 03/25/2015   CREATININE 1.23 08/21/2015 0240   CREATININE 1.1 03/25/2015   CALCIUM 8.3* 08/21/2015 0240   PROT 6.8 08/20/2015 0809   ALBUMIN 4.0 08/20/2015 0809   AST 24 08/20/2015 0809   ALT 15* 08/20/2015 0809   ALKPHOS 49 08/20/2015 0809   BILITOT 0.9 08/20/2015 0809   GFRNONAA 50* 08/21/2015 0240   GFRAA 58* 08/21/2015 0240    INo results found for: SPEP, UPEP  Lab Results  Component Value Date   WBC 5.6 08/21/2015   NEUTROABS 3.6 08/20/2015   HGB 10.4* 08/21/2015   HCT 32.6* 08/21/2015   MCV 104.8* 08/21/2015   PLT 121* 08/21/2015      Chemistry      Component Value Date/Time   NA 139 08/21/2015 0240   NA 144 03/25/2015   K 4.3 08/21/2015 0240   CL 109 08/21/2015 0240   CO2 27 08/21/2015 0240   BUN 23* 08/21/2015 0240   BUN 24* 03/25/2015   CREATININE 1.23 08/21/2015 0240   CREATININE 1.1 03/25/2015   GLU 88 03/25/2015      Component Value Date/Time   CALCIUM 8.3* 08/21/2015 0240   ALKPHOS 49 08/20/2015 0809   AST 24 08/20/2015 0809   ALT 15* 08/20/2015 0809   BILITOT 0.9 08/20/2015 0809       No results found for: LABCA2  No components found for: IOEVO350  No results for input(s): INR in the last 168 hours.  Urinalysis    Component Value Date/Time   COLORURINE YELLOW 08/20/2015 0945  APPEARANCEUR CLEAR  08/20/2015 0945   LABSPEC 1.024 08/20/2015 0945   PHURINE 5.5 08/20/2015 0945   GLUCOSEU NEGATIVE 08/20/2015 0945   HGBUR NEGATIVE 08/20/2015 0945   BILIRUBINUR NEGATIVE 08/20/2015 0945   KETONESUR NEGATIVE 08/20/2015 0945   PROTEINUR NEGATIVE 08/20/2015 0945   UROBILINOGEN 1.0 10/30/2014 1639   NITRITE NEGATIVE 08/20/2015 0945   LEUKOCYTESUR NEGATIVE 08/20/2015 0945      ELIGIBLE FOR AVAILABLE RESEARCH PROTOCOL: no  STUDIES: Chest 2 View  08/20/2015  CLINICAL DATA:  Preop left mastectomy. EXAM: CHEST  2 VIEW COMPARISON:  10/30/2014 FINDINGS: Lungs are adequately inflated with minimal linear scarring over the left base. No consolidation or effusion. Cardiomediastinal silhouette is within normal. There is minimal calcified plaque over the aortic arch. There are moderate degenerative changes of the spine. IMPRESSION: No active cardiopulmonary disease. Electronically Signed   By: Marin Olp M.D.   On: 08/20/2015 08:31    ASSESSMENT: 82 y.o. [place] man with a left breast cancer, overlapping sites, status post biopsy 06/18/2015 for a clinical T2 N0, stage IA invasive ductal carcinoma, grade 2, estrogen and progesterone receptor positive, HER-2 nonamplified, with an MIB-1 of 60%.  (1) tamoxifen started 07/09/2015  (2) status post left mastectomy 08/20/2015 for a pT2 pNX, stage II invasive lobular carcinoma, grade 3, with negative margins, repeat HER-2 again negative, estrogen and progesterone receptor again positive.  (3) genetics testing  Discussed 09/10/2015; the patient opted against genetics testing. His children and siblings will be tested through their own physicians   PLAN: George Foster did very well with the surgery, and he is tolerating tamoxifen well. The plan at this point is to continue tamoxifen for a total of 5 years.  He has a good understanding of the possible toxicities, side effects and complications of that agent. The one concern would be a blood clot, and  particularly if he develops unilateral lower extremity swelling that would be something he would want alert [place] to.  He met with the genetics counselors today. There are financial issues relating to testing. He decided not to get tested himself. He tells me his children plan to go to their doctors to get tested and his brother and sister plan to do the same. I requested that they send [place] their results if they do obtain some.  Otherwise he will follow up with his surgeon next week. He is going to see me again in October and I will then see him again in April, then once a year. I am not planning to do yearly mammography on him but simply yearly examinations  He knows to call for any problems that may develop before his next visit here.   Chauncey Cruel, MD   09/09/2015 3:39 PM Medical Oncology and Hematology [facility] [address] [place], [place] [address] Tel. [phone]    Fax. [phone]

## 2015-09-09 NOTE — Progress Notes (Signed)
REFERRING PROVIDER: Lurline Del, MD  PRIMARY PROVIDER:  [name], MD  PRIMARY REASON FOR VISIT:  1. Cancer of overlapping sites of left male breast ([place])   2. History of melanoma   3. Family history of breast cancer in male   50. Family history of throat cancer   5. Family history of cancer      HISTORY OF PRESENT ILLNESS:   [name], a 82 y.o. male, was seen for a [place] cancer genetics consultation at the request of Dr. Jana Hakim due to a personal history of breast and melanoma cancers and family history of breast and other cancers.  Mr. Crass presents to clinic today with his wife to discuss the possibility of a hereditary predisposition to cancer, genetic testing, and to further clarify his future cancer risks, as well as potential cancer risks for family members.   In March 2017, at the age of 15, Mr. [name] was diagnosed with invasive lobular carcinoma of the left breast.  Hormone receptor status is ER/PR+, Her2-. This was treated with left mastectomy in May 2017 and he is currently on tamoxifen.  Mr. Coltrane also reports a history of melanoma skin cancer on his back.  He also has a history of benign prostatic hyperplasia, for which he has needed a couple of surgeries.    RISK FACTORS:  Colonoscopy: no; not examined. Mammogram within the last year: has never needed a breast mammogram prior to his work-up for cancer. Number of breast biopsies: 1. Up to date with prostate exams:  yes. Any excessive radiation exposure/other exposures in the past:  No, but does have a history of secondhand smoke exposure (wife smokes)  Past Medical History  Diagnosis Date  . Dementia   . Hypertension   . Arthritis   . Hypothyroidism   . Chronic back pain   . Anxiety disorder   . Gastroesophageal reflux disease   . Polypharmacy     History of hospitalizations for altered mental status related to medications  . Chronic constipation     with fecal impactions, recurrent  . Suicidal  ideation 06/06/2011  . Traumatic subdural hematoma ([place]) 10/01/2013  . Multiple facial fractures ([place]) 10/02/2013  . Basilar skull fracture ([place]) 10/02/2013  . Acute blood loss anemia 10/02/2013  . Neuropathy ([place])   . Depression   . Mood disorder ([place])   . BPH (benign prostatic hyperplasia)   . Tremor   . Osteoarthritis   . Anxiety   . UTI (lower urinary tract infection)   . Cancer of left breast [facility])     Past Surgical History  Procedure Laterality Date  . Appendectomy    . Multiple tooth extractions      Removal of Nonrestorable teeth numbers 2, 4, 6, 12, 13, 18, 29  . Skin surgery      Cancer cells removed  . Prostate surgery      removed x2  . Eye surgery    . Mastectomy  08/20/2015  . Total mastectomy Left 08/20/2015    Procedure: LEFT TOTAL MASTECTOMY;  Surgeon: Fanny Skates, MD;  Location: [facility];  Service: General;  Laterality: Left;    Social History   Social History  . Marital Status: Married    Spouse Name: Donia Guiles  . Number of Children: 5  . Years of Education: 12   Occupational History  . Retired Librarian, academic in a [employer]  . Smoking status: Never Smoker   . Smokeless tobacco:  Never Used  . Alcohol Use: No     Comment: Pt denies  . Drug Use: No     Comment: Pt denies  . Sexual Activity: Not on file   Other Topics Concern  . Not on file   Social History Narrative   Married.  Lives in  with wife.  Ambulates with a walker.     FAMILY HISTORY:  We obtained a detailed, 4-generation family history.  Significant diagnoses are listed below: Family History  Problem Relation Age of Onset  . Heart disease Father   . Cancer Maternal Grandmother     dx. 90s - NOS type  . Breast cancer Paternal Grandmother     dx later in life  . Throat cancer Son 58    pt's wife said he is NOT aware of this!    Mr. Bangerter has two sons, Randy and [name], who are 58 and 57.  His wife has three children from a previous partnership.  He has  approximately 25 grandchildren--only a few of whom are adopted.  He and his wife report no known cancer history for any of the grandchildren.  Mr. Sappenfield's wife pulled the genetic counseling intern aside, however, and did admit that Mr. Lyman's son Randy has been diagnosed with throat cancer and that Mr. Koors himself is not aware because she has not told him.  Mr. Cryder has two full siblings--one brother and one sister.  His brother is currently 84; his sister is 65--neither have had cancer.  Mr. Mutch's mother passed away at 88 in a nursing home.  His father died at 89, but did not have cancer.    Mr. Leidy's father was an only child.  His mother was diagnosed with breast cancer at a later age and passed away in her 90s.  His father died at 94 with no known history of cancer.  He has no further information for any paternal great aunts/uncles or great grandparents.    Mr. Pulsifer's mother had seven full sisters (one of these was a twin sister) and two full brothers, all of whom have passed away.  Most of these aunts/uncles passed away at later ages, and Mr. Marinaccio and his wife report no known history of cancer for them.  He has no information for his maternal first cousins.  His maternal grandmother died of an unspecified type of cancer in her 90s.  His grandfather died due to age-related causes around 99.  He has no further information for any maternal great aunts/uncles or great grandparents.  Mr. Bordeau and his wife are unaware of any previous history of genetic testing of family members for hereditary cancer.  Patient's maternal ancestors and paternal ancestors are of Caucasian and English descent. There is no reported Ashkenazi Jewish ancestry. There is no known consanguinity.  GENETIC COUNSELING ASSESSMENT: Sammie R Mclaren is a 82 y.o. male with a personal and family history of breast cancer which is somewhat suggestive of a hereditary breast cancer syndrome and predisposition to cancer. We, therefore, discussed and  recommended the following at today's visit.   DISCUSSION: We reviewed the characteristics, features and inheritance patterns of hereditary cancer syndromes, particularly those caused by mutations within the BRCA1/2 genes. We also discussed genetic testing, including the appropriate family members to test, and the process of genetic testing.  Mr. Holthaus is the most appropriate family member to have genetic testing due to his personal history of male breast cancer, a rare cancer that can be linked to a   genetic cause.  He is eligible to have genetic testing based on his history and the medical management options (such as breast MRIs) that could be taken should he test positive for a genetic mutation that increases his future risk for cancer, per his medical oncologist and recommendations for high-risk screening options.  We reviewed this with Mr. Seelig and his wife.  At this time, they are not interested in genetic testing, but they have shared the appropriate risk information with Mr. Dura's sons and other relatives, letting them know that genetic testing is still available to them.  Mr. Mariscal and his wife were provided with our contact information, so they can contact us, should they change their mind and wish to have genetic testing.  PLAN: Despite our recommendation, Mr. Frie did not wish to pursue genetic testing at today's visit. We understand this decision, and remain available to coordinate genetic testing at any time in the future. We, therefore, recommend Mr. Vigo continue to follow the cancer screening guidelines given by his primary healthcare provider.  We reviewed that genetic testing is still available to his sons, siblings, and likely other relatives.  Lastly, we encouraged Mr. Valeri to remain in contact with cancer genetics annually so that we can continuously update the family history and inform him of any changes in cancer genetics and testing that may be of benefit for this family.   Mr.  Gaus's  questions were answered to his satisfaction today. Our contact information was provided should additional questions or concerns arise. Thank you for the referral and allowing us to share in the care of your patient.   Kayla Boggs, MS, CGC Certified Genetic Counselor [email or web address] Phone: [phone]  The patient was seen for a total of 30 minutes in face-to-face genetic counseling.  This patient was discussed with Drs. Magrinat, Gudena and/or Feng who agrees with the above.    _______________________________________________________________________ For Office Staff:  Number of people involved in session: 3 Was an Intern/ student involved with case: yes    

## 2015-09-11 ENCOUNTER — Other Ambulatory Visit: Payer: Self-pay | Admitting: Oncology

## 2015-09-12 ENCOUNTER — Encounter: Payer: Self-pay | Admitting: Nurse Practitioner

## 2015-09-12 DIAGNOSIS — C50822 Malignant neoplasm of overlapping sites of left male breast: Secondary | ICD-10-CM

## 2015-09-12 NOTE — Progress Notes (Signed)
The Survivorship Care Plan was mailed to [name] as he reported not being able to come in to the Survivorship Clinic for an in-person visit at this time. A letter was mailed to him outlining the purpose of the content of the care plan, as well as encouraging him to reach out to me with any questions or concerns.  My business card was included in the correspondence to the patient as well.  A copy of the care plan was also routed/faxed/mailed to George Duos, MD, the patient's PCP.  I will not be placing any follow-up appointments to the Survivorship Clinic for George Foster, but I am happy to see him at any time in the future for any survivorship concerns that may arise. Thank you for allowing me to participate in his care!  Kenn File, [place] [phone]

## 2015-09-20 ENCOUNTER — Inpatient Hospital Stay (HOSPITAL_COMMUNITY): Payer: Medicare Other

## 2015-09-20 ENCOUNTER — Encounter (HOSPITAL_COMMUNITY): Payer: Self-pay

## 2015-09-20 ENCOUNTER — Inpatient Hospital Stay (HOSPITAL_COMMUNITY)
Admission: EM | Admit: 2015-09-20 | Discharge: 2015-09-26 | DRG: 177 | Disposition: A | Discharge status: Skilled Nursing Facility | Payer: Medicare Other | Attending: Internal Medicine | Admitting: Internal Medicine

## 2015-09-20 ENCOUNTER — Emergency Department (HOSPITAL_COMMUNITY): Payer: Medicare Other

## 2015-09-20 DIAGNOSIS — R4182 Altered mental status, unspecified: Secondary | ICD-10-CM | POA: Diagnosis present

## 2015-09-20 DIAGNOSIS — K219 Gastro-esophageal reflux disease without esophagitis: Secondary | ICD-10-CM | POA: Diagnosis present

## 2015-09-20 DIAGNOSIS — R410 Disorientation, unspecified: Secondary | ICD-10-CM | POA: Diagnosis not present

## 2015-09-20 DIAGNOSIS — Z23 Encounter for immunization: Secondary | ICD-10-CM | POA: Diagnosis not present

## 2015-09-20 DIAGNOSIS — Z66 Do not resuscitate: Secondary | ICD-10-CM | POA: Diagnosis not present

## 2015-09-20 DIAGNOSIS — J690 Pneumonitis due to inhalation of food and vomit: Principal | ICD-10-CM | POA: Diagnosis present

## 2015-09-20 DIAGNOSIS — G629 Polyneuropathy, unspecified: Secondary | ICD-10-CM | POA: Diagnosis present

## 2015-09-20 DIAGNOSIS — I10 Essential (primary) hypertension: Secondary | ICD-10-CM | POA: Diagnosis present

## 2015-09-20 DIAGNOSIS — G9340 Encephalopathy, unspecified: Secondary | ICD-10-CM | POA: Diagnosis not present

## 2015-09-20 DIAGNOSIS — J9601 Acute respiratory failure with hypoxia: Secondary | ICD-10-CM | POA: Diagnosis not present

## 2015-09-20 DIAGNOSIS — G92 Toxic encephalopathy: Secondary | ICD-10-CM | POA: Diagnosis not present

## 2015-09-20 DIAGNOSIS — Z79899 Other long term (current) drug therapy: Secondary | ICD-10-CM | POA: Diagnosis not present

## 2015-09-20 DIAGNOSIS — Z79810 Long term (current) use of selective estrogen receptor modulators (SERMs): Secondary | ICD-10-CM | POA: Diagnosis not present

## 2015-09-20 DIAGNOSIS — F0391 Unspecified dementia with behavioral disturbance: Secondary | ICD-10-CM | POA: Diagnosis not present

## 2015-09-20 DIAGNOSIS — J189 Pneumonia, unspecified organism: Secondary | ICD-10-CM | POA: Diagnosis present

## 2015-09-20 DIAGNOSIS — D696 Thrombocytopenia, unspecified: Secondary | ICD-10-CM | POA: Diagnosis present

## 2015-09-20 DIAGNOSIS — K5909 Other constipation: Secondary | ICD-10-CM | POA: Diagnosis present

## 2015-09-20 DIAGNOSIS — R1310 Dysphagia, unspecified: Secondary | ICD-10-CM | POA: Diagnosis not present

## 2015-09-20 DIAGNOSIS — Z79891 Long term (current) use of opiate analgesic: Secondary | ICD-10-CM

## 2015-09-20 DIAGNOSIS — E860 Dehydration: Secondary | ICD-10-CM | POA: Diagnosis present

## 2015-09-20 DIAGNOSIS — F0390 Unspecified dementia without behavioral disturbance: Secondary | ICD-10-CM | POA: Diagnosis present

## 2015-09-20 DIAGNOSIS — Z8249 Family history of ischemic heart disease and other diseases of the circulatory system: Secondary | ICD-10-CM

## 2015-09-20 DIAGNOSIS — Z853 Personal history of malignant neoplasm of breast: Secondary | ICD-10-CM

## 2015-09-20 DIAGNOSIS — Z9012 Acquired absence of left breast and nipple: Secondary | ICD-10-CM | POA: Diagnosis not present

## 2015-09-20 DIAGNOSIS — D638 Anemia in other chronic diseases classified elsewhere: Secondary | ICD-10-CM | POA: Diagnosis not present

## 2015-09-20 DIAGNOSIS — M549 Dorsalgia, unspecified: Secondary | ICD-10-CM | POA: Diagnosis present

## 2015-09-20 DIAGNOSIS — E039 Hypothyroidism, unspecified: Secondary | ICD-10-CM | POA: Diagnosis present

## 2015-09-20 DIAGNOSIS — N400 Enlarged prostate without lower urinary tract symptoms: Secondary | ICD-10-CM | POA: Diagnosis present

## 2015-09-20 DIAGNOSIS — F329 Major depressive disorder, single episode, unspecified: Secondary | ICD-10-CM | POA: Diagnosis not present

## 2015-09-20 DIAGNOSIS — L8990 Pressure ulcer of unspecified site, unspecified stage: Secondary | ICD-10-CM | POA: Insufficient documentation

## 2015-09-20 DIAGNOSIS — G894 Chronic pain syndrome: Secondary | ICD-10-CM | POA: Diagnosis not present

## 2015-09-20 DIAGNOSIS — F4323 Adjustment disorder with mixed anxiety and depressed mood: Secondary | ICD-10-CM | POA: Diagnosis not present

## 2015-09-20 DIAGNOSIS — G8929 Other chronic pain: Secondary | ICD-10-CM

## 2015-09-20 LAB — COMPREHENSIVE METABOLIC PANEL
ALBUMIN: 3.6 g/dL (ref 3.5–5.0)
ALT: 17 U/L (ref 17–63)
ANION GAP: 8 (ref 5–15)
AST: 39 U/L (ref 15–41)
Alkaline Phosphatase: 45 U/L (ref 38–126)
BILIRUBIN TOTAL: 1.2 mg/dL (ref 0.3–1.2)
BUN: 32 mg/dL — AB (ref 6–20)
CHLORIDE: 109 mmol/L (ref 101–111)
CO2: 23 mmol/L (ref 22–32)
Calcium: 8.3 mg/dL — ABNORMAL LOW (ref 8.9–10.3)
Creatinine, Ser: 1.57 mg/dL — ABNORMAL HIGH (ref 0.61–1.24)
GFR calc Af Amer: 43 mL/min — ABNORMAL LOW (ref >60)
GFR calc non Af Amer: 37 mL/min — ABNORMAL LOW (ref >60)
GLUCOSE: 146 mg/dL — AB (ref 65–99)
POTASSIUM: 4.3 mmol/L (ref 3.5–5.1)
SODIUM: 140 mmol/L (ref 135–145)
TOTAL PROTEIN: 6.2 g/dL — AB (ref 6.5–8.1)

## 2015-09-20 LAB — CBC WITH DIFFERENTIAL/PLATELET
BASOS ABS: 0 10*3/uL (ref 0.0–0.1)
BASOS PCT: 0 %
Eosinophils Absolute: 0 10*3/uL (ref 0.0–0.7)
Eosinophils Relative: 0 %
HEMATOCRIT: 34.8 % — AB (ref 39.0–52.0)
Hemoglobin: 11.9 g/dL — ABNORMAL LOW (ref 13.0–17.0)
LYMPHS ABS: 0.6 10*3/uL — AB (ref 0.7–4.0)
Lymphocytes Relative: 6 %
MCH: 33.9 pg (ref 26.0–34.0)
MCHC: 34.2 g/dL (ref 30.0–36.0)
MCV: 99.1 fL (ref 78.0–100.0)
MONOS PCT: 6 %
Monocytes Absolute: 0.6 10*3/uL (ref 0.1–1.0)
NEUTROS ABS: 8.5 10*3/uL — AB (ref 1.7–7.7)
NEUTROS PCT: 88 %
PLATELETS: 140 10*3/uL — AB (ref 150–400)
RBC: 3.51 MIL/uL — ABNORMAL LOW (ref 4.22–5.81)
RDW: 13.8 % (ref 11.5–15.5)
WBC: 9.7 10*3/uL (ref 4.0–10.5)

## 2015-09-20 LAB — LACTIC ACID, PLASMA
Lactic Acid, Venous: 1.4 mmol/L (ref 0.5–1.9)
Lactic Acid, Venous: 1.3 mmol/L (ref 0.5–1.9)

## 2015-09-20 LAB — I-STAT TROPONIN, ED: Troponin i, poc: 0.01 ng/mL (ref 0.00–0.08)

## 2015-09-20 LAB — I-STAT CG4 LACTIC ACID, ED: Lactic Acid, Venous: 1.86 mmol/L (ref 0.5–1.9)

## 2015-09-20 MED ORDER — DEXTROSE-NACL 5-0.45 % IV SOLN
INTRAVENOUS | Status: DC
  Administered 2015-09-20: 17:00:00 via INTRAVENOUS

## 2015-09-20 MED ORDER — PANTOPRAZOLE SODIUM 40 MG PO TBEC
40.0000 mg | DELAYED_RELEASE_TABLET | Freq: Every day | ORAL | Status: DC
  Administered 2015-09-21 – 2015-09-26 (×6): 40 mg via ORAL
  Filled 2015-09-20 (×6): qty 1

## 2015-09-20 MED ORDER — LINACLOTIDE 290 MCG PO CAPS
290.0000 ug | ORAL_CAPSULE | ORAL | Status: DC
  Administered 2015-09-21 – 2015-09-23 (×3): 290 ug via ORAL
  Filled 2015-09-20 (×6): qty 1

## 2015-09-20 MED ORDER — PNEUMOCOCCAL VAC POLYVALENT 25 MCG/0.5ML IJ INJ
0.5000 mL | INJECTION | INTRAMUSCULAR | Status: AC
  Administered 2015-09-21: 0.5 mL via INTRAMUSCULAR
  Filled 2015-09-20 (×2): qty 0.5

## 2015-09-20 MED ORDER — ARIPIPRAZOLE 15 MG PO TABS
15.0000 mg | ORAL_TABLET | Freq: Every day | ORAL | Status: DC
  Administered 2015-09-21 – 2015-09-26 (×6): 15 mg via ORAL
  Filled 2015-09-20 (×6): qty 1

## 2015-09-20 MED ORDER — HALOPERIDOL 1 MG PO TABS
0.5000 mg | ORAL_TABLET | Freq: Four times a day (QID) | ORAL | Status: DC | PRN
  Administered 2015-09-22 – 2015-09-24 (×2): 0.5 mg via ORAL
  Filled 2015-09-20 (×4): qty 0.5

## 2015-09-20 MED ORDER — VITAMIN B-12 1000 MCG PO TABS
1000.0000 ug | ORAL_TABLET | Freq: Every day | ORAL | Status: DC
  Administered 2015-09-21 – 2015-09-26 (×6): 1000 ug via ORAL
  Filled 2015-09-20 (×6): qty 1

## 2015-09-20 MED ORDER — SODIUM CHLORIDE 0.9 % IV SOLN: INTRAVENOUS | Status: DC

## 2015-09-20 MED ORDER — TAMOXIFEN CITRATE 10 MG PO TABS
20.0000 mg | ORAL_TABLET | Freq: Every day | ORAL | Status: DC
  Administered 2015-09-21 – 2015-09-26 (×6): 20 mg via ORAL
  Filled 2015-09-20 (×11): qty 2

## 2015-09-20 MED ORDER — SENNOSIDES-DOCUSATE SODIUM 8.6-50 MG PO TABS
2.0000 | ORAL_TABLET | Freq: Every day | ORAL | Status: DC
  Administered 2015-09-21 – 2015-09-25 (×5): 2 via ORAL
  Filled 2015-09-20 (×5): qty 2

## 2015-09-20 MED ORDER — VANCOMYCIN HCL IN DEXTROSE 1-5 GM/200ML-% IV SOLN
1000.0000 mg | INTRAVENOUS | Status: DC
  Administered 2015-09-21: 1000 mg via INTRAVENOUS
  Filled 2015-09-20: qty 200

## 2015-09-20 MED ORDER — ENOXAPARIN SODIUM 40 MG/0.4ML ~~LOC~~ SOLN
40.0000 mg | SUBCUTANEOUS | Status: DC
  Administered 2015-09-20 – 2015-09-25 (×6): 40 mg via SUBCUTANEOUS
  Filled 2015-09-20 (×6): qty 0.4

## 2015-09-20 MED ORDER — LEVOTHYROXINE SODIUM 88 MCG PO TABS
88.0000 ug | ORAL_TABLET | Freq: Every day | ORAL | Status: DC
  Administered 2015-09-21 – 2015-09-26 (×6): 88 ug via ORAL
  Filled 2015-09-20 (×6): qty 1

## 2015-09-20 MED ORDER — GABAPENTIN 300 MG PO CAPS
600.0000 mg | ORAL_CAPSULE | Freq: Three times a day (TID) | ORAL | Status: DC
  Administered 2015-09-21 – 2015-09-26 (×16): 600 mg via ORAL
  Filled 2015-09-20 (×16): qty 2

## 2015-09-20 MED ORDER — PIPERACILLIN-TAZOBACTAM 3.375 G IVPB 30 MIN
3.3750 g | Freq: Once | INTRAVENOUS | Status: AC
  Administered 2015-09-20: 3.375 g via INTRAVENOUS
  Filled 2015-09-20: qty 50

## 2015-09-20 MED ORDER — SODIUM CHLORIDE 0.9 % IV BOLUS (SEPSIS)
1000.0000 mL | Freq: Once | INTRAVENOUS | Status: AC
  Administered 2015-09-20: 1000 mL via INTRAVENOUS

## 2015-09-20 MED ORDER — TAMSULOSIN HCL 0.4 MG PO CAPS
0.4000 mg | ORAL_CAPSULE | Freq: Every day | ORAL | Status: DC
  Administered 2015-09-21 – 2015-09-25 (×5): 0.4 mg via ORAL
  Filled 2015-09-20 (×5): qty 1

## 2015-09-20 MED ORDER — LORAZEPAM 2 MG/ML IJ SOLN
1.0000 mg | Freq: Once | INTRAMUSCULAR | Status: AC
  Administered 2015-09-20: 1 mg via INTRAVENOUS
  Filled 2015-09-20: qty 1

## 2015-09-20 MED ORDER — RIVASTIGMINE 13.3 MG/24HR TD PT24
1.0000 | MEDICATED_PATCH | Freq: Every morning | TRANSDERMAL | Status: DC
  Filled 2015-09-20: qty 1

## 2015-09-20 MED ORDER — ACETAMINOPHEN 650 MG RE SUPP
650.0000 mg | Freq: Once | RECTAL | Status: AC
  Administered 2015-09-20: 650 mg via RECTAL
  Filled 2015-09-20: qty 1

## 2015-09-20 MED ORDER — HALOPERIDOL LACTATE 5 MG/ML IJ SOLN
1.0000 mg | Freq: Four times a day (QID) | INTRAMUSCULAR | Status: DC | PRN
  Administered 2015-09-21: 1 mg via INTRAMUSCULAR
  Filled 2015-09-20: qty 1

## 2015-09-20 MED ORDER — FINASTERIDE 5 MG PO TABS
5.0000 mg | ORAL_TABLET | Freq: Every day | ORAL | Status: DC
  Administered 2015-09-21 – 2015-09-25 (×5): 5 mg via ORAL
  Filled 2015-09-20 (×5): qty 1

## 2015-09-20 MED ORDER — VANCOMYCIN HCL IN DEXTROSE 1-5 GM/200ML-% IV SOLN
1000.0000 mg | Freq: Once | INTRAVENOUS | Status: AC
  Administered 2015-09-20: 1000 mg via INTRAVENOUS
  Filled 2015-09-20: qty 200

## 2015-09-20 MED ORDER — POLYETHYLENE GLYCOL 3350 17 G PO PACK
17.0000 g | PACK | Freq: Every day | ORAL | Status: DC
  Administered 2015-09-21 – 2015-09-25 (×4): 17 g via ORAL
  Filled 2015-09-20 (×5): qty 1

## 2015-09-20 MED ORDER — PIPERACILLIN-TAZOBACTAM 3.375 G IVPB
3.3750 g | Freq: Three times a day (TID) | INTRAVENOUS | Status: DC
  Administered 2015-09-21 – 2015-09-23 (×8): 3.375 g via INTRAVENOUS
  Filled 2015-09-20 (×8): qty 50

## 2015-09-20 NOTE — ED Notes (Signed)
Pt noted to have pulled out IV, gown was ripped off along with monitor leads on arrival back to room to administer IV antibiotics and IV ativan.   Help had been called in order to keep patient from hitting/punching while IM ativan given d/t IV having been pulled out.

## 2015-09-20 NOTE — ED Notes (Signed)
Pt from [facility] at [address]  Per EMS report: Staff reported that pt is less active than normal.  Follows commands and is Ax[place] to person and time, which is his baseline.  Normally ambulates with walker but prior to EMS coming pt became less active, rhonchi, and low grade fever.

## 2015-09-20 NOTE — ED Notes (Signed)
Pt suddenly began to yell "get out of here, I'm going to press charges if you don't get out of here"  Noted to not be oriented anymore as he was when he arrived.  Reported to EDP.

## 2015-09-20 NOTE — ED Notes (Signed)
Bed: [id number] Expected date:  Expected time:  Means of arrival:  Comments: EMS - AMS

## 2015-09-20 NOTE — ED Notes (Addendum)
Admitting MD walked in a few minutes after ativan given.  Pt combative and talking inappropriately to MD and not allowing assessment to be done.  Pt intermittently singing songs about the devil and Jesus.  Admitting MD aware of no IV access and need for ativan to "kick in" in order to restart IV and administer antibiotics.  Will continue to monitor

## 2015-09-20 NOTE — ED Provider Notes (Signed)
CSN: [id number]     Arrival date & time 09/20/15  1425 History   First MD Initiated Contact with Patient 09/20/15 1433     Chief Complaint  Patient presents with  . Altered Mental Status     (Consider location/radiation/quality/duration/timing/severity/associated sxs/prior Treatment) The history is provided by the patient, the EMS personnel and the nursing home.  LYLE WEYER is a 82 y.o. male hx of dementia, recent mastectomy for breast cancer on tamoxifen here with AMS, fever, cough. Patient is from [facility]. He was noted to be more altered than usual. At baseline, he is oriented to person and time and does ambulate. He is less active than usual and altered. Was also noted to have some cough and rhonchi and temp 99. EMS also noticed that his oxygen was 88% on RA. Not normally on oxygen.    Past Medical History  Diagnosis Date  . Dementia   . Hypertension   . Arthritis   . Hypothyroidism   . Chronic back pain   . Anxiety disorder   . Gastroesophageal reflux disease   . Polypharmacy     History of hospitalizations for altered mental status related to medications  . Chronic constipation     with fecal impactions, recurrent  . Suicidal ideation 06/06/2011  . Traumatic subdural hematoma ([place]) 10/01/2013  . Multiple facial fractures ([place]) 10/02/2013  . Basilar skull fracture ([place]) 10/02/2013  . Acute blood loss anemia 10/02/2013  . Neuropathy ([place])   . Depression   . Mood disorder ([place])   . BPH (benign prostatic hyperplasia)   . Tremor   . Osteoarthritis   . Anxiety   . UTI (lower urinary tract infection)   . Cancer of left breast [facility])    Past Surgical History  Procedure Laterality Date  . Appendectomy    . Multiple tooth extractions      Removal of Nonrestorable teeth numbers 2, 4, 6, 12, 13, 18, 29  . Skin surgery      Cancer cells removed  . Prostate surgery      removed x2  . Eye surgery    . Mastectomy  08/20/2015  . Total mastectomy Left 08/20/2015     Procedure: LEFT TOTAL MASTECTOMY;  Surgeon: Fanny Skates, MD;  Location: [place];  Service: General;  Laterality: Left;   Family History  Problem Relation Age of Onset  . Heart disease Father   . Cancer Maternal Grandmother     dx. 90s - NOS type  . Breast cancer Paternal Grandmother     dx later in life  . Throat cancer Son 10    pt's wife said he is NOT aware of this!   Social History  Substance Use Topics  . Smoking status: Never Smoker   . Smokeless tobacco: Never Used  . Alcohol Use: No     Comment: Pt denies    Review of Systems  Unable to perform ROS: Mental status change  Respiratory: Positive for cough and shortness of breath.   All other systems reviewed and are negative.     Allergies  Cogentin  Home Medications   Prior to Admission medications   Medication Sig Start Date End Date Taking? Authorizing Provider  ARIPiprazole (ABILIFY) 15 MG tablet Take 15 mg by mouth daily.   Yes Historical Provider, MD  cyanocobalamin 1000 MCG tablet Take 1,000 mcg by mouth daily.    Yes Historical Provider, MD  finasteride (PROSCAR) 5 MG tablet Take 5 mg by mouth  daily.   Yes Historical Provider, MD  gabapentin (NEURONTIN) 600 MG tablet Take 600 mg by mouth 3 (three) times daily.    Yes Historical Provider, MD  levothyroxine (SYNTHROID, LEVOTHROID) 88 MCG tablet Take 88 mcg by mouth daily before breakfast.    Yes Historical Provider, MD  Linaclotide (LINZESS) 290 MCG CAPS capsule Take 290 mcg by mouth every morning.    Yes Historical Provider, MD  methadone (DOLOPHINE) 5 MG tablet Take one tablet by mouth every 12 hours for pain 08/19/15  Yes Estill Dooms, MD  mirtazapine (REMERON) 15 MG tablet Take 15 mg by mouth at bedtime.   Yes Historical Provider, MD  Multiple Vitamins-Minerals (MULTIVITAMINS THER. W/MINERALS) TABS Take 1 tablet by mouth daily.   Yes Historical Provider, MD  omeprazole (PRILOSEC) 40 MG capsule Take 40 mg by mouth daily.   Yes Historical Provider, MD   polyethylene glycol (MIRALAX / GLYCOLAX) packet Take 17 g by mouth daily.   Yes Historical Provider, MD  Rivastigmine 13.3 MG/24HR PT24 Place 1 patch onto the skin every morning.   Yes Historical Provider, MD  sennosides-docusate sodium (SENOKOT-S) 8.6-50 MG tablet Take 2 tablets by mouth at bedtime.   Yes Historical Provider, MD  tamoxifen (NOLVADEX) 20 MG tablet Take 20 mg by mouth daily.   Yes Historical Provider, MD  tamsulosin (FLOMAX) 0.4 MG CAPS Take 0.4 mg by mouth at bedtime.    Yes Historical Provider, MD   BP 124/56 mmHg  Pulse 88  Temp(Src) 100.9 F (38.3 C) (Rectal)  Resp 18  SpO2 94% Physical Exam  Constitutional:  Altered, tired.   HENT:  Head: Normocephalic.  Eyes: Conjunctivae are normal. Pupils are equal, round, and reactive to light.  Neck: Normal range of motion. Neck supple.  Cardiovascular: Normal rate, regular rhythm and normal heart sounds.   Pulmonary/Chest:  + rhonchi throughout, crackles bilateral bases   Abdominal: Soft. Bowel sounds are normal. He exhibits no distension. There is no tenderness. There is no rebound.  Musculoskeletal: Normal range of motion. He exhibits no edema or tenderness.  Neurological: He is alert.  Demented, moving all extremities   Skin: Skin is warm and dry.  Psychiatric:  Unable   Nursing note and vitals reviewed.   ED Course  Procedures (including critical care time) Labs Review Labs Reviewed  CBC WITH DIFFERENTIAL/PLATELET - Abnormal; Notable for the following:    RBC 3.51 (*)    Hemoglobin 11.9 (*)    HCT 34.8 (*)    Platelets 140 (*)    Neutro Abs 8.5 (*)    Lymphs Abs 0.6 (*)    All other components within normal limits  COMPREHENSIVE METABOLIC PANEL - Abnormal; Notable for the following:    Glucose, Bld 146 (*)    BUN 32 (*)    Creatinine, Ser 1.57 (*)    Calcium 8.3 (*)    Total Protein 6.2 (*)    GFR calc non Af Amer 37 (*)    GFR calc Af Amer 43 (*)    All other components within normal limits  URINE  CULTURE  CULTURE, BLOOD (ROUTINE X 2)  CULTURE, BLOOD (ROUTINE X 2)  URINALYSIS, ROUTINE W REFLEX MICROSCOPIC (NOT AT [facility])  I-STAT CG4 LACTIC ACID, ED  Randolm Idol, ED    Imaging Review Dg Chest 2 View  09/20/2015  CLINICAL DATA:  Fever and cough EXAM: CHEST  2 VIEW COMPARISON:  08/20/2015 FINDINGS: Cardiac shadow is within normal limits. Increased density is noted in the  right lung base projecting in the right lower lobe consistent with acute pneumonia. The left lung is clear. No bony abnormality is noted. IMPRESSION: Right lower lobe pneumonia. Followup PA and lateral chest X-ray is recommended in 3-4 weeks following trial of antibiotic therapy to ensure resolution and exclude underlying malignancy. Electronically Signed   By: Inez Catalina M.D.   On: 09/20/2015 15:18   I have personally reviewed and evaluated these images and lab results as part of my medical decision-making.   EKG Interpretation   Date/Time:  Saturday September 20 2015 14:52:29 EDT Ventricular Rate:  94 PR Interval:    QRS Duration: 93 QT Interval:  346 QTC Calculation: 433 R Axis:   58 Text Interpretation:  Sinus rhythm No significant change since last  tracing Confirmed by YAO  MD, DAVID ([id number]) on 09/20/2015 2:54:32 PM Also  confirmed by Darl Householder  MD, [place] ([id number]), editor Gilford Rile, [place], [place] ([id number])   on 09/20/2015 3:35:17 PM      MDM   Final diagnoses:  None   MICAHEL SELMER is a 82 y.o. male here with AMS. Low grade temp 99. Has rhonchi throughout and hypoxic 88% on RA and not on oxygen at baseline. Not hypotensive or tachycardic. Will do sepsis workup. Hold off on code sepsis for now.   3:41 PM WBC nl. Cr. 1.6, baseline 1.0. CXR showed RLL pneumonia. Recent admission for breast cancer removal so will treat for HCAP. Febrile 100.9 F rectally. Since he requires oxygen and is altered, will order vanc/zosyn and will admit.     Wandra Arthurs, MD 09/20/15 [phone]

## 2015-09-20 NOTE — H&P (Signed)
History and Physical  George Foster [id number] DOB: 10-Dec-1924 DOA: 09/20/2015  PCP:  [name], MD   Chief Complaint:  AMS  History of Present Illness:  Patient is a 82 yo male with hx of breast cancer s/p mastectomy on tamoxifen who was brought here with cc ofAMS, fever and cough. He is very confused and not a good historian.Hisotry was taken from ER. Patient mental status has gotten worse over the past couple of days. He also was noted to have a cough and a fever. He was found hypoxic down to 88% on RA.  Review of Systems:  Unable take due to mental status.  Past Medical and Surgical History:   Past Medical History  Diagnosis Date  . Dementia   . Hypertension   . Arthritis   . Hypothyroidism   . Chronic back pain   . Anxiety disorder   . Gastroesophageal reflux disease   . Polypharmacy     History of hospitalizations for altered mental status related to medications  . Chronic constipation     with fecal impactions, recurrent  . Suicidal ideation 06/06/2011  . Traumatic subdural hematoma ([place]) 10/01/2013  . Multiple facial fractures ([place]) 10/02/2013  . Basilar skull fracture ([place]) 10/02/2013  . Acute blood loss anemia 10/02/2013  . Neuropathy ([place])   . Depression   . Mood disorder ([place])   . BPH (benign prostatic hyperplasia)   . Tremor   . Osteoarthritis   . Anxiety   . UTI (lower urinary tract infection)   . Cancer of left breast [facility])    Past Surgical History  Procedure Laterality Date  . Appendectomy    . Multiple tooth extractions      Removal of Nonrestorable teeth numbers 2, 4, 6, 12, 13, 18, 29  . Skin surgery      Cancer cells removed  . Prostate surgery      removed x2  . Eye surgery    . Mastectomy  08/20/2015  . Total mastectomy Left 08/20/2015    Procedure: LEFT TOTAL MASTECTOMY;  Surgeon: Fanny Skates, MD;  Location: [place];  Service: General;  Laterality: Left;    Social History:   reports that he has never smoked. He has  never used smokeless tobacco. He reports that he does not drink alcohol or use illicit drugs.    Allergies  Allergen Reactions  . Cogentin [Benztropine] Other (See Comments)    unkown    Family History  Problem Relation Age of Onset  . Heart disease Father   . Cancer Maternal Grandmother     dx. 90s - NOS type  . Breast cancer Paternal Grandmother     dx later in life  . Throat cancer Son 7    pt's wife said he is NOT aware of this!      Prior to Admission medications   Medication Sig Start Date End Date Taking? Authorizing Provider  ARIPiprazole (ABILIFY) 15 MG tablet Take 15 mg by mouth daily.   Yes Historical Provider, MD  cyanocobalamin 1000 MCG tablet Take 1,000 mcg by mouth daily.    Yes Historical Provider, MD  finasteride (PROSCAR) 5 MG tablet Take 5 mg by mouth daily.   Yes Historical Provider, MD  gabapentin (NEURONTIN) 600 MG tablet Take 600 mg by mouth 3 (three) times daily.    Yes Historical Provider, MD  levothyroxine (SYNTHROID, LEVOTHROID) 88 MCG tablet Take 88 mcg by mouth daily before breakfast.    Yes Historical Provider, MD  Linaclotide (LINZESS) 290 MCG CAPS capsule Take 290 mcg by mouth every morning.    Yes Historical Provider, MD  methadone (DOLOPHINE) 5 MG tablet Take one tablet by mouth every 12 hours for pain 08/19/15  Yes Estill Dooms, MD  mirtazapine (REMERON) 15 MG tablet Take 15 mg by mouth at bedtime.   Yes Historical Provider, MD  Multiple Vitamins-Minerals (MULTIVITAMINS THER. W/MINERALS) TABS Take 1 tablet by mouth daily.   Yes Historical Provider, MD  omeprazole (PRILOSEC) 40 MG capsule Take 40 mg by mouth daily.   Yes Historical Provider, MD  polyethylene glycol (MIRALAX / GLYCOLAX) packet Take 17 g by mouth daily.   Yes Historical Provider, MD  Rivastigmine 13.3 MG/24HR PT24 Place 1 patch onto the skin every morning.   Yes Historical Provider, MD  sennosides-docusate sodium (SENOKOT-S) 8.6-50 MG tablet Take 2 tablets by mouth at bedtime.    Yes Historical Provider, MD  tamoxifen (NOLVADEX) 20 MG tablet Take 20 mg by mouth daily.   Yes Historical Provider, MD  tamsulosin (FLOMAX) 0.4 MG CAPS Take 0.4 mg by mouth at bedtime.    Yes Historical Provider, MD    Physical Exam: BP 124/56 mmHg  Pulse 88  Temp(Src) 100.9 F (38.3 C) (Rectal)  Resp 18  SpO2 94%  Patient awake, confused, disoriented, delirious, refusing physical exam and pushing nurses/me away, took out his IV.          Labs on Admission:  Reviewed.   Radiological Exams on Admission: Dg Chest 2 View  09/20/2015  CLINICAL DATA:  Fever and cough EXAM: CHEST  2 VIEW COMPARISON:  08/20/2015 FINDINGS: Cardiac shadow is within normal limits. Increased density is noted in the right lung base projecting in the right lower lobe consistent with acute pneumonia. The left lung is clear. No bony abnormality is noted. IMPRESSION: Right lower lobe pneumonia. Followup PA and lateral chest X-ray is recommended in 3-4 weeks following trial of antibiotic therapy to ensure resolution and exclude underlying malignancy. Electronically Signed   By: Inez Catalina M.D.   On: 09/20/2015 15:18     Assessment/Plan  Altered mental status: Baseline: Dementia  Ddx:   Neurologic:Structural (mass)/ bleeding: head CT scan pending Meds/Toxins:Iatrogenic:  Polypharmacy: holding physicogenic and pain meds. Infectious: likely due to PNA Metabolic  Less likely   HCAP: Started on Vanc/Zosyn  Hx of breast cancer: s/p mastectomy, on chemo.  Input & Output: NA Lines & Tubes: PIV DVT prophylaxis: [place] enoxaparin  GI prophylaxis: NA Consultants: NA Code Status: DNR/DNI Family Communication: None at bedside  Disposition Plan: Admit    Gennaro Africa M.D [facility]

## 2015-09-20 NOTE — Progress Notes (Signed)
Pharmacy Antibiotic Note  George Foster is a 82 y.o. male admitted on 09/20/2015 with pneumonia.  Pharmacy has been consulted for Vanc/Zosyn dosing.  Plan: Vancomycin 1g IV every 24 hours.  Goal trough 15-20 mcg/mL. Zosyn 3.375g IV q8h (4 hour infusion).     Temp (24hrs), Avg:100.1 F (37.8 C), Min:99.3 F (37.4 C), Max:100.9 F (38.3 C)   Recent Labs Lab 09/20/15 1451 09/20/15 1509  WBC 9.7  --   CREATININE 1.57*  --   LATICACIDVEN  --  1.86    CrCl cannot be calculated (Unknown ideal weight.).    Allergies  Allergen Reactions  . Cogentin [Benztropine] Other (See Comments)    unkown    Thank you for allowing pharmacy to be a part of this patient's care.  Adrian Saran, PharmD, BCPS Pager [phone] 09/20/2015 4:24 PM

## 2015-09-21 DIAGNOSIS — J690 Pneumonitis due to inhalation of food and vomit: Secondary | ICD-10-CM | POA: Diagnosis not present

## 2015-09-21 LAB — URINALYSIS, ROUTINE W REFLEX MICROSCOPIC
BILIRUBIN URINE: NEGATIVE
Glucose, UA: NEGATIVE mg/dL
Hgb urine dipstick: NEGATIVE
KETONES UR: NEGATIVE mg/dL
Leukocytes, UA: NEGATIVE
NITRITE: NEGATIVE
Protein, ur: NEGATIVE mg/dL
SPECIFIC GRAVITY, URINE: 1.019 (ref 1.005–1.030)
pH: 5 (ref 5.0–8.0)

## 2015-09-21 LAB — CBC
HEMATOCRIT: 34.2 % — AB (ref 39.0–52.0)
HEMOGLOBIN: 11.5 g/dL — AB (ref 13.0–17.0)
MCH: 34.4 pg — ABNORMAL HIGH (ref 26.0–34.0)
MCHC: 33.6 g/dL (ref 30.0–36.0)
MCV: 102.4 fL — ABNORMAL HIGH (ref 78.0–100.0)
Platelets: 138 10*3/uL — ABNORMAL LOW (ref 150–400)
RBC: 3.34 MIL/uL — AB (ref 4.22–5.81)
RDW: 14 % (ref 11.5–15.5)
WBC: 7 10*3/uL (ref 4.0–10.5)

## 2015-09-21 LAB — COMPREHENSIVE METABOLIC PANEL
ALT: 17 U/L (ref 17–63)
ANION GAP: 7 (ref 5–15)
AST: 46 U/L — ABNORMAL HIGH (ref 15–41)
Albumin: 3.1 g/dL — ABNORMAL LOW (ref 3.5–5.0)
Alkaline Phosphatase: 43 U/L (ref 38–126)
BILIRUBIN TOTAL: 0.8 mg/dL (ref 0.3–1.2)
BUN: 29 mg/dL — ABNORMAL HIGH (ref 6–20)
CHLORIDE: 113 mmol/L — AB (ref 101–111)
CO2: 22 mmol/L (ref 22–32)
Calcium: 8.1 mg/dL — ABNORMAL LOW (ref 8.9–10.3)
Creatinine, Ser: 1.23 mg/dL (ref 0.61–1.24)
GFR calc Af Amer: 58 mL/min — ABNORMAL LOW (ref >60)
GFR, EST NON AFRICAN AMERICAN: 50 mL/min — AB (ref >60)
Glucose, Bld: 100 mg/dL — ABNORMAL HIGH (ref 65–99)
POTASSIUM: 4.1 mmol/L (ref 3.5–5.1)
Sodium: 142 mmol/L (ref 135–145)
TOTAL PROTEIN: 5.8 g/dL — AB (ref 6.5–8.1)

## 2015-09-21 LAB — HEPATIC FUNCTION PANEL: BILIRUBIN, TOTAL: 0.8 mg/dL

## 2015-09-21 MED ORDER — METHADONE HCL 5 MG PO TABS
5.0000 mg | ORAL_TABLET | Freq: Two times a day (BID) | ORAL | Status: DC
  Administered 2015-09-21 – 2015-09-26 (×11): 5 mg via ORAL
  Filled 2015-09-21 (×11): qty 1

## 2015-09-21 MED ORDER — DEXTROSE-NACL 5-0.45 % IV SOLN: INTRAVENOUS | Status: DC

## 2015-09-21 MED ORDER — CETYLPYRIDINIUM CHLORIDE 0.05 % MT LIQD
7.0000 mL | Freq: Two times a day (BID) | OROMUCOSAL | Status: DC
  Administered 2015-09-21 – 2015-09-26 (×10): 7 mL via OROMUCOSAL

## 2015-09-21 MED ORDER — ALBUTEROL SULFATE (2.5 MG/3ML) 0.083% IN NEBU: 2.5000 mg | INHALATION_SOLUTION | RESPIRATORY_TRACT | Status: DC | PRN

## 2015-09-21 NOTE — Evaluation (Signed)
Physical Therapy Evaluation Patient Details Name: George Foster MRN: [id number] DOB: Jul 28, 1924 Today's Date: 09/21/2015   History of Present Illness  Patient is a 82 yo male   who was brought here with cc of AMS, fever and cough. He is very confused and not a good historian. He was found hypoxic down to 88% on RA on admission PMHx: breast cancer s/p mastectomy on tamoxifen, dementia, SDH, SI  Clinical Impression  Pt admitted with above diagnosis. Pt currently with functional limitations due to the deficits listed below (see PT Problem List).  Pt will benefit from skilled PT to increase their independence and safety with mobility to allow discharge to the venue listed below.       Follow Up Recommendations SNF;Supervision/Assistance - 24 hour    Equipment Recommendations  None recommended by PT    Recommendations for Other Services       Precautions / Restrictions Precautions Precautions: Fall Restrictions Weight Bearing Restrictions: No      Mobility  Bed Mobility               General bed mobility comments: NT--pt in chair- per RN pt to remain in chair   Transfers Overall transfer level: Needs assistance Equipment used: 2 person hand held assist Transfers: Sit to/from Stand Sit to Stand: Mod assist;+2 physical assistance;+2 safety/equipment         General transfer comment: assist to rise and stabilize, pt with posterior bias, flexed trunk and bracing LEs on chair to remain standing, improved posture with multi-modal cues  Ambulation/Gait             General Gait Details: NT  Stairs            Wheelchair Mobility    Modified Rankin (Stroke Patients Only)       Balance Overall balance assessment: Needs assistance   Sitting balance-Leahy Scale: Fair       Standing balance-Leahy Scale: Zero                               Pertinent Vitals/Pain Pain Assessment: No/denies pain    Home Living Family/patient expects to be  discharged to:: Skilled nursing facility                      Prior Function           Comments: pt reports he is non-amb at baseline, transfers only; however unsure of reliability as pt is not a good historian     Hand Dominance        Extremity/Trunk Assessment   Upper Extremity Assessment: Defer to OT evaluation;Generalized weakness           Lower Extremity Assessment: Generalized weakness         Communication      Cognition Arousal/Alertness: Awake/alert (sleepy) Behavior During Therapy: WFL for tasks assessed/performed Overall Cognitive Status: History of cognitive impairments - at baseline                      General Comments      Exercises        Assessment/Plan    PT Assessment Patient needs continued PT services  PT Diagnosis Generalized weakness   PT Problem List Decreased strength;Decreased range of motion;Decreased balance;Decreased mobility;Decreased activity tolerance  PT Treatment Interventions DME instruction;Functional mobility training;Therapeutic activities;Therapeutic exercise;Balance training;Patient/family education   PT Goals (Current goals can be  found in the Care Plan section) Acute Rehab PT Goals Patient Stated Goal: none stated PT Goal Formulation: Patient unable to participate in goal setting Time For Goal Achievement: 10/05/15 Potential to Achieve Goals: Good    Frequency Min 3X/week   Barriers to discharge        Co-evaluation               End of Session Equipment Utilized During Treatment: Gait belt Activity Tolerance: Patient limited by fatigue Patient left: in chair;with call bell/phone within reach;with chair alarm set           Time: [id number] PT Time Calculation (min) (ACUTE ONLY): 12 min   Charges:   PT Evaluation $PT Eval Low Complexity: 1 Procedure     PT G Codes:        George Foster October 04, 2015, 1:29 PM

## 2015-09-21 NOTE — Clinical Social Work Note (Signed)
Clinical Social Work Assessment  Patient Details  Name: George Foster MRN: [id number] Date of Birth: April 05, 1924  Date of referral:  09/21/15               Reason for consult:  Facility Placement                Permission sought to share information with:  Family Supports Permission granted to share information::  Yes, Verbal Permission Granted  Name::     C[place]::  [facility]  Relationship::  Wife  Contact Information:  [phone]  Housing/Transportation Living arrangements for the past 2 months:  [place] Personal assistant) Source of Information:  Spouse Patient Interpreter Needed:  None Criminal Activity/Legal Involvement Pertinent to Current Situation/Hospitalization:  No - Comment as needed Significant Relationships:  Spouse Lives with:  Facility Resident Do you feel safe going back to the place where you live?  Yes Need for family participation in patient care:  Yes (Comment) (Patient's wife active in patient's care.)  Care giving concerns:  Patient's wife expressed no concerns at this time.   Social Worker assessment / plan:  LCSW received referral for possible SNF placement at time of discharge. LCSW spoke with patient's wife who informed LCSW that patient is a resident at [facility]. Per patient's wife, patient's family agreeable to patient returning to [place] once medically stable for discharge. LCSW to continue to follow and assist with discharge planning needs.  Employment status:  Retired Forensic scientist:  Medicare PT Recommendations:  [place] / Referral to community resources:  [place]  Patient/Family's Response to care:  Patient's wife understanding and agreeable to [employer] of care.  Patient/Family's Understanding of and Emotional Response to Diagnosis, Current Treatment, and Prognosis:  Patient's wife understanding and agreeable to LCSW plan of  care.  Emotional Assessment Appearance:   (LCSW spoke with patient's wife.) Attitude/Demeanor/Rapport:   (LCSW spoke with patient's wife.) Affect (typically observed):   (LCSW spoke with patient's wife.) Orientation:   (Disoriented x4) Alcohol / Substance use:  Not Applicable Psych involvement (Current and /or in the community):  No (Comment) (Not appropriate on this admission.)  Discharge Needs  Concerns to be addressed:  No discharge needs identified Readmission within the last 30 days:  No Current discharge risk:  None Barriers to Discharge:  No Barriers Identified   Caroline Sauger, LCSW 09/21/2015, 2:56 PM

## 2015-09-21 NOTE — Progress Notes (Signed)
PROGRESS NOTE                                                                                                                                                                                                             Patient Demographics:    George Foster, is a 82 y.o. male, DOB - 1925/01/20, PVX:[id number]  Admit date - 09/20/2015   Admitting Physician Gennaro Africa, MD  Outpatient Primary MD for the patient is George Duos, MD  LOS - 1  Chief Complaint  Patient presents with  . Altered Mental Status       Brief Narrative    Patient is a 82 yo male with hx of breast cancer s/p mastectomy on tamoxifen who was brought here with cc ofAMS, fever and cough. He is very confused and not a good historian.Hisotry was taken from ER. Patient mental status has gotten worse over the past couple of days. He also was noted to have a cough and a fever. He was found hypoxic down to 88% on RA.   Subjective:    George Foster today has, No headache, No chest pain, No abdominal pain - No Nausea, No new weakness tingling or numbness, Improved Cough & SOB.     Assessment  & Plan :     1. Acute hypoxic Resp failure due to Pneumonia Rule out Aspiration - Continue vancomycin and Zosyn, for now have downgraded his diet to soft diet, speech to evaluate, continue aspiration precautions and feeding assistance, encourage sitting up in the chair, flutter valve added, continue supportive care with oxygen and nebulizer treatments. 2 dehydration with gentle IV fluids.   2. H/O Left side Breast CA in a male - lobular breast cancer, accompanied by his wife George Foster. Since his last visit here he underwent left simple mastectomy on 08/20/2015. The final pathology ([facility] [id number]) confirmed a 2.7 cm invasive lobular carcinoma, grade 3, which was estrogen receptor 90% positive with moderate staining intensity, progesterone receptor 90% positive with strong  staining intensity, and HER-2 nonamplified, with a signals ratio of 1.50, the number per cell being 2.25. Following Dr. Levonne Spiller oncologist and currently on tamoxifen treatment.  3. Toxic encephalopathy on top of dementia. Due to #1 above. Mentation has improved, exam is nonfocal and CT head unremarkable. Continue supportive care, minimize narcotics and benzodiazepines, will remain at risk  for delirium.  4. BPH. On Proscar and Flomax.  5. Hypothyroidism. On Synthroid continue.  6. Depression. On Abilify along with Remeron. Hold rivastigmine due to encephalopathy.  7. Chronic pain. On methadone. Started at less than half home dose to avoid any abrupt withdrawal.    Family Communication  :  None rpesent  Code Status :  DNR  Diet : Soft with speech to see  Disposition Plan  :  Stay inpt  Consults  :    Procedures  :    CT Head Non acute  DVT Prophylaxis  :  Lovenox    Lab Results  Component Value Date   PLT 138* 09/21/2015    Inpatient Medications  Scheduled Meds: . ARIPiprazole  15 mg Oral Daily  . enoxaparin (LOVENOX) injection  40 mg Subcutaneous Q24H  . finasteride  5 mg Oral QHS  . gabapentin  600 mg Oral TID  . levothyroxine  88 mcg Oral QAC breakfast  . linaclotide  290 mcg Oral BH-q7a  . pantoprazole  40 mg Oral QAC breakfast  . piperacillin-tazobactam (ZOSYN)  IV  3.375 g Intravenous Q8H  . pneumococcal 23 valent vaccine  0.5 mL Intramuscular Tomorrow-1000  . polyethylene glycol  17 g Oral QHS  . Rivastigmine  1 patch Transdermal q morning - 10a  . senna-docusate  2 tablet Oral QHS  . tamoxifen  20 mg Oral Daily  . tamsulosin  0.4 mg Oral QHS  . vancomycin  1,000 mg Intravenous Q24H  . cyanocobalamin  1,000 mcg Oral Daily   Continuous Infusions: . dextrose 5 % and 0.45% NaCl 50 mL/hr at 09/21/15 0900   PRN Meds:.albuterol, haloperidol **OR** haloperidol lactate  Antibiotics  :    Anti-infectives    Start     Dose/Rate Route Frequency Ordered Stop    09/21/15 1700  vancomycin (VANCOCIN) IVPB 1000 mg/200 mL premix     1,000 mg 200 mL/hr over 60 Minutes Intravenous Every 24 hours 09/20/15 1626     09/21/15 0200  piperacillin-tazobactam (ZOSYN) IVPB 3.375 g     3.375 g 12.5 mL/hr over 240 Minutes Intravenous Every 8 hours 09/20/15 1626     09/20/15 1545  vancomycin (VANCOCIN) IVPB 1000 mg/200 mL premix     1,000 mg 200 mL/hr over 60 Minutes Intravenous  Once 09/20/15 1541 09/20/15 1747   09/20/15 1545  piperacillin-tazobactam (ZOSYN) IVPB 3.375 g     3.375 g 100 mL/hr over 30 Minutes Intravenous  Once 09/20/15 1541 09/20/15 1717         Objective:   Filed Vitals:   09/20/15 1820 09/20/15 1901 09/20/15 2037 09/21/15 0450  BP:  142/71 101/48 107/66  Pulse:  68 63 84  Temp:  98.2 F (36.8 C) 98 F (36.7 C) 98.3 F (36.8 C)  TempSrc:  Oral Axillary Oral  Resp:  _0 Height: _1  (1.727 m)     Weight:      SpO2:   97% 92%    Wt Readings from Last 3 Encounters:  09/20/15 82.3 kg (181 lb 7 oz)  09/01/15 81.647 kg (180 lb)  08/22/15 85.276 kg (188 lb)     Intake/Output Summary (Last 24 hours) at 09/21/15 1040 Last data filed at 09/21/15 0900  Gross per 24 hour  Intake 1197.5 ml  Output      0 ml  Net 1197.5 ml     Physical Exam  Awake Alert, Oriented X 2, No new F.N deficits, Normal affect [place].AT,PERRAL  Supple Neck,No JVD, No cervical lymphadenopathy appriciated.  Symmetrical Chest wall movement, Good air movement bilaterally, RLL rales, L chest wall surgical scar under bandage, no cellulitis around RRR,No Gallops,Rubs or new Murmurs, No Parasternal Heave +ve B.Sounds, Abd Soft, No tenderness, No organomegaly appriciated, No rebound - guarding or rigidity. No Cyanosis, Clubbing or edema, No new Rash or bruise       Data Review:    CBC  Recent Labs Lab 09/20/15 1451 09/21/15 0515  WBC 9.7 7.0  HGB 11.9* 11.5*  HCT 34.8* 34.2*  PLT 140* 138*  MCV 99.1 102.4*  MCH 33.9 34.4*  MCHC 34.2 33.6  RDW  13.8 14.0  LYMPHSABS 0.6*  --   MONOABS 0.6  --   EOSABS 0.0  --   BASOSABS 0.0  --     Chemistries   Recent Labs Lab 09/20/15 1451 09/21/15 0515  NA 140 142  K 4.3 4.1  CL 109 113*  CO2 23 22  GLUCOSE 146* 100*  BUN 32* 29*  CREATININE 1.57* 1.23  CALCIUM 8.3* 8.1*  AST 39 46*  ALT 17 17  ALKPHOS 45 43  BILITOT 1.2 0.8   ------------------------------------------------------------------------------------------------------------------ No results for input(s): CHOL, HDL, LDLCALC, TRIG, CHOLHDL, LDLDIRECT in the last 72 hours.  No results found for: HGBA1C ------------------------------------------------------------------------------------------------------------------ No results for input(s): TSH, T4TOTAL, T3FREE, THYROIDAB in the last 72 hours.  Invalid input(s): FREET3 ------------------------------------------------------------------------------------------------------------------ No results for input(s): VITAMINB12, FOLATE, FERRITIN, TIBC, IRON, RETICCTPCT in the last 72 hours.  Lactic Acid, Venous    Component Value Date/Time   LATICACIDVEN 1.3 09/20/2015 2027     Coagulation profile No results for input(s): INR, PROTIME in the last 168 hours.  No results for input(s): DDIMER in the last 72 hours.  Cardiac Enzymes No results for input(s): CKMB, TROPONINI, MYOGLOBIN in the last 168 hours.  Invalid input(s): CK ------------------------------------------------------------------------------------------------------------------ No results found for: BNP  Micro Results Recent Results (from the past 240 hour(s))  Blood culture (routine x 2)     Status: None (Preliminary result)   Collection Time: 09/20/15  4:35 PM  Result Value Ref Range Status   Specimen Description BLOOD RIGHT ARM  Final   Special Requests BOTTLES DRAWN AEROBIC AND ANAEROBIC 5CC  Final   Culture PENDING  Incomplete   Report Status PENDING  Incomplete    Radiology Reports Dg Chest 2  View  09/20/2015  CLINICAL DATA:  Fever and cough EXAM: CHEST  2 VIEW COMPARISON:  08/20/2015 FINDINGS: Cardiac shadow is within normal limits. Increased density is noted in the right lung base projecting in the right lower lobe consistent with acute pneumonia. The left lung is clear. No bony abnormality is noted. IMPRESSION: Right lower lobe pneumonia. Followup PA and lateral chest X-ray is recommended in 3-4 weeks following trial of antibiotic therapy to ensure resolution and exclude underlying malignancy. Electronically Signed   By: Inez Catalina M.D.   On: 09/20/2015 15:18   Ct Head Wo Contrast  09/20/2015  CLINICAL DATA:  Patient noted to be much less active than normal with low-grade fever. EXAM: CT HEAD WITHOUT CONTRAST TECHNIQUE: Contiguous axial images were obtained from the base of the skull through the vertex without intravenous contrast. COMPARISON:  10/24/2014 FINDINGS: The ventricles and cisterns are within normal. There is minimal prominence of the sulcal spaces compatible with age related atrophic change. There is mild chronic ischemic microvascular disease. There is no mass, mass effect, shift of midline structures or acute hemorrhage. There is no evidence of  acute infarction. Remaining bones and soft tissues are within normal. IMPRESSION: No acute intracranial findings. Chronic ischemic microvascular disease and mild age related atrophic change. Electronically Signed   By: Marin Olp M.D.   On: 09/20/2015 17:19    Time Spent in minutes  30   SINGH,PRASHANT K M.D on 09/21/2015 at 10:40 AM  Between 7am to 7pm - Pager - [phone]  After 7pm go to [email or web address] - password [facility]  [facility] -  Office  [phone]

## 2015-09-21 NOTE — Evaluation (Signed)
Clinical/Bedside Swallow Evaluation Patient Details  Name: George Foster MRN: [id number] Date of Birth: September 12, 1924  Today's Date: 09/21/2015 Time: SLP Start Time (ACUTE ONLY): 1501 SLP Stop Time (ACUTE ONLY): 1509 SLP Time Calculation (min) (ACUTE ONLY): 8 min  Past Medical History:  Past Medical History  Diagnosis Date  . Dementia   . Hypertension   . Arthritis   . Hypothyroidism   . Chronic back pain   . Anxiety disorder   . Gastroesophageal reflux disease   . Polypharmacy     History of hospitalizations for altered mental status related to medications  . Chronic constipation     with fecal impactions, recurrent  . Suicidal ideation 06/06/2011  . Traumatic subdural hematoma ([place]) 10/01/2013  . Multiple facial fractures ([place]) 10/02/2013  . Basilar skull fracture ([place]) 10/02/2013  . Acute blood loss anemia 10/02/2013  . Neuropathy ([place])   . Depression   . Mood disorder ([facility])   . BPH (benign prostatic hyperplasia)   . Tremor   . Osteoarthritis   . Anxiety   . UTI (lower urinary tract infection)   . Cancer of left breast [facility])    Past Surgical History:  Past Surgical History  Procedure Laterality Date  . Appendectomy    . Multiple tooth extractions      Removal of Nonrestorable teeth numbers 2, 4, 6, 12, 13, 18, 29  . Skin surgery      Cancer cells removed  . Prostate surgery      removed x2  . Eye surgery    . Mastectomy  08/20/2015  . Total mastectomy Left 08/20/2015    Procedure: LEFT TOTAL MASTECTOMY;  Surgeon: Fanny Skates, MD;  Location: [place];  Service: General;  Laterality: Left;   HPI:  Patient is a 82 yo male who was brought here with cc of AMS, fever and cough. He is very confused and not a good historian. He was found hypoxic down to 88% on RA on admission PMHx: breast cancer s/p mastectomy on tamoxifen, dementia, SDH, SI   Assessment / Plan / Recommendation Clinical Impression  Pt's oropharyngeal swallow appears to be likely at or near baseline with  mildly prolonged bolus formation that is likely due to lack of dentition. He does however have a mild aspiration risk due to current altered mentation. Therefore would continue with current soft diet but with brief SLP f/u for tolerance.    Aspiration Risk  Mild aspiration risk    Diet Recommendation Dysphagia 3 (Mech soft);Thin liquid   Liquid Administration via: Cup;Straw Medication Administration: Whole meds with liquid Supervision: Patient able to self feed;Full supervision/cueing for compensatory strategies Compensations: Minimize environmental distractions;Slow rate;Small sips/bites Postural Changes: Seated upright at 90 degrees;Remain upright for at least 30 minutes after po intake    Other  Recommendations Oral Care Recommendations: Oral care BID   Follow up Recommendations   (tba)    Frequency and Duration min 2x/week  1 week       Prognosis Prognosis for Safe Diet Advancement: Fair      Swallow Study   General HPI: Patient is a 82 yo male who was brought here with cc of AMS, fever and cough. He is very confused and not a good historian. He was found hypoxic down to 88% on RA on admission PMHx: breast cancer s/p mastectomy on tamoxifen, dementia, SDH, SI Type of Study: Bedside Swallow Evaluation Previous Swallow Assessment: none in chart Diet Prior to this Study: Dysphagia 3 (soft);Thin liquids Temperature Spikes  Noted: Yes (100.9) Respiratory Status: Nasal cannula History of Recent Intubation: No Behavior/Cognition: Cooperative;Lethargic/Drowsy Oral Cavity Assessment: Within Functional Limits Oral Care Completed by SLP: No Oral Cavity - Dentition: Edentulous Self-Feeding Abilities: Able to feed self;Needs set up Patient Positioning: Upright in bed Baseline Vocal Quality: Normal    Oral/Motor/Sensory Function Overall Oral Motor/Sensory Function: Within functional limits   Ice Chips Ice chips: Not tested   Thin Liquid Thin Liquid: Within functional  limits Presentation: Self Fed;Straw    Nectar Thick Nectar Thick Liquid: Not tested   Honey Thick Honey Thick Liquid: Not tested   Puree Puree: Not tested   Solid   GO   Solid: Within functional limits (given lack of dentition)       Germain Osgood, M.A. CCC-SLP [phone]  Germain Osgood 09/21/2015,3:27 PM

## 2015-09-21 NOTE — NC FL2 (Signed)
[place] LEVEL OF CARE SCREENING TOOL     IDENTIFICATION  Patient Name: George Foster Birthdate: 1925/03/09 Sex: male Admission Date (Current Location): 09/20/2015  [facility] and [place] Number:  Herbalist and Address:  [facility],  [place] [address], [place]      Provider Number: [id number]  Attending Physician Name and Address:  Thurnell Lose, MD  Relative Name and Phone Number:       Current Level of Care: Hospital Recommended Level of Care: [place] Prior Approval Number:    Date Approved/Denied:   PASRR Number: [id number]  Discharge Plan: SNF    Current Diagnoses: Patient Active Problem List   Diagnosis Date Noted  . HCAP (healthcare-associated pneumonia) 09/20/2015  . Altered mental state 09/20/2015  . History of melanoma 09/09/2015  . Family history of breast cancer in male 09/09/2015  . Cancer of overlapping sites of left male breast ([place]) 07/09/2015  . Vitamin D deficiency 04/21/2015  . Angioedema 01/09/2015  . Major depressive disorder, recurrent episode ([place]) 12/03/2014  . Essential hypertension, benign 11/19/2014  . Psychosis 11/19/2014  . Mood disorder ([place]) 10/31/2014  . Hereditary and idiopathic peripheral neuropathy 07/01/2014  . Insomnia 04/19/2014  . Fall 10/02/2013  . BPH (benign prostatic hyperplasia) 02/17/2013  . Osteoarthritis cervical spine 02/17/2013  . Chronic constipation   . Chronic back pain 06/08/2011  . Dementia with behavioral disturbance 06/06/2011  . Hypothyroidism   . Gastroesophageal reflux disease   . Anxiety disorder     Orientation RESPIRATION BLADDER Height & Weight      (Disoriented x4)  O2 Incontinent Weight: 181 lb 7 oz (82.3 kg) Height:  5\' 8"  (172.7 cm)  BEHAVIORAL SYMPTOMS/MOOD NEUROLOGICAL BOWEL NUTRITION STATUS      Continent Diet (Please see discharge summary.)  AMBULATORY STATUS COMMUNICATION OF NEEDS Skin   Total Care Verbally PU Stage  and Appropriate Care PU Stage 1 Dressing: No Dressing                     Personal Care Assistance Level of Assistance  Bathing, Feeding, Dressing Bathing Assistance: Maximum assistance Feeding assistance: Maximum assistance Dressing Assistance: Maximum assistance     Functional Limitations Info             SPECIAL CARE FACTORS FREQUENCY  PT (By licensed PT), OT (By licensed OT)     PT Frequency: 5 OT Frequency: 5            Contractures      Additional Factors Info  Code Status, Allergies Code Status Info: Cogentin Allergies Info: DNR           Current Medications (09/21/2015):  This is the current hospital active medication list Current Facility-Administered Medications  Medication Dose Route Frequency Provider Last Rate Last Dose  . albuterol (PROVENTIL) (2.5 MG/3ML) 0.083% nebulizer solution 2.5 mg  2.5 mg Nebulization Q4H PRN Thurnell Lose, MD      . ARIPiprazole (ABILIFY) tablet 15 mg  15 mg Oral Daily Gennaro Africa, MD   15 mg at 09/21/15 1217  . dextrose 5 %-0.45 % sodium chloride infusion   Intravenous Continuous Thurnell Lose, MD 50 mL/hr at 09/21/15 0900    . enoxaparin (LOVENOX) injection 40 mg  40 mg Subcutaneous Q24H Gennaro Africa, MD   40 mg at 09/20/15 2007  . finasteride (PROSCAR) tablet 5 mg  5 mg Oral QHS Gennaro Africa, MD   5 mg at  09/20/15 2247  . gabapentin (NEURONTIN) capsule 600 mg  600 mg Oral TID Gennaro Africa, MD   600 mg at 09/21/15 1217  . haloperidol (HALDOL) tablet 0.5 mg  0.5 mg Oral Q6H PRN Gennaro Africa, MD       Or  . haloperidol lactate (HALDOL) injection 1 mg  1 mg Intramuscular Q6H PRN Gennaro Africa, MD   1 mg at 09/21/15 0131  . levothyroxine (SYNTHROID, LEVOTHROID) tablet 88 mcg  88 mcg Oral QAC breakfast Gennaro Africa, MD   88 mcg at 09/21/15 0744  . linaclotide (LINZESS) capsule 290 mcg  290 mcg Oral Marya Fossa, MD   290 mcg at 09/21/15 [phone]  . methadone (DOLOPHINE) tablet 5 mg  5 mg Oral Q12H Thurnell Lose, MD   5 mg  at 09/21/15 1239  . pantoprazole (PROTONIX) EC tablet 40 mg  40 mg Oral QAC breakfast Gennaro Africa, MD   40 mg at 09/21/15 0744  . piperacillin-tazobactam (ZOSYN) IVPB 3.375 g  3.375 g Intravenous Q8H Adrian Saran, RPH   3.375 g at 09/21/15 1218  . polyethylene glycol (MIRALAX / GLYCOLAX) packet 17 g  17 g Oral QHS Gennaro Africa, MD   17 g at 09/20/15 2248  . senna-docusate (Senokot-S) tablet 2 tablet  2 tablet Oral QHS Gennaro Africa, MD   2 tablet at 09/20/15 2248  . tamoxifen (NOLVADEX) tablet 20 mg  20 mg Oral Daily Gennaro Africa, MD   20 mg at 09/21/15 1218  . tamsulosin (FLOMAX) capsule 0.4 mg  0.4 mg Oral QHS Gennaro Africa, MD   0.4 mg at 09/20/15 2249  . vancomycin (VANCOCIN) IVPB 1000 mg/200 mL premix  1,000 mg Intravenous Q24H Adrian Saran, [facility]      . vitamin B-12 (CYANOCOBALAMIN) tablet 1,000 mcg  1,000 mcg Oral Daily Gennaro Africa, MD   1,000 mcg at 09/21/15 1217     Discharge Medications: Please see discharge summary for a list of discharge medications.  Relevant Imaging Results:  Relevant Lab Results:   Additional Information SSN: [id number]  Caroline Sauger, LCSW

## 2015-09-22 ENCOUNTER — Inpatient Hospital Stay (HOSPITAL_COMMUNITY): Payer: Medicare Other

## 2015-09-22 LAB — CBC
HCT: 27.4 % — ABNORMAL LOW (ref 39.0–52.0)
Hemoglobin: 9.3 g/dL — ABNORMAL LOW (ref 13.0–17.0)
MCH: 34.6 pg — AB (ref 26.0–34.0)
MCHC: 33.9 g/dL (ref 30.0–36.0)
MCV: 101.9 fL — ABNORMAL HIGH (ref 78.0–100.0)
PLATELETS: 119 10*3/uL — AB (ref 150–400)
RBC: 2.69 MIL/uL — AB (ref 4.22–5.81)
RDW: 14.3 % (ref 11.5–15.5)
WBC: 5.3 10*3/uL (ref 4.0–10.5)

## 2015-09-22 LAB — URINE CULTURE: Culture: 4000 — AB

## 2015-09-22 LAB — BASIC METABOLIC PANEL
Anion gap: 6 (ref 5–15)
BUN: 27 mg/dL — AB (ref 6–20)
CO2: 23 mmol/L (ref 22–32)
Calcium: 7.5 mg/dL — ABNORMAL LOW (ref 8.9–10.3)
Chloride: 109 mmol/L (ref 101–111)
Creatinine, Ser: 1.19 mg/dL (ref 0.61–1.24)
GFR calc Af Amer: >60 mL/min (ref >60)
GFR, EST NON AFRICAN AMERICAN: 52 mL/min — AB (ref >60)
Glucose, Bld: 104 mg/dL — ABNORMAL HIGH (ref 65–99)
POTASSIUM: 4.1 mmol/L (ref 3.5–5.1)
SODIUM: 138 mmol/L (ref 135–145)

## 2015-09-22 MED ORDER — RESOURCE THICKENUP CLEAR PO POWD
ORAL | Status: DC | PRN
  Filled 2015-09-22: qty 125

## 2015-09-22 MED ORDER — VANCOMYCIN HCL IN DEXTROSE 750-5 MG/150ML-% IV SOLN
750.0000 mg | Freq: Two times a day (BID) | INTRAVENOUS | Status: DC
  Administered 2015-09-22 – 2015-09-23 (×3): 750 mg via INTRAVENOUS
  Filled 2015-09-22 (×3): qty 150

## 2015-09-22 NOTE — Progress Notes (Signed)
PROGRESS NOTE                                                                                                                                                                                                             Patient Demographics:    George Foster, is a 82 y.o. male, DOB - [birth year], JHE:[id number]  Admit date - 09/20/2015   Admitting Physician Gennaro Africa, MD  Outpatient Primary MD for the patient is [name], MD  LOS - 2  Chief Complaint  Patient presents with  . Altered Mental Status       Brief Narrative    Patient is a 82 yo male with hx of breast cancer s/p mastectomy on tamoxifen who was brought here with cc ofAMS, fever and cough. He is very confused and not a good historian.Hisotry was taken from ER. Patient mental status has gotten worse over the past couple of days. He also was noted to have a cough and a fever. He was found hypoxic down to 88% on RA.   Subjective:    George Foster today has, No headache, No chest pain, No abdominal pain - No Nausea, No new weakness tingling or numbness, Improved Cough & SOB.     Assessment  & Plan :     1. Acute hypoxic Resp failure due to Pneumonia Rule out Aspiration - Continue vancomycin and Zosyn, on D3 diet, speech to evaluate, continue aspiration precautions and feeding assistance, encourage sitting up in the chair, flutter valve added, continue supportive care with oxygen and nebulizer treatments, post gentle IV fluids. Clinically better.   2. H/O Left side Breast CA in a male - lobular breast cancer, accompanied by his wife [name]. Since his last visit here he underwent left simple mastectomy on 08/20/2015. The final pathology ([id number]) confirmed a 2.7 cm invasive lobular carcinoma, grade 3, which was estrogen receptor 90% positive with moderate staining intensity, progesterone receptor 90% positive with strong staining intensity, and HER-2  nonamplified, with a signals ratio of 1.50, the number per cell being 2.25. Following Dr. Jana Hakim oncologist and currently on tamoxifen treatment.  3. Toxic encephalopathy on top of dementia. Due to #1 above. Mentation has improved, exam is nonfocal and CT head unremarkable. Continue supportive care, minimize narcotics and benzodiazepines, will remain at risk for delirium.  4. BPH. On  Proscar and Flomax.  5. Hypothyroidism. On Synthroid continue.  6. Depression. On Abilify along with Remeron. Hold rivastigmine due to encephalopathy.  7. Chronic pain. On methadone. Started at less than half home dose to avoid any abrupt withdrawal.    Family Communication  :  None rpesent  Code Status :  DNR  Diet : D3 diet  Disposition Plan  :  Stay inpt  Consults  :    Procedures  :    CT Head Non acute  DVT Prophylaxis  :  Lovenox    Lab Results  Component Value Date   PLT 119* 09/22/2015    Inpatient Medications  Scheduled Meds: . antiseptic oral rinse  7 mL Mouth Rinse BID  . ARIPiprazole  15 mg Oral Daily  . enoxaparin (LOVENOX) injection  40 mg Subcutaneous Q24H  . finasteride  5 mg Oral QHS  . gabapentin  600 mg Oral TID  . levothyroxine  88 mcg Oral QAC breakfast  . linaclotide  290 mcg Oral BH-q7a  . methadone  5 mg Oral Q12H  . pantoprazole  40 mg Oral QAC breakfast  . piperacillin-tazobactam (ZOSYN)  IV  3.375 g Intravenous Q8H  . polyethylene glycol  17 g Oral QHS  . senna-docusate  2 tablet Oral QHS  . tamoxifen  20 mg Oral Daily  . tamsulosin  0.4 mg Oral QHS  . vancomycin  1,000 mg Intravenous Q24H  . cyanocobalamin  1,000 mcg Oral Daily   Continuous Infusions:   PRN Meds:.albuterol, haloperidol **OR** haloperidol lactate  Antibiotics  :    Anti-infectives    Start     Dose/Rate Route Frequency Ordered Stop   09/21/15 1700  vancomycin (VANCOCIN) IVPB 1000 mg/200 mL premix     1,000 mg 200 mL/hr over 60 Minutes Intravenous Every 24 hours 09/20/15 1626      09/21/15 0200  piperacillin-tazobactam (ZOSYN) IVPB 3.375 g     3.375 g 12.5 mL/hr over 240 Minutes Intravenous Every 8 hours 09/20/15 1626     09/20/15 1545  vancomycin (VANCOCIN) IVPB 1000 mg/200 mL premix     1,000 mg 200 mL/hr over 60 Minutes Intravenous  Once 09/20/15 1541 09/20/15 1747   09/20/15 1545  piperacillin-tazobactam (ZOSYN) IVPB 3.375 g     3.375 g 100 mL/hr over 30 Minutes Intravenous  Once 09/20/15 1541 09/20/15 1717         Objective:   Filed Vitals:   09/21/15 0450 09/21/15 1338 09/21/15 2114 09/22/15 0450  BP: 107/66 95/47 97/42  104/49  Pulse: 84  56 61  Temp: 98.3 F (36.8 C) 97.8 F (36.6 C) 97.7 F (36.5 C) 98.7 F (37.1 C)  TempSrc: Oral Oral Axillary Axillary  Resp: 20 18 18 16   Height:      Weight:      SpO2: 92% 93% 97% 96%    Wt Readings from Last 3 Encounters:  09/20/15 82.3 kg (181 lb 7 oz)  09/01/15 81.647 kg (180 lb)  08/22/15 85.276 kg (188 lb)     Intake/Output Summary (Last 24 hours) at 09/22/15 0956 Last data filed at 09/22/15 0600  Gross per 24 hour  Intake   2000 ml  Output    550 ml  Net   1450 ml     Physical Exam  Awake Alert, Oriented X 2, No new F.N deficits, Normal affect [name],PERRAL Supple Neck,No JVD, No cervical lymphadenopathy appriciated.  Symmetrical Chest wall movement, Good air movement bilaterally, RLL rales, L chest wall surgical  scar under bandage, no cellulitis around RRR,No Gallops,Rubs or new Murmurs, No Parasternal Heave +ve B.Sounds, Abd Soft, No tenderness, No organomegaly appriciated, No rebound - guarding or rigidity. No Cyanosis, Clubbing or edema, No new Rash or bruise       Data Review:    CBC  Recent Labs Lab 09/20/15 1451 09/21/15 0515 09/22/15 0457  WBC 9.7 7.0 5.3  HGB 11.9* 11.5* 9.3*  HCT 34.8* 34.2* 27.4*  PLT 140* 138* 119*  MCV 99.1 102.4* 101.9*  MCH 33.9 34.4* 34.6*  MCHC 34.2 33.6 33.9  RDW 13.8 14.0 14.3  LYMPHSABS 0.6*  --   --   MONOABS 0.6  --   --    EOSABS 0.0  --   --   BASOSABS 0.0  --   --     Chemistries   Recent Labs Lab 09/20/15 1451 09/21/15 0515 09/22/15 0457  NA 140 142 138  K 4.3 4.1 4.1  CL 109 113* 109  CO2 23 22 23   GLUCOSE 146* 100* 104*  BUN 32* 29* 27*  CREATININE 1.57* 1.23 1.19  CALCIUM 8.3* 8.1* 7.5*  AST 39 46*  --   ALT 17 17  --   ALKPHOS 45 43  --   BILITOT 1.2 0.8  --    ------------------------------------------------------------------------------------------------------------------ No results for input(s): CHOL, HDL, LDLCALC, TRIG, CHOLHDL, LDLDIRECT in the last 72 hours.  No results found for: HGBA1C ------------------------------------------------------------------------------------------------------------------ No results for input(s): TSH, T4TOTAL, T3FREE, THYROIDAB in the last 72 hours.  Invalid input(s): FREET3 ------------------------------------------------------------------------------------------------------------------ No results for input(s): VITAMINB12, FOLATE, FERRITIN, TIBC, IRON, RETICCTPCT in the last 72 hours.  Lactic Acid, Venous    Component Value Date/Time   LATICACIDVEN 1.3 09/20/2015 2027     Coagulation profile No results for input(s): INR, PROTIME in the last 168 hours.  No results for input(s): DDIMER in the last 72 hours.  Cardiac Enzymes No results for input(s): CKMB, TROPONINI, MYOGLOBIN in the last 168 hours.  Invalid input(s): CK ------------------------------------------------------------------------------------------------------------------ No results found for: BNP  Micro Results Recent Results (from the past 240 hour(s))  Blood culture (routine x 2)     Status: None (Preliminary result)   Collection Time: 09/20/15  2:51 PM  Result Value Ref Range Status   Specimen Description BLOOD RIGHT ANTECUBITAL  Final   Special Requests BOTTLES DRAWN AEROBIC AND ANAEROBIC 5 CC EACH  Final   Culture   Final    NO GROWTH < 24 HOURS Performed at [facility]    Report Status PENDING  Incomplete  Blood culture (routine x 2)     Status: None (Preliminary result)   Collection Time: 09/20/15  4:35 PM  Result Value Ref Range Status   Specimen Description BLOOD RIGHT ARM  Final   Special Requests BOTTLES DRAWN AEROBIC AND ANAEROBIC 5CC  Final   Culture   Final    NO GROWTH < 24 HOURS Performed at [facility]    Report Status PENDING  Incomplete    Radiology Reports Dg Chest 2 View  09/20/2015  CLINICAL DATA:  Fever and cough EXAM: CHEST  2 VIEW COMPARISON:  08/20/2015 FINDINGS: Cardiac shadow is within normal limits. Increased density is noted in the right lung base projecting in the right lower lobe consistent with acute pneumonia. The left lung is clear. No bony abnormality is noted. IMPRESSION: Right lower lobe pneumonia. Followup PA and lateral chest X-ray is recommended in 3-4 weeks following trial of antibiotic therapy to ensure resolution and  exclude underlying malignancy. Electronically Signed   By: Inez Catalina M.D.   On: 09/20/2015 15:18   Ct Head Wo Contrast  09/20/2015  CLINICAL DATA:  Patient noted to be much less active than normal with low-grade fever. EXAM: CT HEAD WITHOUT CONTRAST TECHNIQUE: Contiguous axial images were obtained from the base of the skull through the vertex without intravenous contrast. COMPARISON:  10/24/2014 FINDINGS: The ventricles and cisterns are within normal. There is minimal prominence of the sulcal spaces compatible with age related atrophic change. There is mild chronic ischemic microvascular disease. There is no mass, mass effect, shift of midline structures or acute hemorrhage. There is no evidence of acute infarction. Remaining bones and soft tissues are within normal. IMPRESSION: No acute intracranial findings. Chronic ischemic microvascular disease and mild age related atrophic change. Electronically Signed   By: Marin Olp M.D.   On: 09/20/2015 17:19    Time Spent in minutes   30   SINGH,PRASHANT K M.D on 09/22/2015 at 9:56 AM  Between 7am to 7pm - Pager - [phone]  After 7pm go to [email or web address] - password [facility]  [facility] -  Office  [phone]

## 2015-09-22 NOTE — Progress Notes (Signed)
Pharmacy Antibiotic Note  George Foster is a 82 y.o. male admitted on 09/20/2015 with PNA.  Pharmacy has been consulted for Vancomycin and Zosyn dosing.  Plan: Change to Vancomycin 750mg  q12h for improved renal function Continue Zosyn 3.375g IV q8h (infuse over 4 hours) Consider checking MRSA PCR and if negative, discontinue vancomycin  F/u renal function, cultures, clinical course  Height: 5\' 8"  (172.7 cm) Weight: 181 lb 7 oz (82.3 kg) IBW/kg (Calculated) : 68.4  Temp (24hrs), Avg:98.1 F (36.7 C), Min:97.7 F (36.5 C), Max:98.7 F (37.1 C)   Recent Labs Lab 09/20/15 1451 09/20/15 1509 09/20/15 1811 09/20/15 2027 09/21/15 0515 09/22/15 0457  WBC 9.7  --   --   --  7.0 5.3  CREATININE 1.57*  --   --   --  1.23 1.19  LATICACIDVEN  --  1.86 1.4 1.3  --   --     Estimated Creatinine Clearance: 44 mL/min (by C-G formula based on Cr of 1.19).    Allergies  Allergen Reactions  . Cogentin [Benztropine] Other (See Comments)    unkown    Antimicrobials this admission: 7/1 vanc >>  7/1 Zosyn >>   Dose adjustments this admission: 7/3: Increase Vanc 1g q24h --> 750mg  q12h for improved renal fxn  Microbiology results: 7/2 BCx: IP 7/2 UCx: 4K insignificant growth Thank you for allowing pharmacy to be a part of this patient's care.  Ralene Bathe, PharmD, BCPS 09/22/2015, 10:28 AM  Pager: [phone]

## 2015-09-22 NOTE — Progress Notes (Signed)
MBSS complete. Full report located under chart review in imaging section. Renso Swett, MA CCC-SLP [phone]  

## 2015-09-22 NOTE — Progress Notes (Signed)
Speech Language Pathology Treatment: Dysphagia  Patient Details Name: ABDULSALAM Foster MRN: [id number] DOB: 04-03-1924 Today's Date: 09/22/2015 Time: [id number] SLP Time Calculation (min) (ACUTE ONLY): 18 min  Assessment / Plan / Recommendation Clinical Impression  Pt seen for further trials diagnostic assessment; SLP identified signs concerning for motor sensory deficits and possible aspiration with thin and nectar thick liquids including delayed swallow weak swallow response and immediaite and delayed coughing. Will /fu with objective swallow assessment. Keep pt NPO except meds in puree until MBS complete.    HPI HPI: Patient is a 82 yo male who was brought here with cc of AMS, fever and cough. He is very confused and not a good historian. He was found hypoxic down to 88% on RA on admission PMHx: breast cancer s/p mastectomy on tamoxifen, dementia, SDH, SI      SLP Plan  MBS     Recommendations  Diet recommendations: NPO Medication Administration: Crushed with puree             Oral Care Recommendations: Oral care BID Plan: MBS     GO               George [place], MA CCC-SLP [phone]  George Foster 09/22/2015, 10:09 AM

## 2015-09-23 MED ORDER — SODIUM CHLORIDE 0.9 % IV SOLN
3.0000 g | Freq: Three times a day (TID) | INTRAVENOUS | Status: DC
  Administered 2015-09-23 – 2015-09-26 (×9): 3 g via INTRAVENOUS
  Filled 2015-09-23 (×10): qty 3

## 2015-09-23 NOTE — Progress Notes (Signed)
Speech Language Pathology Treatment: Dysphagia  Patient Details Name: George Foster MRN: [id number] DOB: Nov 22, 1924 Today's Date: 09/23/2015 Time: [id number] SLP Time Calculation (min) (ACUTE ONLY): 9 min  Assessment / Plan / Recommendation Clinical Impression  Pt more lethargic today after pm meal resulting in longer delay in swallow initiation and inability to follow commands to compensate. Minimal trials given with cough following 1/3 swallows.Recommend pt continue current diet when alert. Will f/u for tolerance and training as pt is able.    HPI HPI: Patient is a 82 yo male who was brought here with cc of AMS, fever and cough. He is very confused and not a good historian. He was found hypoxic down to 88% on RA on admission PMHx: breast cancer s/p mastectomy on tamoxifen, dementia, SDH, SI      SLP Plan  Continue with current plan of care     Recommendations  Diet recommendations: Dysphagia 1 (puree);Nectar-thick liquid Liquids provided via: Cup;Teaspoon Medication Administration: Crushed with puree Supervision: Full supervision/cueing for compensatory strategies Compensations: Minimize environmental distractions;Slow rate;Small sips/bites;Multiple dry swallows after each bite/sip Postural Changes and/or Swallow Maneuvers: Seated upright 90 degrees             Follow up Recommendations: Skilled Nursing facility Plan: Continue with current plan of care     [place] Lenita Peregrina, MA CCC-SLP [phone]  Lynann Beaver 09/23/2015, 1:32 PM

## 2015-09-23 NOTE — Progress Notes (Signed)
Pharmacy Antibiotic Note  George Foster is a 82 y.o. male admitted on 09/20/2015 with PNA.  Pharmacy has been consulted for Vancomycin and Zosyn dosing.  MD narrowing to Unasyn on 7/4.    Plan: Unasyn 3g IV q8h F/u renal function, cultures, clinical course  Height: 5\' 8"  (172.7 cm) Weight: 181 lb 7 oz (82.3 kg) IBW/kg (Calculated) : 68.4  Temp (24hrs), Avg:98.5 F (36.9 C), Min:98 F (36.7 C), Max:98.8 F (37.1 C)   Recent Labs Lab 09/20/15 1451 09/20/15 1509 09/20/15 1811 09/20/15 2027 09/21/15 0515 09/22/15 0457  WBC 9.7  --   --   --  7.0 5.3  CREATININE 1.57*  --   --   --  1.23 1.19  LATICACIDVEN  --  1.86 1.4 1.3  --   --     Estimated Creatinine Clearance: 44 mL/min (by C-G formula based on Cr of 1.19).    Allergies  Allergen Reactions  . Cogentin [Benztropine] Other (See Comments)    unkown    Antimicrobials this admission: 7/1 vanc >> 7/4 7/1 Zosyn >> 7/4  Dose adjustments this admission: 7/3: Increase Vanc 1g q24h --> 750mg  q12h for improved renal fxn  Microbiology results: 7/2 BCx: IP 7/2 UCx: 4K insignificant growth Thank you for allowing pharmacy to be a part of this patient's care.  Ralene Bathe, PharmD, BCPS 09/23/2015, 11:12 AM  Pager: [phone]

## 2015-09-23 NOTE — Progress Notes (Signed)
Physical Therapy Treatment Patient Details Name: George Foster MRN: [id number] DOB: 1924-10-15 Today's Date: 09/23/2015    History of Present Illness Patient is a 82 yo male   who was brought here with cc of AMS, fever and cough. He is very confused and not a good historian. He was found hypoxic down to 88% on RA on admission PMHx: breast cancer s/p mastectomy on tamoxifen, dementia, SDH, SI    PT Comments    Pt requires + 2 assist and will need ST Rehab at SNF  Follow Up Recommendations  SNF     Equipment Recommendations       Recommendations for Other Services       Precautions / Restrictions Precautions Precautions: Fall Restrictions Weight Bearing Restrictions: No    Mobility  Bed Mobility Overal bed mobility: Needs Assistance Bed Mobility: Supine to Sit     Supine to sit: Max assist;+2 for physical assistance;+2 for safety/equipment     General bed mobility comments: repeat functional VC's and increased timw with poor sitting balance.   Posterior lean with difficulty righting self to midline.    Transfers Overall transfer level: Needs assistance Equipment used: Rolling walker (2 wheeled) Transfers: Sit to/from Stand Sit to Stand: Max assist;+2 physical assistance;+2 safety/equipment         General transfer comment: assist to rise and stabilize, pt with posterior bias, flexed trunk and bracing LEs on chair to remain standing, very unsteady  Ambulation/Gait Ambulation/Gait assistance: Max assist;Total assist;+2 physical assistance;+2 safety/equipment Ambulation Distance (Feet): 22 Feet Assistive device: Rolling walker (2 wheeled) Gait Pattern/deviations: Step-to pattern;Decreased step length - right;Decreased step length - left Gait velocity: decreased   General Gait Details: very unsteady rigid gait with difficulty "stepping".  3rd assist following with recliner.  Severe posterior lean and poor self righting.   Stairs            Wheelchair  Mobility    Modified Rankin (Stroke Patients Only)       Balance                                    Cognition Arousal/Alertness: Awake/alert   Overall Cognitive Status: History of cognitive impairments - at baseline                      Exercises      General Comments        Pertinent Vitals/Pain Pain Assessment: No/denies pain    Home Living                      Prior Function            PT Goals (current goals can now be found in the care plan section) Progress towards PT goals: Progressing toward goals    Frequency  Min 3X/week    PT Plan Current plan remains appropriate    Co-evaluation             End of Session Equipment Utilized During Treatment: Gait belt Activity Tolerance: Patient limited by fatigue Patient left: in chair;with call bell/phone within reach;with chair alarm set     Time: [id number] PT Time Calculation (min) (ACUTE ONLY): 25 min  Charges:  $Gait Training: 8-22 mins $Therapeutic Activity: 8-22 mins                    G Codes:  Rica Koyanagi  PTA [facility]  Acute  Rehab Pager      [phone]

## 2015-09-23 NOTE — Progress Notes (Signed)
PROGRESS NOTE                                                                                                                                                                                                             Patient Demographics:    Lacy Taglieri, is a 82 y.o. male, DOB - 09-19-1924, UDJ:[id number]  Admit date - 09/20/2015   Admitting Physician Gennaro Africa, MD  Outpatient Primary MD for the patient is [name], MD  LOS - 3  Chief Complaint  Patient presents with  . Altered Mental Status       Brief Narrative    Patient is a 82 yo male with hx of breast cancer s/p mastectomy on tamoxifen who was brought here with cc ofAMS, fever and cough. He is very confused and not a good historian.Hisotry was taken from ER. Patient mental status has gotten worse over the past couple of days. He also was noted to have a cough and a fever. He was found hypoxic down to 88% on RA.  Further workup indicated that patient probably has microaspiration, improving with antibiotics based therapy following, currently on dysphagia 1 diet and nectar thick liquids. Will require another 2-3 days in-hospital care.   Subjective:    Chiquita Loth today has, No headache, No chest pain, No abdominal pain - No Nausea, No new weakness tingling or numbness, Improved Cough & SOB.     Assessment  & Plan :     1. Acute hypoxic Resp failure due to Pneumonia Due to micro- Aspiration - was on vancomycin and Zosyn, clinically better, will cut down to Unasyn on 09/23/2015 covering for aspiration pneumonia,  speech following currently on dysphagia 1 diet with nectar thick liquids, underwent modified barium, continue aspiration precautions and feeding assistance, encourage sitting up in the chair, flutter valve added, continue supportive care with oxygen and nebulizer treatments, is gradually improving will require another 2-3 days in-hospital stay  thereafter likely SNF.  2. H/O Left side Breast CA in a male - lobular breast cancer, accompanied by his wife [name]. Since his last visit here he underwent left simple mastectomy on 08/20/2015. The final pathology ([id number]) confirmed a 2.7 cm invasive lobular carcinoma, grade 3, which was estrogen receptor 90% positive with moderate staining intensity, progesterone receptor 90% positive with strong staining intensity, and HER-2  nonamplified, with a signals ratio of 1.50, the number per cell being 2.25. Following Dr. Jana Hakim oncologist and currently on tamoxifen treatment.  3. Toxic encephalopathy on top of dementia. Due to #1 above. Mentation has improved, exam is nonfocal and CT head unremarkable. Continue supportive care, minimize narcotics and benzodiazepines, will remain at risk for delirium.  4. BPH. On Proscar and Flomax.  5. Hypothyroidism. On Synthroid continue.  6. Depression. On Abilify along with Remeron. Hold rivastigmine due to encephalopathy.  7. Chronic pain. On methadone. Started at less than half home dose to avoid any abrupt withdrawal.  8. AOCD with mild dilution from IVF, no need to transfuse.    Family Communication  :  None rpesent  Code Status :  DNR  Diet : Dysphagia 1 diet with nectar thick liquids  Disposition Plan  :  Stay inpt  Consults  :    Procedures  :    CT Head Non acute  DVT Prophylaxis  :  Lovenox    Lab Results  Component Value Date   PLT 119* 09/22/2015    Inpatient Medications  Scheduled Meds: . antiseptic oral rinse  7 mL Mouth Rinse BID  . ARIPiprazole  15 mg Oral Daily  . enoxaparin (LOVENOX) injection  40 mg Subcutaneous Q24H  . finasteride  5 mg Oral QHS  . gabapentin  600 mg Oral TID  . levothyroxine  88 mcg Oral QAC breakfast  . linaclotide  290 mcg Oral BH-q7a  . methadone  5 mg Oral Q12H  . pantoprazole  40 mg Oral QAC breakfast  . piperacillin-tazobactam (ZOSYN)  IV  3.375 g Intravenous Q8H  . polyethylene  glycol  17 g Oral QHS  . senna-docusate  2 tablet Oral QHS  . tamoxifen  20 mg Oral Daily  . tamsulosin  0.4 mg Oral QHS  . vancomycin  750 mg Intravenous Q12H  . cyanocobalamin  1,000 mcg Oral Daily   Continuous Infusions:   PRN Meds:.albuterol, haloperidol **OR** [DISCONTINUED] haloperidol lactate, RESOURCE THICKENUP CLEAR  Antibiotics  :    Anti-infectives    Start     Dose/Rate Route Frequency Ordered Stop   09/22/15 1100  vancomycin (VANCOCIN) IVPB 750 mg/150 ml premix     750 mg 150 mL/hr over 60 Minutes Intravenous Every 12 hours 09/22/15 1030     09/21/15 1700  vancomycin (VANCOCIN) IVPB 1000 mg/200 mL premix  Status:  Discontinued     1,000 mg 200 mL/hr over 60 Minutes Intravenous Every 24 hours 09/20/15 1626 09/22/15 1029   09/21/15 0200  piperacillin-tazobactam (ZOSYN) IVPB 3.375 g     3.375 g 12.5 mL/hr over 240 Minutes Intravenous Every 8 hours 09/20/15 1626     09/20/15 1545  vancomycin (VANCOCIN) IVPB 1000 mg/200 mL premix     1,000 mg 200 mL/hr over 60 Minutes Intravenous  Once 09/20/15 1541 09/20/15 1747   09/20/15 1545  piperacillin-tazobactam (ZOSYN) IVPB 3.375 g     3.375 g 100 mL/hr over 30 Minutes Intravenous  Once 09/20/15 1541 09/20/15 1717         Objective:   Filed Vitals:   09/22/15 0450 09/22/15 1421 09/22/15 1955 09/23/15 0622  BP: 104/49 121/57 113/52 111/49  Pulse: 61 68 73 62  Temp: 98.7 F (37.1 C) 98 F (36.7 C) 98.8 F (37.1 C) 98.8 F (37.1 C)  TempSrc: Axillary Axillary Oral Axillary  Resp: 16 18 18 18   Height:      Weight:  SpO2: 96% 98% 100% 93%    Wt Readings from Last 3 Encounters:  09/20/15 82.3 kg (181 lb 7 oz)  09/01/15 81.647 kg (180 lb)  08/22/15 85.276 kg (188 lb)     Intake/Output Summary (Last 24 hours) at 09/23/15 1108 Last data filed at 09/23/15 0820  Gross per 24 hour  Intake    460 ml  Output    600 ml  Net   -140 ml     Physical Exam  Awake Alert, Oriented X 2, No new F.N deficits, Normal  affect [place].AT,PERRAL Supple Neck,No JVD, No cervical lymphadenopathy appriciated.  Symmetrical Chest wall movement, Good air movement bilaterally, RLL rales, L chest wall surgical scar under bandage, no cellulitis around RRR,No Gallops,Rubs or new Murmurs, No Parasternal Heave +ve B.Sounds, Abd Soft, No tenderness, No organomegaly appriciated, No rebound - guarding or rigidity. No Cyanosis, Clubbing or edema, No new Rash or bruise       Data Review:    CBC  Recent Labs Lab 09/20/15 1451 09/21/15 0515 09/22/15 0457  WBC 9.7 7.0 5.3  HGB 11.9* 11.5* 9.3*  HCT 34.8* 34.2* 27.4*  PLT 140* 138* 119*  MCV 99.1 102.4* 101.9*  MCH 33.9 34.4* 34.6*  MCHC 34.2 33.6 33.9  RDW 13.8 14.0 14.3  LYMPHSABS 0.6*  --   --   MONOABS 0.6  --   --   EOSABS 0.0  --   --   BASOSABS 0.0  --   --     Chemistries   Recent Labs Lab 09/20/15 1451 09/21/15 0515 09/22/15 0457  NA 140 142 138  K 4.3 4.1 4.1  CL 109 113* 109  CO2 23 22 23   GLUCOSE 146* 100* 104*  BUN 32* 29* 27*  CREATININE 1.57* 1.23 1.19  CALCIUM 8.3* 8.1* 7.5*  AST 39 46*  --   ALT 17 17  --   ALKPHOS 45 43  --   BILITOT 1.2 0.8  --    ------------------------------------------------------------------------------------------------------------------ No results for input(s): CHOL, HDL, LDLCALC, TRIG, CHOLHDL, LDLDIRECT in the last 72 hours.  No results found for: HGBA1C ------------------------------------------------------------------------------------------------------------------ No results for input(s): TSH, T4TOTAL, T3FREE, THYROIDAB in the last 72 hours.  Invalid input(s): FREET3 ------------------------------------------------------------------------------------------------------------------ No results for input(s): VITAMINB12, FOLATE, FERRITIN, TIBC, IRON, RETICCTPCT in the last 72 hours.  Lactic Acid, Venous    Component Value Date/Time   LATICACIDVEN 1.3 09/20/2015 2027     Coagulation profile No  results for input(s): INR, PROTIME in the last 168 hours.  No results for input(s): DDIMER in the last 72 hours.  Cardiac Enzymes No results for input(s): CKMB, TROPONINI, MYOGLOBIN in the last 168 hours.  Invalid input(s): CK ------------------------------------------------------------------------------------------------------------------ No results found for: BNP  Micro Results Recent Results (from the past 240 hour(s))  Blood culture (routine x 2)     Status: None (Preliminary result)   Collection Time: 09/20/15  2:51 PM  Result Value Ref Range Status   Specimen Description BLOOD RIGHT ANTECUBITAL  Final   Special Requests BOTTLES DRAWN AEROBIC AND ANAEROBIC 5 CC EACH  Final   Culture   Final    NO GROWTH 2 DAYS Performed at [facility]    Report Status PENDING  Incomplete  Blood culture (routine x 2)     Status: None (Preliminary result)   Collection Time: 09/20/15  4:35 PM  Result Value Ref Range Status   Specimen Description BLOOD RIGHT ARM  Final   Special Requests BOTTLES DRAWN AEROBIC AND ANAEROBIC  5CC  Final   Culture   Final    NO GROWTH 2 DAYS Performed at [facility]    Report Status PENDING  Incomplete  Urine culture     Status: Abnormal   Collection Time: 09/21/15  5:03 AM  Result Value Ref Range Status   Specimen Description URINE, CLEAN CATCH  Final   Special Requests NONE  Final   Culture (A)  Final    4,000 COLONIES/mL INSIGNIFICANT GROWTH Performed at [facility]    Report Status 09/22/2015 FINAL  Final    Radiology Reports Dg Chest 2 View  09/20/2015  CLINICAL DATA:  Fever and cough EXAM: CHEST  2 VIEW COMPARISON:  08/20/2015 FINDINGS: Cardiac shadow is within normal limits. Increased density is noted in the right lung base projecting in the right lower lobe consistent with acute pneumonia. The left lung is clear. No bony abnormality is noted. IMPRESSION: Right lower lobe pneumonia. Followup PA and lateral chest X-ray is  recommended in 3-4 weeks following trial of antibiotic therapy to ensure resolution and exclude underlying malignancy. Electronically Signed   By: Inez Catalina M.D.   On: 09/20/2015 15:18   Ct Head Wo Contrast  09/20/2015  CLINICAL DATA:  Patient noted to be much less active than normal with low-grade fever. EXAM: CT HEAD WITHOUT CONTRAST TECHNIQUE: Contiguous axial images were obtained from the base of the skull through the vertex without intravenous contrast. COMPARISON:  10/24/2014 FINDINGS: The ventricles and cisterns are within normal. There is minimal prominence of the sulcal spaces compatible with age related atrophic change. There is mild chronic ischemic microvascular disease. There is no mass, mass effect, shift of midline structures or acute hemorrhage. There is no evidence of acute infarction. Remaining bones and soft tissues are within normal. IMPRESSION: No acute intracranial findings. Chronic ischemic microvascular disease and mild age related atrophic change. Electronically Signed   By: Marin Olp M.D.   On: 09/20/2015 17:19   Dg Swallowing Func-speech Pathology  09/22/2015  Objective Swallowing Evaluation: Type of Study: MBS-Modified Barium Swallow Study Patient Details Name: NEDIM OKI MRN: [id number] Date of Birth: 1925-03-19 Today's Date: 09/22/2015 Time: SLP Start Time (ACUTE ONLY): 1230-SLP Stop Time (ACUTE ONLY): 1250 SLP Time Calculation (min) (ACUTE ONLY): 20 min Past Medical History: Past Medical History Diagnosis Date . Dementia  . Hypertension  . Arthritis  . Hypothyroidism  . Chronic back pain  . Anxiety disorder  . Gastroesophageal reflux disease  . Polypharmacy    History of hospitalizations for altered mental status related to medications . Chronic constipation    with fecal impactions, recurrent . Suicidal ideation 06/06/2011 . Traumatic subdural hematoma ([place]) 10/01/2013 . Multiple facial fractures ([place]) 10/02/2013 . Basilar skull fracture ([place]) 10/02/2013 . Acute blood loss  anemia 10/02/2013 . Neuropathy ([place])  . Depression  . Mood disorder ([place])  . BPH (benign prostatic hyperplasia)  . Tremor  . Osteoarthritis  . Anxiety  . UTI (lower urinary tract infection)  . Cancer of left breast [facility])  Past Surgical History: Past Surgical History Procedure Laterality Date . Appendectomy   . Multiple tooth extractions     Removal of Nonrestorable teeth numbers 2, 4, 6, 12, 13, 18, 29 . Skin surgery     Cancer cells removed . Prostate surgery     removed x2 . Eye surgery   . Mastectomy  08/20/2015 . Total mastectomy Left 08/20/2015   Procedure: LEFT TOTAL MASTECTOMY;  Surgeon: Fanny Skates, MD;  Location: [facility];  Service: General;  Laterality: Left; HPI: Patient is a 82 yo male who was brought here with cc of AMS, fever and cough. He is very confused and not a good historian. He was found hypoxic down to 88% on RA on admission PMHx: breast cancer s/p mastectomy on tamoxifen, dementia, SDH, SI Subjective: pt mostly keeps his eyes closed but participates in PO trials Assessment / Plan / Recommendation CHL IP CLINICAL IMPRESSIONS 09/22/2015 Therapy Diagnosis Moderate pharyngeal phase dysphagia;Moderate oral phase dysphagia Clinical Impression Pt demonstrated poor oral formation of the bolus with pumping and continual spillage to the oropharynx with liquids pooling in the pyriforms before the swallow. As swallow is intiated thin and large nectar thick boluses penetrate the airway and in some cases are aspirated with sensation. Provided nectar thick liquids, regualting bolus size with small sips reduce risk. Pt should also be encouraged to swallow twice to clear oral residuals that spill tot he pharynx post swallow. Aspiration risk will persist as long as pt remains lethargic and less alert during PO intake. SLP will follow for tolerance.  Impact on safety and function Moderate aspiration risk   CHL IP TREATMENT RECOMMENDATION 09/22/2015 Treatment Recommendations Therapy as outlined in treatment plan  below   Prognosis 09/22/2015 Prognosis for Safe Diet Advancement Fair Barriers to Reach Goals -- Barriers/Prognosis Comment -- CHL IP DIET RECOMMENDATION 09/22/2015 SLP Diet Recommendations Dysphagia 1 (Puree) solids;Nectar thick liquid Liquid Administration via Cup;No straw Medication Administration Whole meds with puree Compensations Slow rate;Small sips/bites;Multiple dry swallows after each bite/sip Postural Changes Remain semi-upright after after feeds/meals (Comment);Seated upright at 90 degrees   CHL IP OTHER RECOMMENDATIONS 09/22/2015 Recommended Consults -- Oral Care Recommendations Oral care BID Other Recommendations Order thickener from pharmacy   CHL IP FOLLOW UP RECOMMENDATIONS 09/22/2015 Follow up Recommendations Skilled Nursing facility   [facility] IP FREQUENCY AND DURATION 09/22/2015 Speech Therapy Frequency (ACUTE ONLY) min 2x/week Treatment Duration 2 weeks      CHL IP ORAL PHASE 09/22/2015 Oral Phase Impaired Oral - Pudding Teaspoon -- Oral - Pudding Cup -- Oral - Honey Teaspoon -- Oral - Honey Cup -- Oral - Nectar Teaspoon -- Oral - Nectar Cup Reduced posterior propulsion;Incomplete tongue to palate contact;Lingual/palatal residue;Delayed oral transit;Decreased bolus cohesion;Premature spillage Oral - Nectar Straw Reduced posterior propulsion;Incomplete tongue to palate contact;Lingual/palatal residue;Delayed oral transit;Decreased bolus cohesion;Premature spillage Oral - Thin Teaspoon -- Oral - Thin Cup Reduced posterior propulsion;Incomplete tongue to palate contact;Lingual/palatal residue;Delayed oral transit;Decreased bolus cohesion;Premature spillage Oral - Thin Straw -- Oral - Puree Reduced posterior propulsion;Incomplete tongue to palate contact;Lingual/palatal residue;Delayed oral transit;Decreased bolus cohesion;Premature spillage Oral - Mech Soft -- Oral - Regular Reduced posterior propulsion;Incomplete tongue to palate contact;Lingual/palatal residue;Delayed oral transit;Decreased bolus  cohesion;Premature spillage Oral - Multi-Consistency -- Oral - Pill Reduced posterior propulsion;Incomplete tongue to palate contact;Lingual/palatal residue;Delayed oral transit;Decreased bolus cohesion;Premature spillage Oral Phase - Comment --  CHL IP PHARYNGEAL PHASE 09/22/2015 Pharyngeal Phase Impaired Pharyngeal- Pudding Teaspoon -- Pharyngeal -- Pharyngeal- Pudding Cup -- Pharyngeal -- Pharyngeal- Honey Teaspoon -- Pharyngeal -- Pharyngeal- Honey Cup -- Pharyngeal -- Pharyngeal- Nectar Teaspoon -- Pharyngeal -- Pharyngeal- Nectar Cup Delayed swallow initiation-pyriform sinuses;Pharyngeal residue - valleculae;Pharyngeal residue - pyriform Pharyngeal -- Pharyngeal- Nectar Straw Delayed swallow initiation-pyriform sinuses;Pharyngeal residue - valleculae;Pharyngeal residue - pyriform;Penetration/Aspiration before swallow;Penetration/Aspiration during swallow;Trace aspiration Pharyngeal Material enters airway, passes BELOW cords then ejected out Pharyngeal- Thin Teaspoon Delayed swallow initiation-pyriform sinuses;Pharyngeal residue - valleculae;Pharyngeal residue - pyriform Pharyngeal -- Pharyngeal- Thin Cup Delayed swallow initiation-pyriform sinuses;Pharyngeal residue - valleculae;Pharyngeal residue -  pyriform;Penetration/Aspiration before swallow;Penetration/Aspiration during swallow;Moderate aspiration Pharyngeal Material enters airway, passes BELOW cords and not ejected out despite cough attempt by patient Pharyngeal- Thin Straw -- Pharyngeal -- Pharyngeal- Puree Delayed swallow initiation-vallecula Pharyngeal -- Pharyngeal- Mechanical Soft -- Pharyngeal -- Pharyngeal- Regular Delayed swallow initiation-vallecula Pharyngeal -- Pharyngeal- Multi-consistency -- Pharyngeal -- Pharyngeal- Pill -- Pharyngeal -- Pharyngeal Comment --  No flowsheet data found. No flowsheet data found. [name], [place] CCC-SLP [phone] [place], Katherene Ponto 09/22/2015, 1:24 PM               Time Spent in minutes   30   Lala Lund K M.D on 09/23/2015 at 11:08 AM  Between 7am to 7pm - Pager - [phone]  After 7pm go to [email or web address] - password [facility]  [facility] -  Office  [phone]

## 2015-09-24 DIAGNOSIS — L8990 Pressure ulcer of unspecified site, unspecified stage: Secondary | ICD-10-CM | POA: Insufficient documentation

## 2015-09-24 DIAGNOSIS — J9601 Acute respiratory failure with hypoxia: Secondary | ICD-10-CM

## 2015-09-24 DIAGNOSIS — G9340 Encephalopathy, unspecified: Secondary | ICD-10-CM

## 2015-09-24 DIAGNOSIS — J690 Pneumonitis due to inhalation of food and vomit: Principal | ICD-10-CM

## 2015-09-24 DIAGNOSIS — F4323 Adjustment disorder with mixed anxiety and depressed mood: Secondary | ICD-10-CM

## 2015-09-24 MED ORDER — LINACLOTIDE 290 MCG PO CAPS
290.0000 ug | ORAL_CAPSULE | Freq: Every day | ORAL | Status: DC
  Administered 2015-09-24 – 2015-09-26 (×3): 290 ug via ORAL
  Filled 2015-09-24 (×3): qty 1

## 2015-09-24 NOTE — Care Management Note (Signed)
Case Management Note  Patient Details  Name: George Foster MRN: [id number] Date of Birth: 1924-07-29  Subjective/Objective:                  Encephalopathy/Asp PNA Action/Plan: Discharge planning Expected Discharge Date:                  Expected Discharge Plan:  [place]  In-House Referral:     Discharge planning Services  CM Consult  Post Acute Care Choice:    Choice offered to:     DME Arranged:    DME Agency:     HH Arranged:    [place] Agency:     Status of Service:  Completed, signed off  If discussed at [employer] of Stay Meetings, dates discussed:    Additional Comments: Cm notes pt to go to SNF upon discharge; CSW aware and arranging.  No other CM needs were communicated. Dellie Catholic, RN 09/24/2015, 12:02 PM

## 2015-09-24 NOTE — Care Management Important Message (Signed)
Important Message  Patient Details  Name: SOLMON BOLASH MRN: [id number] Date of Birth: 1925/02/18   Medicare Important Message Given:  Yes    Camillo Flaming 09/24/2015, 9:36 AMImportant Message  Patient Details  Name: AVANTE WITTKOPP MRN: [id number] Date of Birth: 1924-04-01   Medicare Important Message Given:  Yes    Camillo Flaming 09/24/2015, 9:36 AM

## 2015-09-24 NOTE — Progress Notes (Signed)
Physical Therapy Treatment Patient Details Name: George Foster MRN: [id number] DOB: 30-Jan-1925 Today's Date: 09/24/2015    History of Present Illness Patient is a 82 yo male   who was brought here with cc of AMS, fever and cough. He is very confused and not a good historian. He was found hypoxic down to 88% on RA on admission PMHx: breast cancer s/p mastectomy on tamoxifen, dementia, SDH, SI    PT Comments    Difficult to arouse AxO x1 only.  With increased time pt was able to follow instructions and demonstrate appropriate conversation.   Assisted OOB to [facility] as precaution.  Did have small BM.  Assisted with hygiene.  Amb in hallway using B platform EVA walker due to gait instability.  Positioned in recliner.    Follow Up Recommendations  SNF     Equipment Recommendations       Recommendations for Other Services       Precautions / Restrictions Precautions Precautions: Fall Restrictions Weight Bearing Restrictions: No    Mobility  Bed Mobility Overal bed mobility: Needs Assistance Bed Mobility: Supine to Sit     Supine to sit: Max assist;+2 for physical assistance;+2 for safety/equipment     General bed mobility comments: repeat instruction to increase self initiating and increased time to prcess direction.  assisted to EOB utilizing bed pad to complete self scooting.    Transfers Overall transfer level: Needs assistance Equipment used: None Transfers: Sit to/from Stand Sit to Stand: Max assist;+2 physical assistance;+2 safety/equipment         General transfer comment: + 2 assist rise from elevated bed to [facility] then to amb    Ambulation/Gait Ambulation/Gait assistance: Max assist;+2 physical assistance;+2 safety/equipment Ambulation Distance (Feet): 84 Feet Assistive device: Rolling walker (2 wheeled) Gait Pattern/deviations: Step-to pattern;Decreased step length - right;Decreased step length - left;Trunk flexed Gait velocity: decreased   General Gait Details:  used B platform EVA walker for increased support.  Pt tolerated amb an increased distance.     Stairs            Wheelchair Mobility    Modified Rankin (Stroke Patients Only)       Balance                                    Cognition Arousal/Alertness: Awake/alert Behavior During Therapy: WFL for tasks assessed/performed Overall Cognitive Status: History of cognitive impairments - at baseline                      Exercises      General Comments        Pertinent Vitals/Pain Pain Assessment: No/denies pain    Home Living                      Prior Function            PT Goals (current goals can now be found in the care plan section) Progress towards PT goals: Progressing toward goals    Frequency  Min 3X/week    PT Plan Current plan remains appropriate    Co-evaluation             End of Session Equipment Utilized During Treatment: Gait belt Activity Tolerance: Patient limited by fatigue Patient left: in chair;with call bell/phone within reach;with chair alarm set     Time: [id number] PT Time Calculation (min) (  ACUTE ONLY): 24 min  Charges:  $Gait Training: 8-22 mins $Therapeutic Activity: 8-22 mins                    G Codes:      Rica Koyanagi  PTA [facility]  Acute  Rehab Pager      [phone]

## 2015-09-24 NOTE — Progress Notes (Signed)
PROGRESS NOTE        PATIENT DETAILS Name: George Foster Age: 82 y.o. Sex: male Date of Birth: 08-02-1924 Admit Date: 09/20/2015 Admitting Physician Gennaro Africa, MD [id number], Noah Delaine, MD  Brief Narrative: Patient is a 82 y.o. male with history of breast cancer status post left sided mastectomy on 5/31 admitted with acute encephalopathy and presumed aspiration pneumonia. Slowly improving with empiric antibiotic therapy and other supportive measures.  Subjective: Sleepy this morning-opened his eyes after a lot of request. Follows commands, answers questions appropriately. Still appears weak and chronically sick appearing.  Assessment/Plan: Acute hypoxic respiratory failure secondary to aspiration pneumonia: Slowly Improving with empiric antibiotics. Blood cultures remain negative so far, continue with empiric Unasyn, suspect requires another 1-2 days of hospitalization. Evaluated by speech therapy, currently on a dysphagia 1 diet.  Acute metabolic encephalopathy: Secondary to above, improving with antibiotics treatment and other supportive measures. CT head negative for acute abnormalities. Sleepy this morning, but upon repeated questioning awakes and follows commands.  Dysphagia: I suspect worsened following recent surgery-seen by speech therapy, underwent modified barium swallow-currently on a dysphagia 1 diet. Spoke with patient's spouse over the phone, explained that patient continues to be at some risk of aspiration-she is understanding of the situation.  Anemia secondary to chronic disease: slight drop in hemoglobin secondary to IV fluid dilution. Follow.  Mild thrombocytopenia: Appears chronic, stable for outpatient follow-up with hematology/oncology  History of ER positive left breast cancer-status post left mastectomy 08/20/15: Reviewed most recent outpatient oncology note, plans are to continue tamoxifen for at least 5 years and perform yearly  mammographies.  Hypothyroidism: Continue Synthroid  BPH: Continue Proscar and Flomax.  Depression: Cautiously continue with Abilify and Remeron.  Suspected history of dementia: Resume home medications once encephalopathy improves further.  Chronic pain syndrome: On methadone at half his usual regimen-suspect contributing to encephalopathy-but unable to stop due to risk of withdrawal symptoms.  DVT Prophylaxis: Prophylactic Lovenox  Code Status: DNR  Family Communication: Spoke with spouse over phone  Disposition Plan: Remain inpatient- SNF on discharge in next 1-2 days  Antimicrobial agents: IV Unasyn 7/4>> IV vancomycin 7/1>> 7/5 IV Zosyn 7/1>> 7/5  Procedures: None  CONSULTS:  None  Time spent: 25 minutes-Greater than 50% of this time was spent in counseling, explanation of diagnosis, planning of further management, and coordination of care.  MEDICATIONS: Anti-infectives    Start     Dose/Rate Route Frequency Ordered Stop   09/23/15 1200  Ampicillin-Sulbactam (UNASYN) 3 g in sodium chloride 0.9 % 100 mL IVPB     3 g 100 mL/hr over 60 Minutes Intravenous Every 8 hours 09/23/15 1116     09/22/15 1100  vancomycin (VANCOCIN) IVPB 750 mg/150 ml premix  Status:  Discontinued     750 mg 150 mL/hr over 60 Minutes Intravenous Every 12 hours 09/22/15 1030 09/23/15 1110   09/21/15 1700  vancomycin (VANCOCIN) IVPB 1000 mg/200 mL premix  Status:  Discontinued     1,000 mg 200 mL/hr over 60 Minutes Intravenous Every 24 hours 09/20/15 1626 09/22/15 1029   09/21/15 0200  piperacillin-tazobactam (ZOSYN) IVPB 3.375 g  Status:  Discontinued     3.375 g 12.5 mL/hr over 240 Minutes Intravenous Every 8 hours 09/20/15 1626 09/23/15 1110   09/20/15 1545  vancomycin (VANCOCIN) IVPB 1000 mg/200 mL premix     1,000 mg  200 mL/hr over 60 Minutes Intravenous  Once 09/20/15 1541 09/20/15 1747   09/20/15 1545  piperacillin-tazobactam (ZOSYN) IVPB 3.375 g     3.375 g 100 mL/hr over 30  Minutes Intravenous  Once 09/20/15 1541 09/20/15 1717      Scheduled Meds: . ampicillin-sulbactam (UNASYN) IV  3 g Intravenous Q8H  . antiseptic oral rinse  7 mL Mouth Rinse BID  . ARIPiprazole  15 mg Oral Daily  . enoxaparin (LOVENOX) injection  40 mg Subcutaneous Q24H  . finasteride  5 mg Oral QHS  . gabapentin  600 mg Oral TID  . levothyroxine  88 mcg Oral QAC breakfast  . linaclotide  290 mcg Oral QAC breakfast  . methadone  5 mg Oral Q12H  . pantoprazole  40 mg Oral QAC breakfast  . polyethylene glycol  17 g Oral QHS  . senna-docusate  2 tablet Oral QHS  . tamoxifen  20 mg Oral Daily  . tamsulosin  0.4 mg Oral QHS  . cyanocobalamin  1,000 mcg Oral Daily   Continuous Infusions:  PRN Meds:.albuterol, haloperidol **OR** [DISCONTINUED] haloperidol lactate, RESOURCE THICKENUP CLEAR   PHYSICAL EXAM: Vital signs: Filed Vitals:   09/23/15 0622 09/23/15 1452 09/23/15 2254 09/24/15 0358  BP: 111/49 127/52 96/70 121/63  Pulse: 62 51 47 54  Temp: 98.8 F (37.1 C) 98.6 F (37 C) 98.8 F (37.1 C) 98.3 F (36.8 C)  TempSrc: Axillary Axillary Axillary Axillary  Resp: 18 20 20 18   Height:      Weight:      SpO2: 93% 93% 93% 94%   Filed Weights   09/20/15 1754  Weight: 82.3 kg (181 lb 7 oz)   Body mass index is 27.59 kg/(m^2).   Gen Exam:Sleeping this morning, but easily wakes. Looks chronically sick but not in any acute distress.  Neck: Supple, No JVD.   Chest: Some bibasilar rales but otherwise clear CVS: S1 S2 Regular, no murmurs.  Abdomen: soft, BS +, non tender, non distended.  Extremities: no edema, lower extremities warm to touch. Neurologic: Non Focal.   Skin: No Rash or lesions   Wounds: N/A.    LABORATORY DATA: CBC:  Recent Labs Lab 09/20/15 1451 09/21/15 0515 09/22/15 0457  WBC 9.7 7.0 5.3  NEUTROABS 8.5*  --   --   HGB 11.9* 11.5* 9.3*  HCT 34.8* 34.2* 27.4*  MCV 99.1 102.4* 101.9*  PLT 140* 138* 119*    Basic Metabolic Panel:  Recent  Labs Lab 09/20/15 1451 09/21/15 0515 09/22/15 0457  NA 140 142 138  K 4.3 4.1 4.1  CL 109 113* 109  CO2 23 22 23   GLUCOSE 146* 100* 104*  BUN 32* 29* 27*  CREATININE 1.57* 1.23 1.19  CALCIUM 8.3* 8.1* 7.5*    GFR: Estimated Creatinine Clearance: 44 mL/min (by C-G formula based on Cr of 1.19).  Liver Function Tests:  Recent Labs Lab 09/20/15 1451 09/21/15 0515  AST 39 46*  ALT 17 17  ALKPHOS 45 43  BILITOT 1.2 0.8  PROT 6.2* 5.8*  ALBUMIN 3.6 3.1*   No results for input(s): LIPASE, AMYLASE in the last 168 hours. No results for input(s): AMMONIA in the last 168 hours.  Coagulation Profile: No results for input(s): INR, PROTIME in the last 168 hours.  Cardiac Enzymes: No results for input(s): CKTOTAL, CKMB, CKMBINDEX, TROPONINI in the last 168 hours.  BNP (last 3 results) No results for input(s): PROBNP in the last 8760 hours.  HbA1C: No results for input(s): HGBA1C  in the last 72 hours.  CBG: No results for input(s): GLUCAP in the last 168 hours.  Lipid Profile: No results for input(s): CHOL, HDL, LDLCALC, TRIG, CHOLHDL, LDLDIRECT in the last 72 hours.  Thyroid Function Tests: No results for input(s): TSH, T4TOTAL, FREET4, T3FREE, THYROIDAB in the last 72 hours.  Anemia Panel: No results for input(s): VITAMINB12, FOLATE, FERRITIN, TIBC, IRON, RETICCTPCT in the last 72 hours.  Urine analysis:    Component Value Date/Time   COLORURINE AMBER* 09/21/2015 0504   APPEARANCEUR CLOUDY* 09/21/2015 0504   LABSPEC 1.019 09/21/2015 0504   PHURINE 5.0 09/21/2015 0504   GLUCOSEU NEGATIVE 09/21/2015 0504   HGBUR NEGATIVE 09/21/2015 0504   BILIRUBINUR NEGATIVE 09/21/2015 0504   KETONESUR NEGATIVE 09/21/2015 0504   PROTEINUR NEGATIVE 09/21/2015 0504   UROBILINOGEN 1.0 10/30/2014 1639   NITRITE NEGATIVE 09/21/2015 0504   LEUKOCYTESUR NEGATIVE 09/21/2015 0504    Sepsis Labs: Lactic Acid, Venous    Component Value Date/Time   LATICACIDVEN 1.3 09/20/2015 2027     MICROBIOLOGY: Recent Results (from the past 240 hour(s))  Blood culture (routine x 2)     Status: None (Preliminary result)   Collection Time: 09/20/15  2:51 PM  Result Value Ref Range Status   Specimen Description BLOOD RIGHT ANTECUBITAL  Final   Special Requests BOTTLES DRAWN AEROBIC AND ANAEROBIC 5 CC EACH  Final   Culture   Final    NO GROWTH 3 DAYS Performed at [facility]    Report Status PENDING  Incomplete  Blood culture (routine x 2)     Status: None (Preliminary result)   Collection Time: 09/20/15  4:35 PM  Result Value Ref Range Status   Specimen Description BLOOD RIGHT ARM  Final   Special Requests BOTTLES DRAWN AEROBIC AND ANAEROBIC 5CC  Final   Culture   Final    NO GROWTH 3 DAYS Performed at [facility]    Report Status PENDING  Incomplete  Urine culture     Status: Abnormal   Collection Time: 09/21/15  5:03 AM  Result Value Ref Range Status   Specimen Description URINE, CLEAN CATCH  Final   Special Requests NONE  Final   Culture (A)  Final    4,000 COLONIES/mL INSIGNIFICANT GROWTH Performed at [facility]    Report Status 09/22/2015 FINAL  Final    RADIOLOGY STUDIES/RESULTS: Dg Chest 2 View  09/20/2015  CLINICAL DATA:  Fever and cough EXAM: CHEST  2 VIEW COMPARISON:  08/20/2015 FINDINGS: Cardiac shadow is within normal limits. Increased density is noted in the right lung base projecting in the right lower lobe consistent with acute pneumonia. The left lung is clear. No bony abnormality is noted. IMPRESSION: Right lower lobe pneumonia. Followup PA and lateral chest X-ray is recommended in 3-4 weeks following trial of antibiotic therapy to ensure resolution and exclude underlying malignancy. Electronically Signed   By: Inez Catalina M.D.   On: 09/20/2015 15:18   Ct Head Wo Contrast  09/20/2015  CLINICAL DATA:  Patient noted to be much less active than normal with low-grade fever. EXAM: CT HEAD WITHOUT CONTRAST TECHNIQUE: Contiguous axial  images were obtained from the base of the skull through the vertex without intravenous contrast. COMPARISON:  10/24/2014 FINDINGS: The ventricles and cisterns are within normal. There is minimal prominence of the sulcal spaces compatible with age related atrophic change. There is mild chronic ischemic microvascular disease. There is no mass, mass effect, shift of midline structures or acute hemorrhage. There  is no evidence of acute infarction. Remaining bones and soft tissues are within normal. IMPRESSION: No acute intracranial findings. Chronic ischemic microvascular disease and mild age related atrophic change. Electronically Signed   By: Marin Olp M.D.   On: 09/20/2015 17:19   Dg Swallowing Func-speech Pathology  09/22/2015  Objective Swallowing Evaluation: Type of Study: MBS-Modified Barium Swallow Study Patient Details Name: George Foster MRN: [id number] Date of Birth: [birth year] Today's Date: 09/22/2015 Time: SLP Start Time (ACUTE ONLY): 1230-SLP Stop Time (ACUTE ONLY): 1250 SLP Time Calculation (min) (ACUTE ONLY): 20 min Past Medical History: Past Medical History Diagnosis Date . Dementia  . Hypertension  . Arthritis  . Hypothyroidism  . Chronic back pain  . Anxiety disorder  . Gastroesophageal reflux disease  . Polypharmacy    History of hospitalizations for altered mental status related to medications . Chronic constipation    with fecal impactions, recurrent . Suicidal ideation 06/06/2011 . Traumatic subdural hematoma ([place]) 10/01/2013 . Multiple facial fractures ([place]) 10/02/2013 . Basilar skull fracture ([place]) 10/02/2013 . Acute blood loss anemia 10/02/2013 . Neuropathy ([place])  . Depression  . Mood disorder ([place])  . BPH (benign prostatic hyperplasia)  . Tremor  . Osteoarthritis  . Anxiety  . UTI (lower urinary tract infection)  . Cancer of left breast [facility])  Past Surgical History: Past Surgical History Procedure Laterality Date . Appendectomy   . Multiple tooth extractions     Removal of Nonrestorable teeth  numbers 2, 4, 6, 12, 13, 18, 29 . Skin surgery     Cancer cells removed . Prostate surgery     removed x2 . Eye surgery   . Mastectomy  08/20/2015 . Total mastectomy Left 08/20/2015   Procedure: LEFT TOTAL MASTECTOMY;  Surgeon: Fanny Skates, MD;  Location: [place];  Service: General;  Laterality: Left; HPI: Patient is a 82 yo male who was brought here with cc of AMS, fever and cough. He is very confused and not a good historian. He was found hypoxic down to 88% on RA on admission PMHx: breast cancer s/p mastectomy on tamoxifen, dementia, SDH, SI Subjective: pt mostly keeps his eyes closed but participates in PO trials Assessment / Plan / Recommendation CHL IP CLINICAL IMPRESSIONS 09/22/2015 Therapy Diagnosis Moderate pharyngeal phase dysphagia;Moderate oral phase dysphagia Clinical Impression Pt demonstrated poor oral formation of the bolus with pumping and continual spillage to the oropharynx with liquids pooling in the pyriforms before the swallow. As swallow is intiated thin and large nectar thick boluses penetrate the airway and in some cases are aspirated with sensation. Provided nectar thick liquids, regualting bolus size with small sips reduce risk. Pt should also be encouraged to swallow twice to clear oral residuals that spill tot he pharynx post swallow. Aspiration risk will persist as long as pt remains lethargic and less alert during PO intake. SLP will follow for tolerance.  Impact on safety and function Moderate aspiration risk   CHL IP TREATMENT RECOMMENDATION 09/22/2015 Treatment Recommendations Therapy as outlined in treatment plan below   Prognosis 09/22/2015 Prognosis for Safe Diet Advancement Fair Barriers to Reach Goals -- Barriers/Prognosis Comment -- CHL IP DIET RECOMMENDATION 09/22/2015 SLP Diet Recommendations Dysphagia 1 (Puree) solids;Nectar thick liquid Liquid Administration via Cup;No straw Medication Administration Whole meds with puree Compensations Slow rate;Small sips/bites;Multiple dry  swallows after each bite/sip Postural Changes Remain semi-upright after after feeds/meals (Comment);Seated upright at 90 degrees   CHL IP OTHER RECOMMENDATIONS 09/22/2015 Recommended Consults -- Oral Care Recommendations Oral care BID Other Recommendations  Order thickener from pharmacy   CHL IP FOLLOW UP RECOMMENDATIONS 09/22/2015 Follow up Recommendations Skilled Nursing facility   [facility] IP FREQUENCY AND DURATION 09/22/2015 Speech Therapy Frequency (ACUTE ONLY) min 2x/week Treatment Duration 2 weeks      CHL IP ORAL PHASE 09/22/2015 Oral Phase Impaired Oral - Pudding Teaspoon -- Oral - Pudding Cup -- Oral - Honey Teaspoon -- Oral - Honey Cup -- Oral - Nectar Teaspoon -- Oral - Nectar Cup Reduced posterior propulsion;Incomplete tongue to palate contact;Lingual/palatal residue;Delayed oral transit;Decreased bolus cohesion;Premature spillage Oral - Nectar Straw Reduced posterior propulsion;Incomplete tongue to palate contact;Lingual/palatal residue;Delayed oral transit;Decreased bolus cohesion;Premature spillage Oral - Thin Teaspoon -- Oral - Thin Cup Reduced posterior propulsion;Incomplete tongue to palate contact;Lingual/palatal residue;Delayed oral transit;Decreased bolus cohesion;Premature spillage Oral - Thin Straw -- Oral - Puree Reduced posterior propulsion;Incomplete tongue to palate contact;Lingual/palatal residue;Delayed oral transit;Decreased bolus cohesion;Premature spillage Oral - Mech Soft -- Oral - Regular Reduced posterior propulsion;Incomplete tongue to palate contact;Lingual/palatal residue;Delayed oral transit;Decreased bolus cohesion;Premature spillage Oral - Multi-Consistency -- Oral - Pill Reduced posterior propulsion;Incomplete tongue to palate contact;Lingual/palatal residue;Delayed oral transit;Decreased bolus cohesion;Premature spillage Oral Phase - Comment --  CHL IP PHARYNGEAL PHASE 09/22/2015 Pharyngeal Phase Impaired Pharyngeal- Pudding Teaspoon -- Pharyngeal -- Pharyngeal- Pudding Cup -- Pharyngeal  -- Pharyngeal- Honey Teaspoon -- Pharyngeal -- Pharyngeal- Honey Cup -- Pharyngeal -- Pharyngeal- Nectar Teaspoon -- Pharyngeal -- Pharyngeal- Nectar Cup Delayed swallow initiation-pyriform sinuses;Pharyngeal residue - valleculae;Pharyngeal residue - pyriform Pharyngeal -- Pharyngeal- Nectar Straw Delayed swallow initiation-pyriform sinuses;Pharyngeal residue - valleculae;Pharyngeal residue - pyriform;Penetration/Aspiration before swallow;Penetration/Aspiration during swallow;Trace aspiration Pharyngeal Material enters airway, passes BELOW cords then ejected out Pharyngeal- Thin Teaspoon Delayed swallow initiation-pyriform sinuses;Pharyngeal residue - valleculae;Pharyngeal residue - pyriform Pharyngeal -- Pharyngeal- Thin Cup Delayed swallow initiation-pyriform sinuses;Pharyngeal residue - valleculae;Pharyngeal residue - pyriform;Penetration/Aspiration before swallow;Penetration/Aspiration during swallow;Moderate aspiration Pharyngeal Material enters airway, passes BELOW cords and not ejected out despite cough attempt by patient Pharyngeal- Thin Straw -- Pharyngeal -- Pharyngeal- Puree Delayed swallow initiation-vallecula Pharyngeal -- Pharyngeal- Mechanical Soft -- Pharyngeal -- Pharyngeal- Regular Delayed swallow initiation-vallecula Pharyngeal -- Pharyngeal- Multi-consistency -- Pharyngeal -- Pharyngeal- Pill -- Pharyngeal -- Pharyngeal Comment --  No flowsheet data found. No flowsheet data found. [name], [place] [phone] Lynann Beaver 09/22/2015, 1:24 PM                LOS: 4 days   Oren Binet, MD  [facility] Pager:[phone]  If 7PM-7AM, please contact night-coverage [email or web address] Password [email or web address] 09/24/2015, 11:08 AM

## 2015-09-25 DIAGNOSIS — R1310 Dysphagia, unspecified: Secondary | ICD-10-CM

## 2015-09-25 DIAGNOSIS — E039 Hypothyroidism, unspecified: Secondary | ICD-10-CM

## 2015-09-25 LAB — BASIC METABOLIC PANEL
ANION GAP: 3 — AB (ref 5–15)
BUN: 26 mg/dL — ABNORMAL HIGH (ref 6–20)
BUN: 26 mg/dL — AB (ref 4–21)
CALCIUM: 8.1 mg/dL — AB (ref 8.9–10.3)
CHLORIDE: 114 mmol/L — AB (ref 101–111)
CO2: 26 mmol/L (ref 22–32)
Creatinine, Ser: 0.92 mg/dL (ref 0.61–1.24)
Creatinine: 0.9 mg/dL (ref 0.6–1.3)
GFR calc Af Amer: >60 mL/min (ref >60)
GFR calc non Af Amer: >60 mL/min (ref >60)
GLUCOSE: 117 mg/dL
GLUCOSE: 117 mg/dL — AB (ref 65–99)
Potassium: 4.4 mmol/L (ref 3.5–5.1)
SODIUM: 143 mmol/L (ref 137–147)
Sodium: 143 mmol/L (ref 135–145)

## 2015-09-25 LAB — CULTURE, BLOOD (ROUTINE X 2)
CULTURE: NO GROWTH
CULTURE: NO GROWTH

## 2015-09-25 NOTE — Progress Notes (Signed)
COURTESY NOTE:  Briefly visited patient; no family in room.  Appreciate your excellent care of [name]!  Please let me know if I can be of help. Am summarizing his breast cancer history below. He is clinically in remission and this history is not relevant to his current admission.   82 y.o. George Foster man with a left breast cancer, overlapping sites, status post biopsy 06/18/2015 for a clinical T2 N0, stage IA invasive ductal carcinoma, grade 2, estrogen and progesterone receptor positive, HER-2 nonamplified, with an MIB-1 of 60%.  (1) tamoxifen started 07/09/2015, to be continued for 5 years  (2) status post left mastectomy 08/20/2015 for a pT2 pNX, stage II invasive lobular carcinoma, grade 3, with negative margins, repeat HER-2 again negative, estrogen and progesterone receptor again positive.  (3) genetics testing Discussed 09/10/2015; the patient opted against genetics testing. His children and siblings will be tested through their own physicians

## 2015-09-25 NOTE — Progress Notes (Signed)
Speech Language Pathology Treatment: Dysphagia  Patient Details Name: SKYLER SCHRAMEL MRN: [id number] DOB: 1924/08/23 Today's Date: 09/25/2015 Time: [id number] SLP Time Calculation (min) (ACUTE ONLY): 33 min  Assessment / Plan / Recommendation Clinical Impression  Pt seen to educate family to findings and assess po tolerance.  Pt with poor awareness to his dysphagia stating he coughs when he eats but it is not a problem.  He demonstrated frustration stating "we are wasting time on this, people do not know how sick I am".  Wife denies pt having significant coughing with intake prior to admission however admits pt is in a SNF (? If eats with pt often).    Pt with baseline cough that makes differentiation difficult to determine source - aspiration/pna.  Pt observed with nectar thick soda, thin soda, graham cracker, peaches and pureed foods.  Intermittent cough observed during meal observation (over approximately 15 minutes) - approximately 20% of swallows/opportunities. Pt report intake poor due to medications impacting him, at times communication is off topic due to cognitive deficit.     Wife reports pt with drooling for a few months prior to admission - chronic dysphagia suspected.   Note Dr Ivor Reining discussion with wife regarding chronicity of aspiration risk.  SLP suspects pt's chronic dysphagia and recent surgery/deconditioning impacting tolerance.    Recommend   Advance to dys3/nectar with STRICT precautions.  SlP to follow up with pt/wife = RN made aware.    Pt underwent MBS and was placed on dys1/nectar diet.  Upon SLP entrance to room pt had a full cup of iced soda that was thin  - and spouse present reports pt choked on the drink earlier today.  RN made aware and SLP thickened pt's drinks.     HPI HPI: Patient is a 82 yo male who was brought here with cc of AMS, fever and cough. He is very confused and not a good historian. He was found hypoxic down to 88% on RA on admission PMHx: breast  cancer s/p mastectomy on tamoxifen, dementia, SDH, SI.  Pt underwent MBS and was placed on dys1/nectar diet.  Upon SLP entrance to room pt had a full cup of ice soda thin  - and spouse present reports pt choked on the drink earlier today.  RN made aware.        SLP Plan  Continue with current plan of care     Recommendations  Liquids provided via: Cup;Teaspoon;No straw Medication Administration: Crushed with puree Supervision: Full supervision/cueing for compensatory strategies Compensations: Minimize environmental distractions;Slow rate;Small sips/bites;Multiple dry swallows after each bite/sip Postural Changes and/or Swallow Maneuvers: Seated upright 90 degrees             Follow up Recommendations: Skilled Nursing facility Plan: Continue with current plan of care     [place], [place], [facility] SLP [phone]

## 2015-09-25 NOTE — Progress Notes (Signed)
PROGRESS NOTE        PATIENT DETAILS Name: George Foster Age: 82 y.o. Sex: male Date of Birth: Oct 04, 1924 Admit Date: 09/20/2015 Admitting Physician Gennaro Africa, MD [id number], Noah Delaine, MD  Brief Narrative: Patient is a 82 y.o. male with history of breast cancer status post left sided mastectomy on 5/31 admitted with acute encephalopathy and presumed aspiration pneumonia. Slowly improving with empiric antibiotic therapy and other supportive measures.  Subjective: Much more awake and alert this morning. Slowly improving but continues to appear weak and chronically sick appearing.   Assessment/Plan: Acute hypoxic respiratory failure secondary to aspiration pneumonia: Slowly Improving with empiric antibiotics. Blood cultures remain negative so far, continue with empiric Unasyn, suspect requires 1 more day of hospitalization. Evaluated by speech therapy, currently on a dysphagia 1 diet.  Acute metabolic encephalopathy: Resolved. Secondary to above, much improved with antibiotics treatment and other supportive measures. CT head negative for acute abnormalities.   Dysphagia: I suspect worsened following recent surgery-seen by speech therapy, underwent modified barium swallow-currently on a dysphagia 1 diet. Spoke with patient's spouse over the phone on 7/5, explained that patient continues to be at some risk of aspiration-she is understanding of the situation.  Anemia secondary to chronic disease: slight drop in hemoglobin secondary to IV fluid dilution. Follow periodically  Mild thrombocytopenia: Appears chronic, stable for outpatient follow-up with hematology/oncology  History of ER positive left breast cancer-status post left mastectomy 08/20/15: Reviewed most recent outpatient oncology note, plans are to continue tamoxifen for at least 5 years and perform yearly mammographies.  Hypothyroidism: Continue Synthroid  BPH: Continue Proscar and Flomax.  Depression:  Cautiously continue with Abilify and Remeron.  Suspected history of dementia: Resume home medications once encephalopathy improves further.  Chronic pain syndrome: On methadone at half his usual regimen-suspect contributing to encephalopathy-but unable to stop due to risk of withdrawal symptoms.  DVT Prophylaxis: Prophylactic Lovenox  Code Status: DNR  Family Communication: Spoke with spouse over phone on 7/5, none at bedside this morning.  Disposition Plan: Remain inpatient- SNF on discharge 7/7   Antimicrobial agents: IV Unasyn 7/4>> IV vancomycin 7/1>> 7/5 IV Zosyn 7/1>> 7/5  Procedures: None  CONSULTS:  None  Time spent: 25 minutes-Greater than 50% of this time was spent in counseling, explanation of diagnosis, planning of further management, and coordination of care.  MEDICATIONS: Anti-infectives    Start     Dose/Rate Route Frequency Ordered Stop   09/23/15 1200  Ampicillin-Sulbactam (UNASYN) 3 g in sodium chloride 0.9 % 100 mL IVPB     3 g 100 mL/hr over 60 Minutes Intravenous Every 8 hours 09/23/15 1116     09/22/15 1100  vancomycin (VANCOCIN) IVPB 750 mg/150 ml premix  Status:  Discontinued     750 mg 150 mL/hr over 60 Minutes Intravenous Every 12 hours 09/22/15 1030 09/23/15 1110   09/21/15 1700  vancomycin (VANCOCIN) IVPB 1000 mg/200 mL premix  Status:  Discontinued     1,000 mg 200 mL/hr over 60 Minutes Intravenous Every 24 hours 09/20/15 1626 09/22/15 1029   09/21/15 0200  piperacillin-tazobactam (ZOSYN) IVPB 3.375 g  Status:  Discontinued     3.375 g 12.5 mL/hr over 240 Minutes Intravenous Every 8 hours 09/20/15 1626 09/23/15 1110   09/20/15 1545  vancomycin (VANCOCIN) IVPB 1000 mg/200 mL premix     1,000 mg 200 mL/hr over  60 Minutes Intravenous  Once 09/20/15 1541 09/20/15 1747   09/20/15 1545  piperacillin-tazobactam (ZOSYN) IVPB 3.375 g     3.375 g 100 mL/hr over 30 Minutes Intravenous  Once 09/20/15 1541 09/20/15 1717      Scheduled Meds: .  ampicillin-sulbactam (UNASYN) IV  3 g Intravenous Q8H  . antiseptic oral rinse  7 mL Mouth Rinse BID  . ARIPiprazole  15 mg Oral Daily  . enoxaparin (LOVENOX) injection  40 mg Subcutaneous Q24H  . finasteride  5 mg Oral QHS  . gabapentin  600 mg Oral TID  . levothyroxine  88 mcg Oral QAC breakfast  . linaclotide  290 mcg Oral QAC breakfast  . methadone  5 mg Oral Q12H  . pantoprazole  40 mg Oral QAC breakfast  . polyethylene glycol  17 g Oral QHS  . senna-docusate  2 tablet Oral QHS  . tamoxifen  20 mg Oral Daily  . tamsulosin  0.4 mg Oral QHS  . cyanocobalamin  1,000 mcg Oral Daily   Continuous Infusions:  PRN Meds:.albuterol, haloperidol **OR** [DISCONTINUED] haloperidol lactate, RESOURCE THICKENUP CLEAR   PHYSICAL EXAM: Vital signs: Filed Vitals:   09/24/15 0358 09/24/15 1434 09/24/15 2105 09/25/15 0617  BP: 121/63 120/51 122/51 119/53  Pulse: 54 61 62 56  Temp: 98.3 F (36.8 C) 98.7 F (37.1 C) 99.1 F (37.3 C) 98.1 F (36.7 C)  TempSrc: Axillary Axillary Axillary Axillary  Resp: 18 18 16 16   Height:      Weight:      SpO2: 94% 93% 93% 93%   Filed Weights   09/20/15 1754  Weight: 82.3 kg (181 lb 7 oz)   Body mass index is 27.59 kg/(m^2).   Gen Exam:Awake and alert compared to yesterday. Looks chronically sick but not in any acute distress.  Neck: Supple, No JVD.   Chest: Some transmitted upper airway sounds, bibasilar rales but otherwise clear. CVS: S1 S2 Regular, no murmurs.  Abdomen: soft, BS +, non tender, non distended.  Extremities: no edema, lower extremities warm to touch. Neurologic: Non Focal-but with generalized weakness  Skin: No Rash or lesions   Wounds: N/A.    LABORATORY DATA: CBC:  Recent Labs Lab 09/20/15 1451 09/21/15 0515 09/22/15 0457  WBC 9.7 7.0 5.3  NEUTROABS 8.5*  --   --   HGB 11.9* 11.5* 9.3*  HCT 34.8* 34.2* 27.4*  MCV 99.1 102.4* 101.9*  PLT 140* 138* 119*    Basic Metabolic Panel:  Recent Labs Lab 09/20/15 1451  09/21/15 0515 09/22/15 0457 09/25/15 0428  NA 140 142 138 143  K 4.3 4.1 4.1 4.4  CL 109 113* 109 114*  CO2 23 22 23 26   GLUCOSE 146* 100* 104* 117*  BUN 32* 29* 27* 26*  CREATININE 1.57* 1.23 1.19 0.92  CALCIUM 8.3* 8.1* 7.5* 8.1*    GFR: Estimated Creatinine Clearance: 57 mL/min (by C-G formula based on Cr of 0.92).  Liver Function Tests:  Recent Labs Lab 09/20/15 1451 09/21/15 0515  AST 39 46*  ALT 17 17  ALKPHOS 45 43  BILITOT 1.2 0.8  PROT 6.2* 5.8*  ALBUMIN 3.6 3.1*   No results for input(s): LIPASE, AMYLASE in the last 168 hours. No results for input(s): AMMONIA in the last 168 hours.  Coagulation Profile: No results for input(s): INR, PROTIME in the last 168 hours.  Cardiac Enzymes: No results for input(s): CKTOTAL, CKMB, CKMBINDEX, TROPONINI in the last 168 hours.  BNP (last 3 results) No results for input(s):  PROBNP in the last 8760 hours.  HbA1C: No results for input(s): HGBA1C in the last 72 hours.  CBG: No results for input(s): GLUCAP in the last 168 hours.  Lipid Profile: No results for input(s): CHOL, HDL, LDLCALC, TRIG, CHOLHDL, LDLDIRECT in the last 72 hours.  Thyroid Function Tests: No results for input(s): TSH, T4TOTAL, FREET4, T3FREE, THYROIDAB in the last 72 hours.  Anemia Panel: No results for input(s): VITAMINB12, FOLATE, FERRITIN, TIBC, IRON, RETICCTPCT in the last 72 hours.  Urine analysis:    Component Value Date/Time   COLORURINE AMBER* 09/21/2015 0504   APPEARANCEUR CLOUDY* 09/21/2015 0504   LABSPEC 1.019 09/21/2015 0504   PHURINE 5.0 09/21/2015 0504   GLUCOSEU NEGATIVE 09/21/2015 0504   HGBUR NEGATIVE 09/21/2015 0504   BILIRUBINUR NEGATIVE 09/21/2015 0504   KETONESUR NEGATIVE 09/21/2015 0504   PROTEINUR NEGATIVE 09/21/2015 0504   UROBILINOGEN 1.0 10/30/2014 1639   NITRITE NEGATIVE 09/21/2015 0504   LEUKOCYTESUR NEGATIVE 09/21/2015 0504    Sepsis Labs: Lactic Acid, Venous    Component Value Date/Time    LATICACIDVEN 1.3 09/20/2015 2027    MICROBIOLOGY: Recent Results (from the past 240 hour(s))  Blood culture (routine x 2)     Status: None (Preliminary result)   Collection Time: 09/20/15  2:51 PM  Result Value Ref Range Status   Specimen Description BLOOD RIGHT ANTECUBITAL  Final   Special Requests BOTTLES DRAWN AEROBIC AND ANAEROBIC 5 CC EACH  Final   Culture   Final    NO GROWTH 4 DAYS Performed at [facility]    Report Status PENDING  Incomplete  Blood culture (routine x 2)     Status: None (Preliminary result)   Collection Time: 09/20/15  4:35 PM  Result Value Ref Range Status   Specimen Description BLOOD RIGHT ARM  Final   Special Requests BOTTLES DRAWN AEROBIC AND ANAEROBIC 5CC  Final   Culture   Final    NO GROWTH 4 DAYS Performed at [facility]    Report Status PENDING  Incomplete  Urine culture     Status: Abnormal   Collection Time: 09/21/15  5:03 AM  Result Value Ref Range Status   Specimen Description URINE, CLEAN CATCH  Final   Special Requests NONE  Final   Culture (A)  Final    4,000 COLONIES/mL INSIGNIFICANT GROWTH Performed at [facility]    Report Status 09/22/2015 FINAL  Final    RADIOLOGY STUDIES/RESULTS: Dg Chest 2 View  09/20/2015  CLINICAL DATA:  Fever and cough EXAM: CHEST  2 VIEW COMPARISON:  08/20/2015 FINDINGS: Cardiac shadow is within normal limits. Increased density is noted in the right lung base projecting in the right lower lobe consistent with acute pneumonia. The left lung is clear. No bony abnormality is noted. IMPRESSION: Right lower lobe pneumonia. Followup PA and lateral chest X-ray is recommended in 3-4 weeks following trial of antibiotic therapy to ensure resolution and exclude underlying malignancy. Electronically Signed   By: Inez Catalina M.D.   On: 09/20/2015 15:18   Ct Head Wo Contrast  09/20/2015  CLINICAL DATA:  Patient noted to be much less active than normal with low-grade fever. EXAM: CT HEAD WITHOUT  CONTRAST TECHNIQUE: Contiguous axial images were obtained from the base of the skull through the vertex without intravenous contrast. COMPARISON:  10/24/2014 FINDINGS: The ventricles and cisterns are within normal. There is minimal prominence of the sulcal spaces compatible with age related atrophic change. There is mild chronic ischemic microvascular disease. There  is no mass, mass effect, shift of midline structures or acute hemorrhage. There is no evidence of acute infarction. Remaining bones and soft tissues are within normal. IMPRESSION: No acute intracranial findings. Chronic ischemic microvascular disease and mild age related atrophic change. Electronically Signed   By: Marin Olp M.D.   On: 09/20/2015 17:19   Dg Swallowing Func-speech Pathology  09/22/2015  Objective Swallowing Evaluation: Type of Study: MBS-Modified Barium Swallow Study Patient Details Name: George Foster MRN: [id number] Date of Birth: [birth year] Today's Date: 09/22/2015 Time: SLP Start Time (ACUTE ONLY): 1230-SLP Stop Time (ACUTE ONLY): 1250 SLP Time Calculation (min) (ACUTE ONLY): 20 min Past Medical History: Past Medical History Diagnosis Date . Dementia  . Hypertension  . Arthritis  . Hypothyroidism  . Chronic back pain  . Anxiety disorder  . Gastroesophageal reflux disease  . Polypharmacy    History of hospitalizations for altered mental status related to medications . Chronic constipation    with fecal impactions, recurrent . Suicidal ideation 06/06/2011 . Traumatic subdural hematoma ([place]) 10/01/2013 . Multiple facial fractures ([place]) 10/02/2013 . Basilar skull fracture ([place]) 10/02/2013 . Acute blood loss anemia 10/02/2013 . Neuropathy ([place])  . Depression  . Mood disorder ([place])  . BPH (benign prostatic hyperplasia)  . Tremor  . Osteoarthritis  . Anxiety  . UTI (lower urinary tract infection)  . Cancer of left breast [facility])  Past Surgical History: Past Surgical History Procedure Laterality Date . Appendectomy   . Multiple tooth extractions      Removal of Nonrestorable teeth numbers 2, 4, 6, 12, 13, 18, 29 . Skin surgery     Cancer cells removed . Prostate surgery     removed x2 . Eye surgery   . Mastectomy  08/20/2015 . Total mastectomy Left 08/20/2015   Procedure: LEFT TOTAL MASTECTOMY;  Surgeon: Fanny Skates, MD;  Location: [facility];  Service: General;  Laterality: Left; HPI: Patient is a 82 yo male who was brought here with cc of AMS, fever and cough. He is very confused and not a good historian. He was found hypoxic down to 88% on RA on admission PMHx: breast cancer s/p mastectomy on tamoxifen, dementia, SDH, SI Subjective: pt mostly keeps his eyes closed but participates in PO trials Assessment / Plan / Recommendation CHL IP CLINICAL IMPRESSIONS 09/22/2015 Therapy Diagnosis Moderate pharyngeal phase dysphagia;Moderate oral phase dysphagia Clinical Impression Pt demonstrated poor oral formation of the bolus with pumping and continual spillage to the oropharynx with liquids pooling in the pyriforms before the swallow. As swallow is intiated thin and large nectar thick boluses penetrate the airway and in some cases are aspirated with sensation. Provided nectar thick liquids, regualting bolus size with small sips reduce risk. Pt should also be encouraged to swallow twice to clear oral residuals that spill tot he pharynx post swallow. Aspiration risk will persist as long as pt remains lethargic and less alert during PO intake. SLP will follow for tolerance.  Impact on safety and function Moderate aspiration risk   CHL IP TREATMENT RECOMMENDATION 09/22/2015 Treatment Recommendations Therapy as outlined in treatment plan below   Prognosis 09/22/2015 Prognosis for Safe Diet Advancement Fair Barriers to Reach Goals -- Barriers/Prognosis Comment -- CHL IP DIET RECOMMENDATION 09/22/2015 SLP Diet Recommendations Dysphagia 1 (Puree) solids;Nectar thick liquid Liquid Administration via Cup;No straw Medication Administration Whole meds with puree Compensations Slow  rate;Small sips/bites;Multiple dry swallows after each bite/sip Postural Changes Remain semi-upright after after feeds/meals (Comment);Seated upright at 90 degrees   CHL IP OTHER  RECOMMENDATIONS 09/22/2015 Recommended Consults -- Oral Care Recommendations Oral care BID Other Recommendations Order thickener from pharmacy   CHL IP FOLLOW UP RECOMMENDATIONS 09/22/2015 Follow up Recommendations Skilled Nursing facility   [facility] IP FREQUENCY AND DURATION 09/22/2015 Speech Therapy Frequency (ACUTE ONLY) min 2x/week Treatment Duration 2 weeks      CHL IP ORAL PHASE 09/22/2015 Oral Phase Impaired Oral - Pudding Teaspoon -- Oral - Pudding Cup -- Oral - Honey Teaspoon -- Oral - Honey Cup -- Oral - Nectar Teaspoon -- Oral - Nectar Cup Reduced posterior propulsion;Incomplete tongue to palate contact;Lingual/palatal residue;Delayed oral transit;Decreased bolus cohesion;Premature spillage Oral - Nectar Straw Reduced posterior propulsion;Incomplete tongue to palate contact;Lingual/palatal residue;Delayed oral transit;Decreased bolus cohesion;Premature spillage Oral - Thin Teaspoon -- Oral - Thin Cup Reduced posterior propulsion;Incomplete tongue to palate contact;Lingual/palatal residue;Delayed oral transit;Decreased bolus cohesion;Premature spillage Oral - Thin Straw -- Oral - Puree Reduced posterior propulsion;Incomplete tongue to palate contact;Lingual/palatal residue;Delayed oral transit;Decreased bolus cohesion;Premature spillage Oral - Mech Soft -- Oral - Regular Reduced posterior propulsion;Incomplete tongue to palate contact;Lingual/palatal residue;Delayed oral transit;Decreased bolus cohesion;Premature spillage Oral - Multi-Consistency -- Oral - Pill Reduced posterior propulsion;Incomplete tongue to palate contact;Lingual/palatal residue;Delayed oral transit;Decreased bolus cohesion;Premature spillage Oral Phase - Comment --  CHL IP PHARYNGEAL PHASE 09/22/2015 Pharyngeal Phase Impaired Pharyngeal- Pudding Teaspoon -- Pharyngeal --  Pharyngeal- Pudding Cup -- Pharyngeal -- Pharyngeal- Honey Teaspoon -- Pharyngeal -- Pharyngeal- Honey Cup -- Pharyngeal -- Pharyngeal- Nectar Teaspoon -- Pharyngeal -- Pharyngeal- Nectar Cup Delayed swallow initiation-pyriform sinuses;Pharyngeal residue - valleculae;Pharyngeal residue - pyriform Pharyngeal -- Pharyngeal- Nectar Straw Delayed swallow initiation-pyriform sinuses;Pharyngeal residue - valleculae;Pharyngeal residue - pyriform;Penetration/Aspiration before swallow;Penetration/Aspiration during swallow;Trace aspiration Pharyngeal Material enters airway, passes BELOW cords then ejected out Pharyngeal- Thin Teaspoon Delayed swallow initiation-pyriform sinuses;Pharyngeal residue - valleculae;Pharyngeal residue - pyriform Pharyngeal -- Pharyngeal- Thin Cup Delayed swallow initiation-pyriform sinuses;Pharyngeal residue - valleculae;Pharyngeal residue - pyriform;Penetration/Aspiration before swallow;Penetration/Aspiration during swallow;Moderate aspiration Pharyngeal Material enters airway, passes BELOW cords and not ejected out despite cough attempt by patient Pharyngeal- Thin Straw -- Pharyngeal -- Pharyngeal- Puree Delayed swallow initiation-vallecula Pharyngeal -- Pharyngeal- Mechanical Soft -- Pharyngeal -- Pharyngeal- Regular Delayed swallow initiation-vallecula Pharyngeal -- Pharyngeal- Multi-consistency -- Pharyngeal -- Pharyngeal- Pill -- Pharyngeal -- Pharyngeal Comment --  No flowsheet data found. No flowsheet data found. [name], [place] [phone] Lynann Beaver 09/22/2015, 1:24 PM                LOS: 5 days   Oren Binet, MD  [facility] Pager:[phone]  If 7PM-7AM, please contact night-coverage [email or web address] Password [email or web address] 09/25/2015, 10:35 AM

## 2015-09-25 NOTE — Progress Notes (Signed)
Physical Therapy Treatment Patient Details Name: George Foster MRN: [id number] DOB: 1925-01-16 Today's Date: 09/25/2015    History of Present Illness Patient is a 82 yo male   who was brought here with cc of AMS, fever and cough. He is very confused and not a good historian. He was found hypoxic down to 88% on RA on admission PMHx: breast cancer s/p mastectomy on tamoxifen, dementia, SDH, SI    PT Comments    Pt more alert with each day.  Assisted OOB to [facility] as a precaution as he is incont.  Assisted with amb + 2 assist using B Platform EVA walker for increased support and to increase amb distance.  Very narrow gait with hips and knees flex. Unsteady.  HIGH FALL RISK.  Follow Up Recommendations  SNF     Equipment Recommendations       Recommendations for Other Services       Precautions / Restrictions Precautions Precautions: Fall Restrictions Weight Bearing Restrictions: No    Mobility  Bed Mobility Overal bed mobility: Needs Assistance Bed Mobility: Supine to Sit     Supine to sit: Max assist;+2 for physical assistance;+2 for safety/equipment     General bed mobility comments: repeat instruction to increase self initiating and increased time to prcess direction.  assisted to EOB utilizing bed pad to complete self scooting.  demonstartes increased ability to sit EOB at Supervision level.    Transfers Overall transfer level: Needs assistance Equipment used: None Transfers: Sit to/from Stand Sit to Stand: Max assist;+2 physical assistance;+2 safety/equipment         General transfer comment: + 2 assist rise from elevated bed to [facility] then to amb    Ambulation/Gait Ambulation/Gait assistance: Max assist;+2 physical assistance;+2 safety/equipment Ambulation Distance (Feet): 86 Feet Assistive device: Bilateral platform walker (EVA walker)   Gait velocity: decreased   General Gait Details: used B platform EVA walker for increased support.  Pt tolerated amb an  increased distance.     Stairs            Wheelchair Mobility    Modified Rankin (Stroke Patients Only)       Balance                                    Cognition Arousal/Alertness: Awake/alert Behavior During Therapy: WFL for tasks assessed/performed                        Exercises      General Comments        Pertinent Vitals/Pain Pain Assessment: No/denies pain Faces Pain Scale: Hurts a little bit Pain Intervention(s): Limited activity within patient's tolerance;Monitored during session;Other (comment) (pt reports rn providing medicine she can but is limited by the state perpt)    Home Living                      Prior Function            PT Goals (current goals can now be found in the care plan section) Progress towards PT goals: Progressing toward goals    Frequency  Min 3X/week    PT Plan Current plan remains appropriate    Co-evaluation             End of Session Equipment Utilized During Treatment: Gait belt Activity Tolerance: Patient limited by fatigue Patient left:  in chair;with call bell/phone within reach;with chair alarm set     Time: [phone] PT Time Calculation (min) (ACUTE ONLY): 26 min  Charges:  $Gait Training: 8-22 mins $Therapeutic Activity: 8-22 mins                    G Codes:      Rica Koyanagi  PTA [facility]  Acute  Rehab Pager      [phone]

## 2015-09-26 ENCOUNTER — Telehealth: Payer: Self-pay | Admitting: Internal Medicine

## 2015-09-26 DIAGNOSIS — J189 Pneumonia, unspecified organism: Secondary | ICD-10-CM

## 2015-09-26 DIAGNOSIS — F0391 Unspecified dementia with behavioral disturbance: Secondary | ICD-10-CM

## 2015-09-26 MED ORDER — METHADONE HCL 5 MG PO TABS: ORAL_TABLET | ORAL | Status: DC

## 2015-09-26 NOTE — Discharge Summary (Signed)
PATIENT DETAILS Name: George Foster Age: 82 y.o. Sex: male Date of Birth: Aug 29, 1924 MRN: [id number]. Admitting Physician: Gennaro Africa, MD [id number], Noah Delaine, MD  Admit Date: 09/20/2015 Discharge date: 09/26/2015  Recommendations for Outpatient Follow-up:  1. Please repeat chest x-ray in 3-4 weeks to document resolution of pneumonia.  2. Please ensure follow-up with speech therapy while at SNF.  recommendations for PCP to follow-up on, etc.) 3. Please repeat CBC/BMET in 1 week 4. Suggest that we slowly taper down methadone further-minimize use of sedating and narcotic medications to reduce the risk of aspiration in the future  PRIMARY DISCHARGE DIAGNOSIS:  Active Problems:   HCAP (healthcare-associated pneumonia)   Altered mental state   Pressure ulcer      PAST MEDICAL HISTORY: Past Medical History  Diagnosis Date  . Dementia   . Hypertension   . Arthritis   . Hypothyroidism   . Chronic back pain   . Anxiety disorder   . Gastroesophageal reflux disease   . Polypharmacy     History of hospitalizations for altered mental status related to medications  . Chronic constipation     with fecal impactions, recurrent  . Suicidal ideation 06/06/2011  . Traumatic subdural hematoma ([place]) 10/01/2013  . Multiple facial fractures ([place]) 10/02/2013  . Basilar skull fracture ([place]) 10/02/2013  . Acute blood loss anemia 10/02/2013  . Neuropathy ([place])   . Depression   . Mood disorder ([place])   . BPH (benign prostatic hyperplasia)   . Tremor   . Osteoarthritis   . Anxiety   . UTI (lower urinary tract infection)   . Cancer of left breast ([place])     DISCHARGE MEDICATIONS: Current Discharge Medication List    CONTINUE these medications which have CHANGED   Details  methadone (DOLOPHINE) 5 MG tablet Take one tablet by mouth every 12 hours for pain. Please do not give if sedated or lethargic Qty: 10 tablet, Refills: 0   Associated Diagnoses: Chronic back pain      CONTINUE these  medications which have NOT CHANGED   Details  ARIPiprazole (ABILIFY) 15 MG tablet Take 15 mg by mouth daily.    cyanocobalamin 1000 MCG tablet Take 1,000 mcg by mouth daily.     finasteride (PROSCAR) 5 MG tablet Take 5 mg by mouth daily.    gabapentin (NEURONTIN) 600 MG tablet Take 600 mg by mouth 3 (three) times daily.     levothyroxine (SYNTHROID, LEVOTHROID) 88 MCG tablet Take 88 mcg by mouth daily before breakfast.     Linaclotide (LINZESS) 290 MCG CAPS capsule Take 290 mcg by mouth every morning.     mirtazapine (REMERON) 15 MG tablet Take 15 mg by mouth at bedtime.    Multiple Vitamins-Minerals (MULTIVITAMINS THER. W/MINERALS) TABS Take 1 tablet by mouth daily.    omeprazole (PRILOSEC) 40 MG capsule Take 40 mg by mouth daily.    polyethylene glycol (MIRALAX / GLYCOLAX) packet Take 17 g by mouth daily.    Rivastigmine 13.3 MG/24HR PT24 Place 1 patch onto the skin every morning.    sennosides-docusate sodium (SENOKOT-S) 8.6-50 MG tablet Take 2 tablets by mouth at bedtime.    tamoxifen (NOLVADEX) 20 MG tablet Take 20 mg by mouth daily.    tamsulosin (FLOMAX) 0.4 MG CAPS Take 0.4 mg by mouth at bedtime.         ALLERGIES:   Allergies  Allergen Reactions  . Cogentin [Benztropine] Other (See Comments)    unkown    BRIEF HPI:  See H&P, Labs, Consult and Test reports for all details in brief, patient Patient is a 82 y.o. male with history of breast cancer status post left sided mastectomy on 5/31 admitted with acute encephalopathy and presumed aspiration pneumonia  CONSULTATIONS:   None  PERTINENT RADIOLOGIC STUDIES: Dg Chest 2 View  09/20/2015  CLINICAL DATA:  Fever and cough EXAM: CHEST  2 VIEW COMPARISON:  08/20/2015 FINDINGS: Cardiac shadow is within normal limits. Increased density is noted in the right lung base projecting in the right lower lobe consistent with acute pneumonia. The left lung is clear. No bony abnormality is noted. IMPRESSION: Right lower lobe  pneumonia. Followup PA and lateral chest X-ray is recommended in 3-4 weeks following trial of antibiotic therapy to ensure resolution and exclude underlying malignancy. Electronically Signed   By: Inez Catalina M.D.   On: 09/20/2015 15:18   Ct Head Wo Contrast  09/20/2015  CLINICAL DATA:  Patient noted to be much less active than normal with low-grade fever. EXAM: CT HEAD WITHOUT CONTRAST TECHNIQUE: Contiguous axial images were obtained from the base of the skull through the vertex without intravenous contrast. COMPARISON:  10/24/2014 FINDINGS: The ventricles and cisterns are within normal. There is minimal prominence of the sulcal spaces compatible with age related atrophic change. There is mild chronic ischemic microvascular disease. There is no mass, mass effect, shift of midline structures or acute hemorrhage. There is no evidence of acute infarction. Remaining bones and soft tissues are within normal. IMPRESSION: No acute intracranial findings. Chronic ischemic microvascular disease and mild age related atrophic change. Electronically Signed   By: Marin Olp M.D.   On: 09/20/2015 17:19   Dg Swallowing Func-speech Pathology  09/22/2015  Objective Swallowing Evaluation: Type of Study: MBS-Modified Barium Swallow Study Patient Details Name: George Foster MRN: [id number] Date of Birth: 13-Nov-1924 Today's Date: 09/22/2015 Time: SLP Start Time (ACUTE ONLY): 1230-SLP Stop Time (ACUTE ONLY): 1250 SLP Time Calculation (min) (ACUTE ONLY): 20 min Past Medical History: Past Medical History Diagnosis Date . Dementia  . Hypertension  . Arthritis  . Hypothyroidism  . Chronic back pain  . Anxiety disorder  . Gastroesophageal reflux disease  . Polypharmacy    History of hospitalizations for altered mental status related to medications . Chronic constipation    with fecal impactions, recurrent . Suicidal ideation 06/06/2011 . Traumatic subdural hematoma ([place]) 10/01/2013 . Multiple facial fractures ([place]) 10/02/2013 . Basilar skull  fracture ([place]) 10/02/2013 . Acute blood loss anemia 10/02/2013 . Neuropathy ([place])  . Depression  . Mood disorder ([place])  . BPH (benign prostatic hyperplasia)  . Tremor  . Osteoarthritis  . Anxiety  . UTI (lower urinary tract infection)  . Cancer of left breast [facility])  Past Surgical History: Past Surgical History Procedure Laterality Date . Appendectomy   . Multiple tooth extractions     Removal of Nonrestorable teeth numbers 2, 4, 6, 12, 13, 18, 29 . Skin surgery     Cancer cells removed . Prostate surgery     removed x2 . Eye surgery   . Mastectomy  08/20/2015 . Total mastectomy Left 08/20/2015   Procedure: LEFT TOTAL MASTECTOMY;  Surgeon: Fanny Skates, MD;  Location: [facility];  Service: General;  Laterality: Left; HPI: Patient is a 82 yo male who was brought here with cc of AMS, fever and cough. He is very confused and not a good historian. He was found hypoxic down to 88% on RA on admission PMHx: breast cancer s/p mastectomy on tamoxifen, dementia,  SDH, SI Subjective: pt mostly keeps his eyes closed but participates in PO trials Assessment / Plan / Recommendation CHL IP CLINICAL IMPRESSIONS 09/22/2015 Therapy Diagnosis Moderate pharyngeal phase dysphagia;Moderate oral phase dysphagia Clinical Impression Pt demonstrated poor oral formation of the bolus with pumping and continual spillage to the oropharynx with liquids pooling in the pyriforms before the swallow. As swallow is intiated thin and large nectar thick boluses penetrate the airway and in some cases are aspirated with sensation. Provided nectar thick liquids, regualting bolus size with small sips reduce risk. Pt should also be encouraged to swallow twice to clear oral residuals that spill tot he pharynx post swallow. Aspiration risk will persist as long as pt remains lethargic and less alert during PO intake. SLP will follow for tolerance.  Impact on safety and function Moderate aspiration risk   CHL IP TREATMENT RECOMMENDATION 09/22/2015 Treatment  Recommendations Therapy as outlined in treatment plan below   Prognosis 09/22/2015 Prognosis for Safe Diet Advancement Fair Barriers to Reach Goals -- Barriers/Prognosis Comment -- CHL IP DIET RECOMMENDATION 09/22/2015 SLP Diet Recommendations Dysphagia 1 (Puree) solids;Nectar thick liquid Liquid Administration via Cup;No straw Medication Administration Whole meds with puree Compensations Slow rate;Small sips/bites;Multiple dry swallows after each bite/sip Postural Changes Remain semi-upright after after feeds/meals (Comment);Seated upright at 90 degrees   CHL IP OTHER RECOMMENDATIONS 09/22/2015 Recommended Consults -- Oral Care Recommendations Oral care BID Other Recommendations Order thickener from pharmacy   CHL IP FOLLOW UP RECOMMENDATIONS 09/22/2015 Follow up Recommendations Skilled Nursing facility   [facility] IP FREQUENCY AND DURATION 09/22/2015 Speech Therapy Frequency (ACUTE ONLY) min 2x/week Treatment Duration 2 weeks      CHL IP ORAL PHASE 09/22/2015 Oral Phase Impaired Oral - Pudding Teaspoon -- Oral - Pudding Cup -- Oral - Honey Teaspoon -- Oral - Honey Cup -- Oral - Nectar Teaspoon -- Oral - Nectar Cup Reduced posterior propulsion;Incomplete tongue to palate contact;Lingual/palatal residue;Delayed oral transit;Decreased bolus cohesion;Premature spillage Oral - Nectar Straw Reduced posterior propulsion;Incomplete tongue to palate contact;Lingual/palatal residue;Delayed oral transit;Decreased bolus cohesion;Premature spillage Oral - Thin Teaspoon -- Oral - Thin Cup Reduced posterior propulsion;Incomplete tongue to palate contact;Lingual/palatal residue;Delayed oral transit;Decreased bolus cohesion;Premature spillage Oral - Thin Straw -- Oral - Puree Reduced posterior propulsion;Incomplete tongue to palate contact;Lingual/palatal residue;Delayed oral transit;Decreased bolus cohesion;Premature spillage Oral - Mech Soft -- Oral - Regular Reduced posterior propulsion;Incomplete tongue to palate contact;Lingual/palatal  residue;Delayed oral transit;Decreased bolus cohesion;Premature spillage Oral - Multi-Consistency -- Oral - Pill Reduced posterior propulsion;Incomplete tongue to palate contact;Lingual/palatal residue;Delayed oral transit;Decreased bolus cohesion;Premature spillage Oral Phase - Comment --  CHL IP PHARYNGEAL PHASE 09/22/2015 Pharyngeal Phase Impaired Pharyngeal- Pudding Teaspoon -- Pharyngeal -- Pharyngeal- Pudding Cup -- Pharyngeal -- Pharyngeal- Honey Teaspoon -- Pharyngeal -- Pharyngeal- Honey Cup -- Pharyngeal -- Pharyngeal- Nectar Teaspoon -- Pharyngeal -- Pharyngeal- Nectar Cup Delayed swallow initiation-pyriform sinuses;Pharyngeal residue - valleculae;Pharyngeal residue - pyriform Pharyngeal -- Pharyngeal- Nectar Straw Delayed swallow initiation-pyriform sinuses;Pharyngeal residue - valleculae;Pharyngeal residue - pyriform;Penetration/Aspiration before swallow;Penetration/Aspiration during swallow;Trace aspiration Pharyngeal Material enters airway, passes BELOW cords then ejected out Pharyngeal- Thin Teaspoon Delayed swallow initiation-pyriform sinuses;Pharyngeal residue - valleculae;Pharyngeal residue - pyriform Pharyngeal -- Pharyngeal- Thin Cup Delayed swallow initiation-pyriform sinuses;Pharyngeal residue - valleculae;Pharyngeal residue - pyriform;Penetration/Aspiration before swallow;Penetration/Aspiration during swallow;Moderate aspiration Pharyngeal Material enters airway, passes BELOW cords and not ejected out despite cough attempt by patient Pharyngeal- Thin Straw -- Pharyngeal -- Pharyngeal- Puree Delayed swallow initiation-vallecula Pharyngeal -- Pharyngeal- Mechanical Soft -- Pharyngeal -- Pharyngeal- Regular Delayed swallow initiation-vallecula Pharyngeal -- Pharyngeal- Multi-consistency --  Pharyngeal -- Pharyngeal- Pill -- Pharyngeal -- Pharyngeal Comment --  No flowsheet data found. No flowsheet data found. [name], MA CCC-SLP [phone] [place], Katherene Ponto 09/22/2015, 1:24 PM                  PERTINENT LAB RESULTS: CBC: No results for input(s): WBC, HGB, HCT, PLT in the last 72 hours. CMET CMP     Component Value Date/Time   NA 143 09/25/2015 0428   NA 144 03/25/2015   K 4.4 09/25/2015 0428   CL 114* 09/25/2015 0428   CO2 26 09/25/2015 0428   GLUCOSE 117* 09/25/2015 0428   BUN 26* 09/25/2015 0428   BUN 24* 03/25/2015   CREATININE 0.92 09/25/2015 0428   CREATININE 1.1 03/25/2015   CALCIUM 8.1* 09/25/2015 0428   PROT 5.8* 09/21/2015 0515   ALBUMIN 3.1* 09/21/2015 0515   AST 46* 09/21/2015 0515   ALT 17 09/21/2015 0515   ALKPHOS 43 09/21/2015 0515   BILITOT 0.8 09/21/2015 0515   GFRNONAA >60 09/25/2015 0428   GFRAA >60 09/25/2015 0428    GFR Estimated Creatinine Clearance: 57 mL/min (by C-G formula based on Cr of 0.92). No results for input(s): LIPASE, AMYLASE in the last 72 hours. No results for input(s): CKTOTAL, CKMB, CKMBINDEX, TROPONINI in the last 72 hours. Invalid input(s): POCBNP No results for input(s): DDIMER in the last 72 hours. No results for input(s): HGBA1C in the last 72 hours. No results for input(s): CHOL, HDL, LDLCALC, TRIG, CHOLHDL, LDLDIRECT in the last 72 hours. No results for input(s): TSH, T4TOTAL, T3FREE, THYROIDAB in the last 72 hours.  Invalid input(s): FREET3 No results for input(s): VITAMINB12, FOLATE, FERRITIN, TIBC, IRON, RETICCTPCT in the last 72 hours. Coags: No results for input(s): INR in the last 72 hours.  Invalid input(s): PT Microbiology: Recent Results (from the past 240 hour(s))  Blood culture (routine x 2)     Status: None   Collection Time: 09/20/15  2:51 PM  Result Value Ref Range Status   Specimen Description BLOOD RIGHT ANTECUBITAL  Final   Special Requests BOTTLES DRAWN AEROBIC AND ANAEROBIC 5 CC EACH  Final   Culture   Final    NO GROWTH 5 DAYS Performed at [facility]    Report Status 09/25/2015 FINAL  Final  Blood culture (routine x 2)     Status: None   Collection Time: 09/20/15  4:35  PM  Result Value Ref Range Status   Specimen Description BLOOD RIGHT ARM  Final   Special Requests BOTTLES DRAWN AEROBIC AND ANAEROBIC 5CC  Final   Culture   Final    NO GROWTH 5 DAYS Performed at [facility]    Report Status 09/25/2015 FINAL  Final  Urine culture     Status: Abnormal   Collection Time: 09/21/15  5:03 AM  Result Value Ref Range Status   Specimen Description URINE, CLEAN CATCH  Final   Special Requests NONE  Final   Culture (A)  Final    4,000 COLONIES/mL INSIGNIFICANT GROWTH Performed at [facility]    Report Status 09/22/2015 FINAL  Final     BRIEF HOSPITAL COURSE:  Acute hypoxic respiratory failure secondary to aspiration pneumonia: Improved with empiric antibiotic therapy.  Blood cultures remain negative so far, he has been managed with continue with empiric vancomycin and Zosyn initially, and then was transitioned to Unasyn. It appears that he has completed 7 days of antibiotic treatment and no longer requires any further antimicrobial treatment  on discharge. Suggest that we repeat a chest x-ray in 3-4 weeks' time. He was followed closely by speech therapy during this hospital stay, initially was started on a dysphagia 1 diet, by day of discharge she has been upgraded to a dysphagia 3 diet. Please ensure patient follows up with speech therapy while at SNF.   Acute metabolic encephalopathy: Resolved. Secondary to above, much improved with antibiotics treatment and other supportive measures. CT head negative for acute abnormalities.   Dysphagia: I suspect worsened following recent surgery-seen by speech therapy, underwent modified barium swallow-initially started on a dysphagia 1 diet-but with improvement he has not been upgraded to a dysphagia 3 diet. Spoke with patient's spouse over the phone on 7/5 and 7/7, explained that patient continues to be at some risk of aspiration-she is understanding of the situation. Recommend that PCP start tapering down  narcotic and sedative medications as much as possible to reduce the risk of aspiration in the future.   Anemia secondary to chronic disease: slight drop in hemoglobin secondary to IV fluid dilution. Follow periodically as an outpatient.  Mild thrombocytopenia: Appears chronic, stable for outpatient follow-up with hematology/oncology  History of ER positive left breast cancer-status post left mastectomy 08/20/15: Reviewed most recent outpatient oncology note, plans are to continue tamoxifen for at least 5 years and perform yearly mammographies.  Hypothyroidism: Continue Synthroid  BPH: Continue Proscar and Flomax.  Depression: Cautiously continue with Abilify and Remeron.  Suspected history of dementia: Resume home medications once encephalopathy improves further.  Chronic pain syndrome: On methadone suspect contributing to encephalopathy on admission-but unable to stop due to risk of withdrawal symptoms. See above-suggest that PCP start tapering down this medication in the near future.  TODAY-DAY OF DISCHARGE:  Subjective:   Chiquita Loth todayIs awake, alert and answering all my questions appropriately.  Objective:   Blood pressure 118/54, pulse 61, temperature 98 F (36.7 C), temperature source Oral, resp. rate 16, height 5\' 8"  (1.727 m), weight 82.3 kg (181 lb 7 oz), SpO2 91 %.  Intake/Output Summary (Last 24 hours) at 09/26/15 1013 Last data filed at 09/26/15 0514  Gross per 24 hour  Intake    440 ml  Output    175 ml  Net    265 ml   Filed Weights   09/20/15 1754  Weight: 82.3 kg (181 lb 7 oz)    Exam Awake Alert, No new F.N deficits, Normal affect [place].AT,PERRAL Supple Neck,No JVD, No cervical lymphadenopathy appriciated.  Symmetrical Chest wall movement, Good air movement bilaterally, CTAB RRR,No Gallops,Rubs or new Murmurs, No Parasternal Heave +ve B.Sounds, Abd Soft, Non tender, No organomegaly appriciated, No rebound -guarding or rigidity. No Cyanosis, Clubbing or  edema, No new Rash or bruise  DISCHARGE CONDITION: Stable  DISPOSITION: SNF   DISCHARGE INSTRUCTIONS:    Activity:  As tolerated with Full fall precautions use walker/cane & assistance as needed  Get Medicines reviewed and adjusted: Please take all your medications with you for your next visit with your Primary MD  Please request your Primary MD to go over all hospital tests and procedure/radiological results at the follow up, please ask your Primary MD to get all Hospital records sent to his/her office.  If you experience worsening of your admission symptoms, develop shortness of breath, life threatening emergency, suicidal or homicidal thoughts you must seek medical attention immediately by calling 911 or calling your MD immediately  if symptoms less severe.  You must read complete instructions/literature along with all the possible adverse  reactions/side effects for all the Medicines you take and that have been prescribed to you. Take any new Medicines after you have completely understood and accpet all the possible adverse reactions/side effects.   Do not drive when taking Pain medications.   Do not take more than prescribed Pain, Sleep and Anxiety Medications  Special Instructions: If you have smoked or chewed Tobacco  in the last 2 yrs please stop smoking, stop any regular Alcohol  and or any Recreational drug use.  Wear Seat belts while driving.  Please note  You were cared for by a hospitalist during your hospital stay. Once you are discharged, your primary care physician will handle any further medical issues. Please note that NO REFILLS for any discharge medications will be authorized once you are discharged, as it is imperative that you return to your primary care physician (or establish a relationship with a primary care physician if you do not have one) for your aftercare needs so that they can reassess your need for medications and monitor your lab values.   Diet  recommendation: See below  Discharge Instructions    Diet - low sodium heart healthy    Complete by:  As directed   Dysphagia 3 diet with nectar thick liquids with STRICT precautions Liquids provided via: Cup;Teaspoon;No straw Medication Administration: Crushed with puree Supervision: Full supervision/cueing for compensatory strategies Compensations: Minimize environmental distractions;Slow rate;Small sips/bites;Multiple dry swallows after each bite/sip Postural Changes and/or Swallow Maneuvers: Seated upright 90 degrees     Increase activity slowly    Complete by:  As directed            Follow-up Information    Follow up with [name], MD. Schedule an appointment as soon as possible for a visit in 1 week.   Specialty:  Internal Medicine   Contact information:   [place] [id number] [phone]      Total Time spent on discharge equals  45 minutes.  SignedOren Binet 09/26/2015 10:13 AM

## 2015-09-26 NOTE — Progress Notes (Signed)
Pt / spouse are in agreement with d/c to [facility] today. [facility] transport is required. Medical necessity form completed. D/C Summary sent to SNF for review. DNR / Scripts included in d/c packet. # for report provided to nsg.  Werner Lean LCSW [phone]

## 2015-09-26 NOTE — Telephone Encounter (Signed)
Possible re-admission to facility  - This is a patient you were seeing at **[facility]** - [facility] fu is needed IF patient was re-admitted to facility upon discharge. Hospital discharge **09/26/15**

## 2015-10-01 ENCOUNTER — Non-Acute Institutional Stay (SKILLED_NURSING_FACILITY): Payer: Medicare Other | Admitting: Adult Health

## 2015-10-01 ENCOUNTER — Encounter: Payer: Self-pay | Admitting: Adult Health

## 2015-10-01 DIAGNOSIS — K5909 Other constipation: Secondary | ICD-10-CM

## 2015-10-01 DIAGNOSIS — G609 Hereditary and idiopathic neuropathy, unspecified: Secondary | ICD-10-CM | POA: Diagnosis not present

## 2015-10-01 DIAGNOSIS — N400 Enlarged prostate without lower urinary tract symptoms: Secondary | ICD-10-CM

## 2015-10-01 DIAGNOSIS — F0391 Unspecified dementia with behavioral disturbance: Secondary | ICD-10-CM | POA: Diagnosis not present

## 2015-10-01 DIAGNOSIS — F333 Major depressive disorder, recurrent, severe with psychotic symptoms: Secondary | ICD-10-CM | POA: Diagnosis not present

## 2015-10-01 DIAGNOSIS — E038 Other specified hypothyroidism: Secondary | ICD-10-CM | POA: Diagnosis not present

## 2015-10-01 DIAGNOSIS — C50822 Malignant neoplasm of overlapping sites of left male breast: Secondary | ICD-10-CM | POA: Diagnosis not present

## 2015-10-01 DIAGNOSIS — M549 Dorsalgia, unspecified: Secondary | ICD-10-CM

## 2015-10-01 DIAGNOSIS — E034 Atrophy of thyroid (acquired): Secondary | ICD-10-CM

## 2015-10-01 DIAGNOSIS — K219 Gastro-esophageal reflux disease without esophagitis: Secondary | ICD-10-CM

## 2015-10-01 DIAGNOSIS — J690 Pneumonitis due to inhalation of food and vomit: Secondary | ICD-10-CM

## 2015-10-01 DIAGNOSIS — G8929 Other chronic pain: Secondary | ICD-10-CM

## 2015-10-01 DIAGNOSIS — K5900 Constipation, unspecified: Secondary | ICD-10-CM | POA: Diagnosis not present

## 2015-10-01 DIAGNOSIS — F03918 Unspecified dementia, unspecified severity, with other behavioral disturbance: Secondary | ICD-10-CM

## 2015-10-01 NOTE — Progress Notes (Signed)
Patient ID: George Foster, male   DOB: [birth year], 82 y.o.   MRN: [id number]    Location:   [place] Room Number: 217-A Place of Service:  SNF (31)   CODE STATUS: DNR  Allergies  Allergen Reactions  . Cogentin [Benztropine] Other (See Comments)    unkown    Chief Complaint  Patient presents with  . Hospitalization Follow-up    Hospital Follow up    HPI:  He is a long term resident of this facility who has been hospitalized for acute respiratory failure due to aspiration pneumonia. He has had a recent left mastectomy in May of this year. Today he is lethargic and cannot fully participate in the hpi or ros. There are no nursing concerns at this time.   Past Medical History  Diagnosis Date  . Dementia   . Hypertension   . Arthritis   . Hypothyroidism   . Chronic back pain   . Anxiety disorder   . Gastroesophageal reflux disease   . Polypharmacy     History of hospitalizations for altered mental status related to medications  . Chronic constipation     with fecal impactions, recurrent  . Suicidal ideation 06/06/2011  . Traumatic subdural hematoma ([place]) 10/01/2013  . Multiple facial fractures ([place]) 10/02/2013  . Basilar skull fracture ([place]) 10/02/2013  . Acute blood loss anemia 10/02/2013  . Neuropathy ([place])   . Depression   . Mood disorder ([place])   . BPH (benign prostatic hyperplasia)   . Tremor   . Osteoarthritis   . Anxiety   . UTI (lower urinary tract infection)   . Cancer of left breast [facility])     Past Surgical History  Procedure Laterality Date  . Appendectomy    . Multiple tooth extractions      Removal of Nonrestorable teeth numbers 2, 4, 6, 12, 13, 18, 29  . Skin surgery      Cancer cells removed  . Prostate surgery      removed x2  . Eye surgery    . Mastectomy  08/20/2015  . Total mastectomy Left 08/20/2015    Procedure: LEFT TOTAL MASTECTOMY;  Surgeon: Fanny Skates, MD;  Location: [place];  Service: General;  Laterality: Left;    Social  History   Social History  . Marital Status: Married    Spouse Name: Donia Guiles  . Number of Children: 5  . Years of Education: 12   Occupational History  . Retired Librarian, academic in a [employer]  . Smoking status: Never Smoker   . Smokeless tobacco: Never Used  . Alcohol Use: No     Comment: Pt denies  . Drug Use: No     Comment: Pt denies  . Sexual Activity: Not on file   Other Topics Concern  . Not on file   Social History Narrative   Married.  Lives in [place] with wife.  Ambulates with a walker.   Family History  Problem Relation Age of Onset  . Heart disease Father   . Cancer Maternal Grandmother     dx. 90s - NOS type  . Breast cancer Paternal Grandmother     dx later in life  . Throat cancer Son 40    pt's wife said he is NOT aware of this!      VITAL SIGNS BP 140/64 mmHg  Pulse 61  Temp(Src) 98.4 F (36.9 C) (Oral)  Resp 18  Ht 5\' 7"  (1.702 m)  Wt 184 lb (83.462 kg)  BMI 28.81 kg/m2  SpO2 96%  Patient's Medications  New Prescriptions   No medications on file  Previous Medications   ARIPIPRAZOLE (ABILIFY) 15 MG TABLET    Take 15 mg by mouth daily.   CYANOCOBALAMIN 1000 MCG TABLET    Take 1,000 mcg by mouth daily.    FINASTERIDE (PROSCAR) 5 MG TABLET    Take 5 mg by mouth daily.   GABAPENTIN (NEURONTIN) 600 MG TABLET    Take 600 mg by mouth 3 (three) times daily.    LEVOTHYROXINE (SYNTHROID, LEVOTHROID) 88 MCG TABLET    Take 88 mcg by mouth daily before breakfast.    LINACLOTIDE (LINZESS) 290 MCG CAPS CAPSULE    Take 290 mcg by mouth every morning.    METHADONE (DOLOPHINE) 5 MG TABLET    Take one tablet by mouth every 12 hours for pain. Please do not give if sedated or lethargic   MIRTAZAPINE (REMERON) 15 MG TABLET    Take 15 mg by mouth at bedtime.   MULTIPLE VITAMINS-MINERALS (MULTIVITAMINS THER. W/MINERALS) TABS    Take 1 tablet by mouth daily.   OMEPRAZOLE (PRILOSEC) 40 MG CAPSULE    Take 40 mg by mouth daily.   POLYETHYLENE  GLYCOL (MIRALAX / GLYCOLAX) PACKET    Take 17 g by mouth daily.   RIVASTIGMINE 13.3 MG/24HR PT24    Place 1 patch onto the skin every morning.   SENNOSIDES-DOCUSATE SODIUM (SENOKOT-S) 8.6-50 MG TABLET    Take 2 tablets by mouth at bedtime.   TAMOXIFEN (NOLVADEX) 20 MG TABLET    Take 20 mg by mouth daily.   TAMSULOSIN (FLOMAX) 0.4 MG CAPS    Take 0.4 mg by mouth at bedtime.   Modified Medications   No medications on file  Discontinued Medications   No medications on file     SIGNIFICANT DIAGNOSTIC EXAMS  10-24-14: chest x-ray: Mild scarring left base. No edema or consolidation. No pneumothorax evident  10-24-14:lumbar spine x-ray: Multilevel osteoarthritic change. Scoliosis. No acute fracture or spondylolisthesis.  10-24-14 thoracic spine x-ray: No acute fracture or listhesis identified in the thoracic spine. Multilevel degenerative disease, moderate to severe.  10-24-14: ct of head and cervical spine:  CT HEAD:  Remote left zygomatic arch fracture.  No acute calvarial fracture. No intracranial hemorrhage. Small vessel disease type changes without CT evidence of large acute Infarct. Atrophy without hydrocephalus.   CT CERVICAL SPINE: No cervical spine fracture. Cervical spondylotic changes with spinal stenosis most notable C5-6 with mild cord compression greater on the left.  10-30-14: chest x-ray: Mild scarring left base.  No edema or consolidation.  02-04-15: chest x-ray; negative for acute cardiopulmonary disease   06-09-15: left breat ultrasound: solid mass left breast 1.9 x 1.9 x 2.5 cm suspicious of malignancy   06-13-15: diagnostic mammogram: Highly suspicious 2 cm retroareolar left breast mass -tissue sampling is recommended.  06-18-15: left breast biopsy: Ultrasound guided biopsy of a subareolar left breast mass. No apparent complications.   08-20-15: chest x-ray: No active cardiopulmonary disease  09-20-15: chest x-ray: Right lower lobe pneumonia. Followup PA and lateral chest  X-ray is recommended in 3-4 weeks following trial of antibiotic therapy to ensure resolution and exclude underlying malignancy.  09-20-15: ct of head: No acute intracranial findings. Chronic ischemic microvascular disease and mild age related atrophic change.  09-22-15: swallow study: Dysphagia 1 ([place]) solids;Nectar thick liquid   LABS REVIEWED:   10-24-14: wbc 3.8; hgb 11.9; hct 35.9; mcv 100.8; plt  159; glucose 137; bun 25; creat 1.18; k+3.7; na++137 10-30-14: wbc 4.9; hgb 12.5; hct 37.4; mcv 103.6; plt 180; glucose 115; bun 22; creat 1.11; k+4.5; na++138; liver normal albumin 4.0  10-31-14:wbc 5.9; hgb 13.3; hct 40.6; mcv 106; plt 180 glucose 168; bun 23; creat 1.17; k+ 4.6; na++138 liver normal albumin 4.0; vit b12: 306; tsh 2.02;  vit d 42.5; hgb a1c 5.2 11-14-14: wbc 4.3; hgb 10.7; hct 34.5 ;mcv 107; plt 174; glucose 124; bun 24; creat 1.05; k+4.5; na++140; liver normal albumin 3.3  12-07-14: klebsiella pneumoniae: levaquin  02-04-15: wbc 9.7; hgb 11.0; hct 32.6; mcv 100.3; plt 156; glucose 185; bun 28.1;creat1.5  k+4.3; na++137; liver normal albumin 3.4; influenza A/B: neg 03-26-15: wbc 4.7; hgb 11.4; hct 35; plt 149; glucose 88; bun 24; creat 1.1; k+ 4.0; na++144 tsh 1.27; vit d 22.37; vit B12 440  08-20-15: wbc 5.8; hgb 12.5; hct 38.4; mcv 102.7; plt 133; glucose 100; bun 25; creat 1.24; k+ 4.2; na++ 141; liver normal albumin 4.0 08-21-15; wbc 5.6; hgb 10.4; hct 32.6; mcv 10.4;8 plt 121; glucose 91; bun 23; creat 1.23; k+ 4.3; na++ 139  09-20-15: wbc 9.7; hgb 11.9; hct 34.8; mcv 99.1; plt 140; glucose 146; bun 32; creat 1.57; k+ 4.3; na++ 140; liver normal albumin 3.6 09-22-15: wbc 5.3; hgb 9.3; hct 27.4; mcv 101.9; plt 119; glucose 104; bun 27; creat 1.19; k+ 4.1; na+= 138 09-25-15: glucose 117; bun 26; creat 0.92; k+ 4.4; na++ 143      Review of Systems  Unable to perform ROS: Other  lethargic       Physical Exam  Constitutional: No distress.  Thin   Eyes: Conjunctivae are normal.  Neck:  Neck supple. No JVD present. No thyromegaly present.  Cardiovascular: Normal rate, regular rhythm and intact distal pulses.   Respiratory: Effort normal and breath sounds normal. No respiratory distress. He has no wheezes.  Is status post left mastectomy   GI: Soft. Bowel sounds are normal. He exhibits no distension. There is no tenderness.  Musculoskeletal: He exhibits no edema.  Able to move all extremities  Has limited range of motion in neck   Lymphadenopathy:    He has no cervical adenopathy.  Neurological:  Is lethargic   Skin: Skin is warm and dry. He is not diaphoretic.  Psychiatric: He has a normal mood and affect.     ASSESSMENT/ PLAN:  1. Chronic back pain: with cervical osteoarthritis: his pain is presently being managed on his current regimen of: methadone 5 mg twice daily; will continue neurontin 600 mg three times daily   2. BPH without obstruction: will continue proscar 5 mg daily and flomax 0.4 mg daily   3. Hypothyroidism: will continue synthroid 88 mcg daily his tsh is 2.02   4. GERD; will continue prilosec 40 mg daily   5. Chronic constipation: related to narcotic use: will continue  linzess 290 mcg daily; miralax 17 gm daily; and senna s 2 tabs nightly   6. Dementia: is without change in status; will continue exelon patch 13.3 mg daily   7. Psychosis with anxiety: will continue abilify 15 mg daily  And remeron 15 mg nightly to help with his sleep will monitor his status.   8. Hypertension: is stable is presently not on medications; will not make changes and will monitor his status.   9. Left breast cancer: is status post left breast mastectomy  He has been started on tamoxifen 20 mg daily is followed by oncology  10. Aspiration pneurmonia: he has completed his abt; will repeat chest x-ray in 4 weeks  11. Dysphagia: no further signs of aspiration; will continue nectar thick liquids.    Will check cbc; cmp    Time spent with patient 50  minutes >50%  time spent counseling; reviewing medical record; tests; labs; and developing future plan of care  Ok Edwards NP [facility]  Contact [phone] Monday through Friday 8am- 5pm  After hours call [phone]

## 2015-10-02 ENCOUNTER — Non-Acute Institutional Stay (SKILLED_NURSING_FACILITY): Payer: Medicare Other | Admitting: Internal Medicine

## 2015-10-02 ENCOUNTER — Encounter: Payer: Self-pay | Admitting: Internal Medicine

## 2015-10-02 DIAGNOSIS — J690 Pneumonitis due to inhalation of food and vomit: Secondary | ICD-10-CM | POA: Diagnosis not present

## 2015-10-02 DIAGNOSIS — D638 Anemia in other chronic diseases classified elsewhere: Secondary | ICD-10-CM | POA: Diagnosis not present

## 2015-10-02 DIAGNOSIS — F329 Major depressive disorder, single episode, unspecified: Secondary | ICD-10-CM | POA: Diagnosis not present

## 2015-10-02 DIAGNOSIS — C50022 Malignant neoplasm of nipple and areola, left male breast: Secondary | ICD-10-CM | POA: Diagnosis not present

## 2015-10-02 DIAGNOSIS — N400 Enlarged prostate without lower urinary tract symptoms: Secondary | ICD-10-CM

## 2015-10-02 DIAGNOSIS — M549 Dorsalgia, unspecified: Secondary | ICD-10-CM

## 2015-10-02 DIAGNOSIS — G9341 Metabolic encephalopathy: Secondary | ICD-10-CM

## 2015-10-02 DIAGNOSIS — D696 Thrombocytopenia, unspecified: Secondary | ICD-10-CM | POA: Diagnosis not present

## 2015-10-02 DIAGNOSIS — J9601 Acute respiratory failure with hypoxia: Secondary | ICD-10-CM

## 2015-10-02 DIAGNOSIS — E034 Atrophy of thyroid (acquired): Secondary | ICD-10-CM

## 2015-10-02 DIAGNOSIS — F32A Depression, unspecified: Secondary | ICD-10-CM

## 2015-10-02 DIAGNOSIS — G8929 Other chronic pain: Secondary | ICD-10-CM

## 2015-10-02 DIAGNOSIS — R1310 Dysphagia, unspecified: Secondary | ICD-10-CM

## 2015-10-02 DIAGNOSIS — E038 Other specified hypothyroidism: Secondary | ICD-10-CM

## 2015-10-02 NOTE — Progress Notes (Signed)
MRN: [id number] Name: George Foster  Sex: male Age: 82 y.o. DOB: 1925/02/22  [place] #:  Facility/Room: [facility] / 217 A Level Of Care: SNF Provider: Noah Delaine. Sheppard Coil, MD Emergency Contacts: Extended Emergency Contact Information Primary Emergency Contact: Trinda Pascal Address: [address]          [place], [place] [address] Johnnette Litter of [place] Phone: [phone] Mobile Phone: [phone] Relation: Spouse  Code Status: DNR  Allergies: Cogentin  Chief Complaint  Patient presents with  . New Admit To SNF    Admit to Facility    HPI: Patient is 82 y.o. male who has breast cancer status post left sided mastectomy on 5/31 who was admitted to [facility] from 7/1-7 for what was felt to be an aspiration PNA. Hospital course was complicated by encephalopathy. Pt is d/c back to SNF for residential care. While at SNF pt will be followed for breast CA, tx with tamoxifen, hypothyroidism, tx with synthroid and BPH, tx with flomax and proscar.  Past Medical History  Diagnosis Date  . Dementia   . Hypertension   . Arthritis   . Hypothyroidism   . Chronic back pain   . Anxiety disorder   . Gastroesophageal reflux disease   . Polypharmacy     History of hospitalizations for altered mental status related to medications  . Chronic constipation     with fecal impactions, recurrent  . Suicidal ideation 06/06/2011  . Traumatic subdural hematoma ([place]) 10/01/2013  . Multiple facial fractures ([place]) 10/02/2013  . Basilar skull fracture ([place]) 10/02/2013  . Acute blood loss anemia 10/02/2013  . Neuropathy ([place])   . Depression   . Mood disorder ([place])   . BPH (benign prostatic hyperplasia)   . Tremor   . Osteoarthritis   . Anxiety   . UTI (lower urinary tract infection)   . Cancer of left breast [facility])     Past Surgical History  Procedure Laterality Date  . Appendectomy    . Multiple tooth extractions      Removal of Nonrestorable teeth numbers 2, 4, 6, 12, 13, 18, 29  . Skin surgery       Cancer cells removed  . Prostate surgery      removed x2  . Eye surgery    . Mastectomy  08/20/2015  . Total mastectomy Left 08/20/2015    Procedure: LEFT TOTAL MASTECTOMY;  Surgeon: Fanny Skates, MD;  Location: [facility];  Service: General;  Laterality: Left;      Medication List       This list is accurate as of: 10/02/15 11:32 AM.  Always use your most recent med list.               ARIPiprazole 15 MG tablet  Commonly known as:  ABILIFY  Take 15 mg by mouth daily.     cyanocobalamin 1000 MCG tablet  Take 1,000 mcg by mouth daily.     finasteride 5 MG tablet  Commonly known as:  PROSCAR  Take 5 mg by mouth daily.     gabapentin 600 MG tablet  Commonly known as:  NEURONTIN  Take 600 mg by mouth 3 (three) times daily.     levothyroxine 88 MCG tablet  Commonly known as:  SYNTHROID, LEVOTHROID  Take 88 mcg by mouth daily before breakfast.     LINZESS 290 MCG Caps capsule  Generic drug:  linaclotide  Take 290 mcg by mouth every morning.     methadone 5 MG tablet  Commonly known as:  DOLOPHINE  Take one tablet by mouth every 12 hours for pain. Please do not give if sedated or lethargic     mirtazapine 15 MG tablet  Commonly known as:  REMERON  Take 15 mg by mouth at bedtime.     multivitamins ther. w/minerals Tabs tablet  Take 1 tablet by mouth daily.     omeprazole 40 MG capsule  Commonly known as:  PRILOSEC  Take 40 mg by mouth daily.     polyethylene glycol packet  Commonly known as:  MIRALAX / GLYCOLAX  Take 17 g by mouth daily.     Rivastigmine 13.3 MG/24HR Pt24  Place 1 patch onto the skin every morning.     sennosides-docusate sodium 8.6-50 MG tablet  Commonly known as:  SENOKOT-S  Take 2 tablets by mouth at bedtime.     tamoxifen 20 MG tablet  Commonly known as:  NOLVADEX  Take 20 mg by mouth daily.     tamsulosin 0.4 MG Caps capsule  Commonly known as:  FLOMAX  Take 0.4 mg by mouth at bedtime.        No orders of the defined types  were placed in this encounter.    Immunization History  Administered Date(s) Administered  . Influenza-Unspecified 10/08/2013  . PPD Test 10/07/2013  . Pneumococcal Polysaccharide-23 09/21/2015  . Tdap 09/27/2013    Social History  Substance Use Topics  . Smoking status: Never Smoker   . Smokeless tobacco: Never Used  . Alcohol Use: No     Comment: Pt denies    Family history is   Family History  Problem Relation Age of Onset  . Heart disease Father   . Cancer Maternal Grandmother     dx. 90s - NOS type  . Breast cancer Paternal Grandmother     dx later in life  . Throat cancer Son 48    pt's wife said he is NOT aware of this!      Review of Systems  DATA OBTAINED: from patient, nurse GENERAL:  no fevers,+fatigue, poor appetite  SKIN: chrest incision looks good EYES: No eye pain, redness, discharge EARS: No earache, tinnitus, change in hearing NOSE: No congestion, drainage or bleeding  MOUTH/THROAT: No mouth or tooth pain, No sore throat RESPIRATORY: No cough, wheezing, SOB CARDIAC: No chest pain, palpitations, lower extremity edema  GI: No abdominal pain, No N/V/D or constipation, No heartburn or reflux  GU: No dysuria, frequency or urgency, or incontinence  MUSCULOSKELETAL: No unrelieved bone/joint pain NEUROLOGIC: No headache, dizziness or focal weakness PSYCHIATRIC: No c/o anxiety or sadness   Filed Vitals:   10/02/15 1121  BP: 140/64  Pulse: 61  Temp: 98.4 F (36.9 C)  Resp: 18    SpO2 Readings from Last 1 Encounters:  10/02/15 96%        Physical Exam  GENERAL APPEARANCE: quiet, conversant,  No acute distress, looks like he has aged 20 years; he is perfectly appropriate but doesn't open his eyes to speak to me.  SKIN: chest incision looks good HEAD: Normocephalic, atraumatic  EYES: Conjunctiva/lids clear. Pupils round, reactive. EOMs intact.  EARS: External exam WNL, canals clear. Hearing grossly normal.  NOSE: No deformity or discharge.   MOUTH/THROAT: Lips w/o lesions  RESPIRATORY: Breathing is even, unlabored. Lung sounds are clear   CARDIOVASCULAR: Heart RRR no murmurs, rubs or gallops. No peripheral edema.   GASTROINTESTINAL: Abdomen is soft, non-tender, not distended w/ normal bowel sounds. GENITOURINARY: Bladder non tender, not  distended  MUSCULOSKELETAL: No abnormal joints or musculature NEUROLOGIC:  Cranial nerves 2-12 grossly intact. Moves all extremities  PSYCHIATRIC: Mood and affect appropriate to situation, no behavioral issues  Patient Active Problem List   Diagnosis Date Noted  . Pressure ulcer 09/24/2015  . HCAP (healthcare-associated pneumonia) 09/20/2015  . Altered mental state 09/20/2015  . History of melanoma 09/09/2015  . Family history of breast cancer in male 09/09/2015  . Cancer of overlapping sites of left male breast ([place]) 07/09/2015  . Vitamin D deficiency 04/21/2015  . Angioedema 01/09/2015  . Major depressive disorder, recurrent episode ([place]) 12/03/2014  . Essential hypertension, benign 11/19/2014  . Psychosis 11/19/2014  . Mood disorder ([place]) 10/31/2014  . Hereditary and idiopathic peripheral neuropathy 07/01/2014  . Insomnia 04/19/2014  . Fall 10/02/2013  . BPH (benign prostatic hyperplasia) 02/17/2013  . Osteoarthritis cervical spine 02/17/2013  . Chronic constipation   . Chronic back pain 06/08/2011  . Dementia with behavioral disturbance 06/06/2011  . Hypothyroidism   . Gastroesophageal reflux disease   . Anxiety disorder        Component Value Date/Time   WBC 5.3 09/22/2015 0457   WBC 4.7 03/25/2015   RBC 2.69* 09/22/2015 0457   HGB 9.3* 09/22/2015 0457   HCT 27.4* 09/22/2015 0457   PLT 119* 09/22/2015 0457   MCV 101.9* 09/22/2015 0457   LYMPHSABS 0.6* 09/20/2015 1451   MONOABS 0.6 09/20/2015 1451   EOSABS 0.0 09/20/2015 1451   BASOSABS 0.0 09/20/2015 1451        Component Value Date/Time   NA 143 09/25/2015 0428   NA 143 09/25/2015   K 4.4 09/25/2015 0428    CL 114* 09/25/2015 0428   CO2 26 09/25/2015 0428   GLUCOSE 117* 09/25/2015 0428   BUN 26* 09/25/2015 0428   BUN 26* 09/25/2015   CREATININE 0.92 09/25/2015 0428   CREATININE 0.9 09/25/2015   CALCIUM 8.1* 09/25/2015 0428   PROT 5.8* 09/21/2015 0515   ALBUMIN 3.1* 09/21/2015 0515   AST 46* 09/21/2015 0515   ALT 17 09/21/2015 0515   ALKPHOS 43 09/21/2015 0515   BILITOT 0.8 09/21/2015 0515   GFRNONAA >60 09/25/2015 0428   GFRAA >60 09/25/2015 0428    No results found for: HGBA1C  No results found for: CHOL, HDL, LDLCALC, LDLDIRECT, TRIG, CHOLHDL   Dg Chest 2 View  09/20/2015  CLINICAL DATA:  Fever and cough EXAM: CHEST  2 VIEW COMPARISON:  08/20/2015 FINDINGS: Cardiac shadow is within normal limits. Increased density is noted in the right lung base projecting in the right lower lobe consistent with acute pneumonia. The left lung is clear. No bony abnormality is noted. IMPRESSION: Right lower lobe pneumonia. Followup PA and lateral chest X-ray is recommended in 3-4 weeks following trial of antibiotic therapy to ensure resolution and exclude underlying malignancy. Electronically Signed   By: Inez Catalina M.D.   On: 09/20/2015 15:18   Ct Head Wo Contrast  09/20/2015  CLINICAL DATA:  Patient noted to be much less active than normal with low-grade fever. EXAM: CT HEAD WITHOUT CONTRAST TECHNIQUE: Contiguous axial images were obtained from the base of the skull through the vertex without intravenous contrast. COMPARISON:  10/24/2014 FINDINGS: The ventricles and cisterns are within normal. There is minimal prominence of the sulcal spaces compatible with age related atrophic change. There is mild chronic ischemic microvascular disease. There is no mass, mass effect, shift of midline structures or acute hemorrhage. There is no evidence of acute infarction. Remaining bones and soft  tissues are within normal. IMPRESSION: No acute intracranial findings. Chronic ischemic microvascular disease and mild age  related atrophic change. Electronically Signed   By: Marin Olp M.D.   On: 09/20/2015 17:19    Not all labs, radiology exams or other studies done during hospitalization come through on my EPIC note; however they are reviewed by me.    Assessment and Plan  Acute hypoxic respiratory failure secondary to aspiration pneumonia: Improved with empiric antibiotic therapy.  Blood cultures were negative so far, managed wi with empiric vancomycin and Zosyn initially, and then Unasyn. Has completed 7 days abx;no further needed  DYSPHAGIA -worked with [facility] and improved from dysphaia 1 to dysphagia 3 diet SNF - cont dysphagia 1 diet;ST will work with pt  ACUTE METABOLIC ENCEPHALOPATHY SNF - 2/2 to PNA, resolved;CT head neg  ANEMIA SNF - f/u CBC  THROMBOCYTOPENIA- mild, stable SNF - will f.u CBC  BREAST CA- s/p L mastectomy 07/2015;plan are to cont tamoxifen for 5 years at least  DEPRESSION SNF- cont abilify and remeron  BPH SNF - cont flomax and proscar  HYPOTHYROIDISM SNF- cont synthroid  CHRONIC PAIN SYNDROME - cont methadone, highly unlikely to be causing encephalopathy as he has been on it for years; in any case pt is FTT and unlikely pt will live long enough for it to matter    Time spent > 45 min;> 50% of time with patient was spent reviewing records, labs, tests and studies, counseling and developing plan of care  Webb Silversmith D. Sheppard Coil, MD

## 2015-10-14 DIAGNOSIS — J690 Pneumonitis due to inhalation of food and vomit: Secondary | ICD-10-CM | POA: Insufficient documentation

## 2015-10-19 ENCOUNTER — Encounter: Payer: Self-pay | Admitting: Internal Medicine

## 2015-10-19 DIAGNOSIS — J9601 Acute respiratory failure with hypoxia: Secondary | ICD-10-CM | POA: Insufficient documentation

## 2015-10-19 DIAGNOSIS — D696 Thrombocytopenia, unspecified: Secondary | ICD-10-CM | POA: Insufficient documentation

## 2015-10-19 DIAGNOSIS — C50919 Malignant neoplasm of unspecified site of unspecified female breast: Secondary | ICD-10-CM | POA: Insufficient documentation

## 2015-10-19 DIAGNOSIS — R1310 Dysphagia, unspecified: Secondary | ICD-10-CM | POA: Insufficient documentation

## 2015-10-19 DIAGNOSIS — F329 Major depressive disorder, single episode, unspecified: Secondary | ICD-10-CM | POA: Insufficient documentation

## 2015-10-19 DIAGNOSIS — F32A Depression, unspecified: Secondary | ICD-10-CM | POA: Insufficient documentation

## 2015-10-19 DIAGNOSIS — D638 Anemia in other chronic diseases classified elsewhere: Secondary | ICD-10-CM | POA: Insufficient documentation

## 2015-10-19 DIAGNOSIS — G9341 Metabolic encephalopathy: Secondary | ICD-10-CM | POA: Insufficient documentation

## 2015-10-28 ENCOUNTER — Non-Acute Institutional Stay (SKILLED_NURSING_FACILITY): Payer: Medicare Other | Admitting: Adult Health

## 2015-10-28 ENCOUNTER — Encounter: Payer: Self-pay | Admitting: Adult Health

## 2015-10-28 DIAGNOSIS — R627 Adult failure to thrive: Secondary | ICD-10-CM | POA: Diagnosis not present

## 2015-10-28 DIAGNOSIS — L89154 Pressure ulcer of sacral region, stage 4: Secondary | ICD-10-CM

## 2015-10-28 DIAGNOSIS — C50822 Malignant neoplasm of overlapping sites of left male breast: Secondary | ICD-10-CM

## 2015-10-28 DIAGNOSIS — G8929 Other chronic pain: Secondary | ICD-10-CM

## 2015-10-28 NOTE — Progress Notes (Signed)
Patient ID: George Foster, male   DOB: 1925-03-11, 82 y.o.   MRN: [id number]   Location:   [facility] [place] Room Number: 217-A Place of Service:  SNF (31)   CODE STATUS: DNR  Allergies  Allergen Reactions  . Cogentin [Benztropine] Other (See Comments)    unkown    Chief Complaint  Patient presents with  . Acute Visit    Wound    HPI:  Staff has asked me to review his sacral wound and pain management. His wound is foul; his wife desires for only comfort care for his wound. He is not verbally responsive but does not appear to be comfortable.   Past Medical History:  Diagnosis Date  . Acute blood loss anemia 10/02/2013  . Anxiety   . Anxiety disorder   . Arthritis   . Basilar skull fracture ([place]) 10/02/2013  . BPH (benign prostatic hyperplasia)   . Cancer of left breast ([place])   . Chronic back pain   . Chronic constipation    with fecal impactions, recurrent  . Dementia   . Depression   . Gastroesophageal reflux disease   . Hypertension   . Hypothyroidism   . Mood disorder ([place])   . Multiple facial fractures ([place]) 10/02/2013  . Neuropathy ([place])   . Osteoarthritis   . Polypharmacy    History of hospitalizations for altered mental status related to medications  . Suicidal ideation 06/06/2011  . Traumatic subdural hematoma ([place]) 10/01/2013  . Tremor   . UTI (lower urinary tract infection)     Past Surgical History:  Procedure Laterality Date  . APPENDECTOMY    . EYE SURGERY    . MASTECTOMY  08/20/2015  . MULTIPLE TOOTH EXTRACTIONS     Removal of Nonrestorable teeth numbers 2, 4, 6, 12, 13, 18, 29  . PROSTATE SURGERY     removed x2  . SKIN SURGERY     Cancer cells removed  . TOTAL MASTECTOMY Left 08/20/2015   Procedure: LEFT TOTAL MASTECTOMY;  Surgeon: Fanny Skates, MD;  Location: [facility];  Service: General;  Laterality: Left;    Social History   Social History  . Marital status: Married    Spouse name: Donia Guiles  . Number of children: 5  . Years of  education: 12   Occupational History  . Retired Librarian, academic in a [employer]  . Smoking status: Never Smoker  . Smokeless tobacco: Never Used  . Alcohol use No     Comment: Pt denies  . Drug use: No     Comment: Pt denies  . Sexual activity: Not on file   Other Topics Concern  . Not on file   Social History Narrative   Married.  Lives in [place] with wife.  Ambulates with a walker.   Family History  Problem Relation Age of Onset  . Heart disease Father   . Cancer Maternal Grandmother     dx. 90s - NOS type  . Breast cancer Paternal Grandmother     dx later in life  . Throat cancer Son 57    pt's wife said he is NOT aware of this!      VITAL SIGNS BP (!) 98/50   Pulse 87   Temp 99.2 F (37.3 C) (Oral)   Resp 17   Ht 5\' 7"  (1.702 m)   Wt 160 lb 8 oz (72.8 kg)   SpO2 97%   BMI 25.14 kg/m   Patient's Medications  New Prescriptions   No medications on file  Previous Medications   ACETAMINOPHEN (TYLENOL) 325 MG TABLET    Take 650 mg by mouth every 6 (six) hours as needed.   BISACODYL (DULCOLAX) 10 MG SUPPOSITORY    Place 10 mg rectally every 3 (three) days as needed for moderate constipation.   FINASTERIDE (PROSCAR) 5 MG TABLET    Take 5 mg by mouth daily.   GABAPENTIN (NEURONTIN) 600 MG TABLET    Take 600 mg by mouth 3 (three) times daily.    IPRATROPIUM-ALBUTEROL (DUONEB) 0.5-2.5 (3) MG/3ML SOLN    Take 3 mLs by nebulization every 6 (six) hours.   LEVOTHYROXINE (SYNTHROID, LEVOTHROID) 88 MCG TABLET    Take 88 mcg by mouth daily before breakfast.    LORAZEPAM (ATIVAN) 0.5 MG TABLET    Take by mouth. Take 1-2 every six hours prn   METHADONE (DOLOPHINE) 10 MG TABLET    Take 10 mg by mouth every 12 (twelve) hours.   MIRTAZAPINE (REMERON) 15 MG TABLET    Take 15 mg by mouth at bedtime.   MORPHINE 20 MG/5ML SOLUTION    Take 0.25 mg by mouth every 2 (two) hours as needed for pain.   MULTIPLE VITAMINS-MINERALS (MULTIVITAMINS THER. W/MINERALS)  TABS    Take 1 tablet by mouth daily.   OMEPRAZOLE (PRILOSEC) 40 MG CAPSULE    Take 40 mg by mouth daily.   SENNOSIDES-DOCUSATE SODIUM (SENOKOT-S) 8.6-50 MG TABLET    Take 2 tablets by mouth at bedtime.  Modified Medications   No medications on file  Discontinued Medications   ARIPIPRAZOLE (ABILIFY) 15 MG TABLET    Take 15 mg by mouth daily.   CYANOCOBALAMIN 1000 MCG TABLET    Take 1,000 mcg by mouth daily.    LINACLOTIDE (LINZESS) 290 MCG CAPS CAPSULE    Take 290 mcg by mouth every morning.    METHADONE (DOLOPHINE) 5 MG TABLET    Take one tablet by mouth every 12 hours for pain. Please do not give if sedated or lethargic   POLYETHYLENE GLYCOL (MIRALAX / GLYCOLAX) PACKET    Take 17 g by mouth daily.   RIVASTIGMINE 13.3 MG/24HR PT24    Place 1 patch onto the skin every morning.   TAMOXIFEN (NOLVADEX) 20 MG TABLET    Take 20 mg by mouth daily.   TAMSULOSIN (FLOMAX) 0.4 MG CAPS    Take 0.4 mg by mouth at bedtime.      SIGNIFICANT DIAGNOSTIC EXAMS  10-24-14: chest x-ray: Mild scarring left base. No edema or consolidation. No pneumothorax evident  10-24-14:lumbar spine x-ray: Multilevel osteoarthritic change. Scoliosis. No acute fracture or spondylolisthesis.  10-24-14 thoracic spine x-ray: No acute fracture or listhesis identified in the thoracic spine. Multilevel degenerative disease, moderate to severe.  10-24-14: ct of head and cervical spine:  CT HEAD:  Remote left zygomatic arch fracture.  No acute calvarial fracture. No intracranial hemorrhage. Small vessel disease type changes without CT evidence of large acute Infarct. Atrophy without hydrocephalus.   CT CERVICAL SPINE: No cervical spine fracture. Cervical spondylotic changes with spinal stenosis most notable C5-6 with mild cord compression greater on the left.  10-30-14: chest x-ray: Mild scarring left base.  No edema or consolidation.  02-04-15: chest x-ray; negative for acute cardiopulmonary disease   06-09-15: left breat  ultrasound: solid mass left breast 1.9 x 1.9 x 2.5 cm suspicious of malignancy   06-13-15: diagnostic mammogram: Highly suspicious 2 cm retroareolar left breast mass -tissue sampling is recommended.  06-18-15: left breast biopsy: Ultrasound guided biopsy of a subareolar left breast mass. No apparent complications.   08-20-15: chest x-ray: No active cardiopulmonary disease  09-20-15: chest x-ray: Right lower lobe pneumonia. Followup PA and lateral chest X-ray is recommended in 3-4 weeks following trial of antibiotic therapy to ensure resolution and exclude underlying malignancy.  09-20-15: ct of head: No acute intracranial findings. Chronic ischemic microvascular disease and mild age related atrophic change.  09-22-15: swallow study: Dysphagia 1 (Puree) solids;Nectar thick liquid   LABS REVIEWED:   10-24-14: wbc 3.8; hgb 11.9; hct 35.9; mcv 100.8; plt 159; glucose 137; bun 25; creat 1.18; k+3.7; na++137 10-30-14: wbc 4.9; hgb 12.5; hct 37.4; mcv 103.6; plt 180; glucose 115; bun 22; creat 1.11; k+4.5; na++138; liver normal albumin 4.0  10-31-14:wbc 5.9; hgb 13.3; hct 40.6; mcv 106; plt 180 glucose 168; bun 23; creat 1.17; k+ 4.6; na++138 liver normal albumin 4.0; vit b12: 306; tsh 2.02;  vit d 42.5; hgb a1c 5.2 11-14-14: wbc 4.3; hgb 10.7; hct 34.5 ;mcv 107; plt 174; glucose 124; bun 24; creat 1.05; k+4.5; na++140; liver normal albumin 3.3  12-07-14: klebsiella pneumoniae: levaquin  02-04-15: wbc 9.7; hgb 11.0; hct 32.6; mcv 100.3; plt 156; glucose 185; bun 28.1;creat1.5  k+4.3; na++137; liver normal albumin 3.4; influenza A/B: neg 03-26-15: wbc 4.7; hgb 11.4; hct 35; plt 149; glucose 88; bun 24; creat 1.1; k+ 4.0; na++144 tsh 1.27; vit d 22.37; vit B12 440  08-20-15: wbc 5.8; hgb 12.5; hct 38.4; mcv 102.7; plt 133; glucose 100; bun 25; creat 1.24; k+ 4.2; na++ 141; liver normal albumin 4.0 08-21-15; wbc 5.6; hgb 10.4; hct 32.6; mcv 10.4;8 plt 121; glucose 91; bun 23; creat 1.23; k+ 4.3; na++ 139  09-20-15: wbc  9.7; hgb 11.9; hct 34.8; mcv 99.1; plt 140; glucose 146; bun 32; creat 1.57; k+ 4.3; na++ 140; liver normal albumin 3.6 09-22-15: wbc 5.3; hgb 9.3; hct 27.4; mcv 101.9; plt 119; glucose 104; bun 27; creat 1.19; k+ 4.1; na+= 138 09-25-15: glucose 117; bun 26; creat 0.92; k+ 4.4; na++ 143      Review of Systems  Unable to perform ROS: Other  lethargic       Physical Exam  Constitutional: No distress.  Thin   Neck: Neck supple. No JVD present. No thyromegaly present.  Cardiovascular: Normal rate, regular rhythm and intact distal pulses.   Respiratory: Effort normal and breath sounds normal. No respiratory distress. He has no wheezes.  Is status post left mastectomy   GI: Soft. Bowel sounds are normal. He exhibits no distension.  Musculoskeletal: He exhibits no edema.  Lymphadenopathy:    He has no cervical adenopathy.  Neurological:  Is unaware  Skin: Skin is warm and dry. He is not diaphoretic.   has large necrotic foul sacral wound     ASSESSMENT/ PLAN:  1. Breast cancer 2. Failure to thrive 3. Chronic pain 4. Stage IV sacral wound No further debridement Flagyl 250 mg into wound bed cover with duoderm every 3 days and as needed Change roxanol to 5 mg every 2 hours routinely and every  Hour as needed Will stop mvi; duoneb; proscar; remeron   Time spent with patient  40  minutes >50% time spent counseling; reviewing medical record; tests; labs; and developing future plan of care     MD is aware of resident's narcotic use and is in agreement with current plan of care. We will attempt to wean resident as appropriate.   Ok Edwards NP [facility]  Contact [phone] Monday through Friday 8am- 5pm  After hours call [phone]

## 2015-11-17 DIAGNOSIS — R627 Adult failure to thrive: Secondary | ICD-10-CM | POA: Insufficient documentation

## 2015-11-17 DIAGNOSIS — G8929 Other chronic pain: Secondary | ICD-10-CM | POA: Insufficient documentation

## 2015-11-17 DIAGNOSIS — L89154 Pressure ulcer of sacral region, stage 4: Secondary | ICD-10-CM | POA: Insufficient documentation

## 2015-11-21 DEATH — deceased

## 2015-12-23 IMAGING — CT CT HEAD W/O CM
2 of 7 series · 10 of 47 positions shown, 12 images · non-contrast
Comparison: CT of the head and cervical spine September 26, 2013

CLINICAL DATA: Possible unwitnessed fall. Left periorbital
hematoma.

EXAM:
CT HEAD WITHOUT CONTRAST
CT MAXILLOFACIAL WITHOUT CONTRAST
CT CERVICAL SPINE WITHOUT CONTRAST
TECHNIQUE: Multidetector CT imaging of the head, cervical spine, and
maxillofacial structures were performed using the standard protocol
without intravenous contrast. Multiplanar CT image reconstructions
of the cervical spine and maxillofacial structures were also
generated.

[Series 308: coronals st · coronal · 0.35mm/px · 8 of 66 slices shown, 10 images]
[im 8/66  brain]
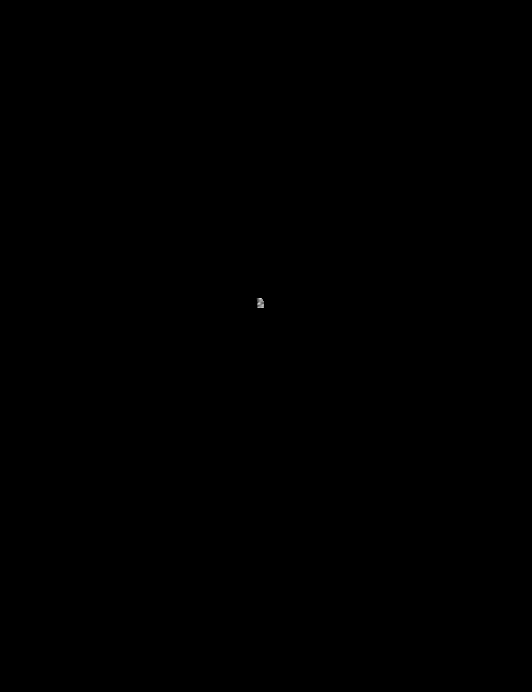
[im 8/66  bone]
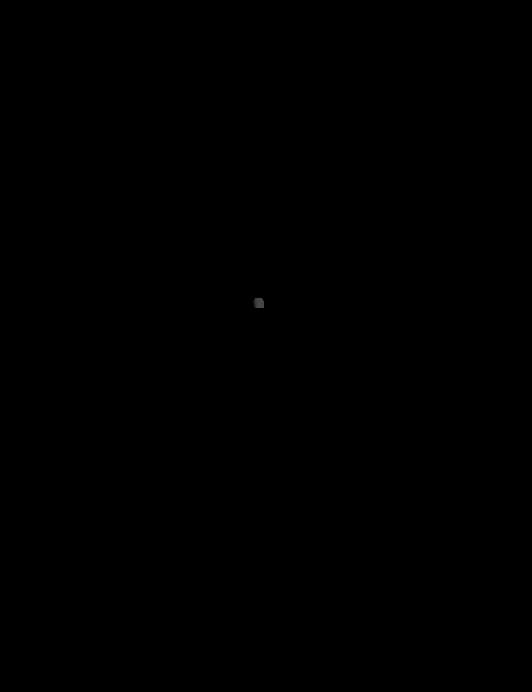
[im 15/66  brain]
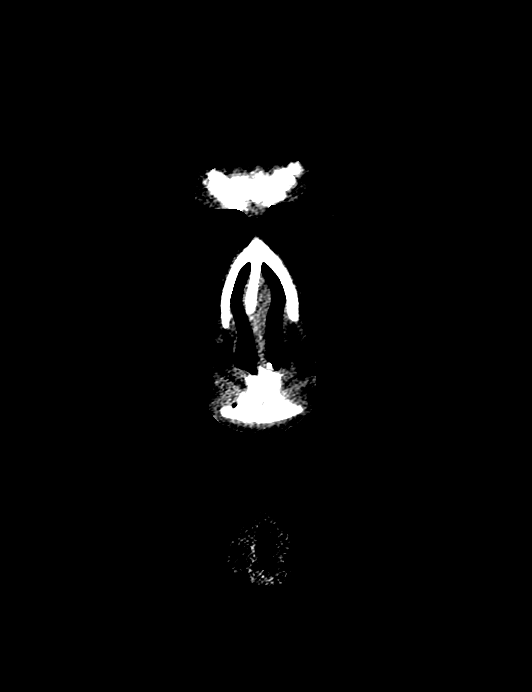
[im 22/66  brain]
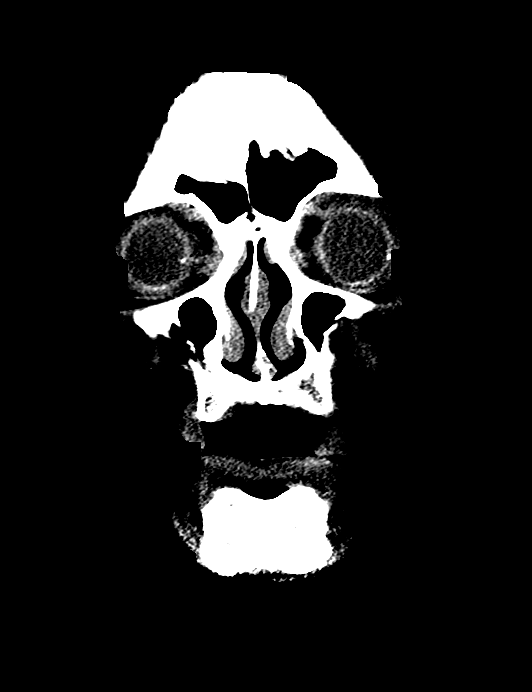
[im 29/66  brain]
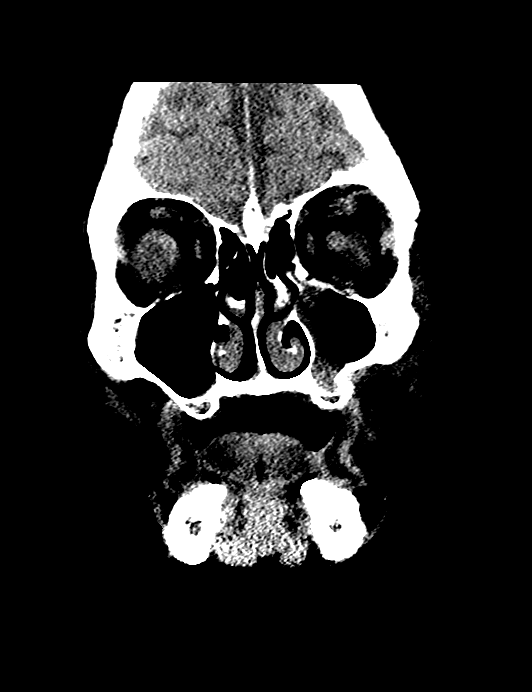
[im 37/66  brain]
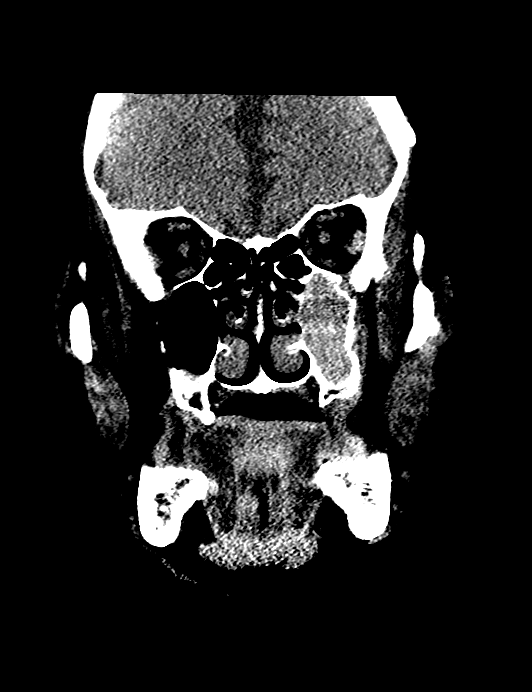
[im 37/66  bone]
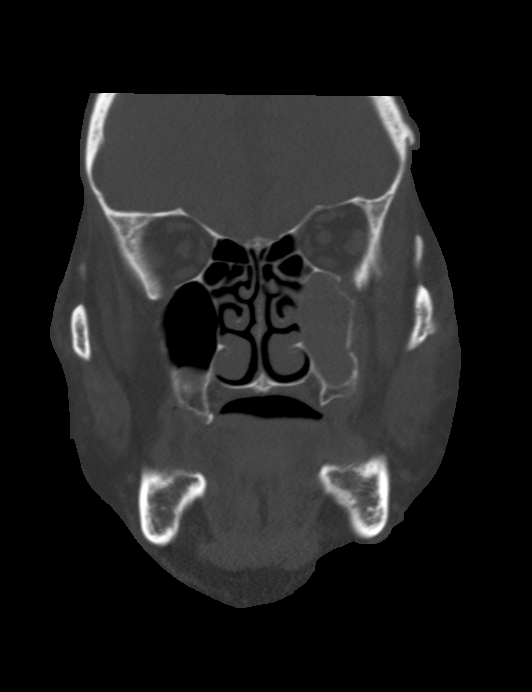
[im 44/66  brain]
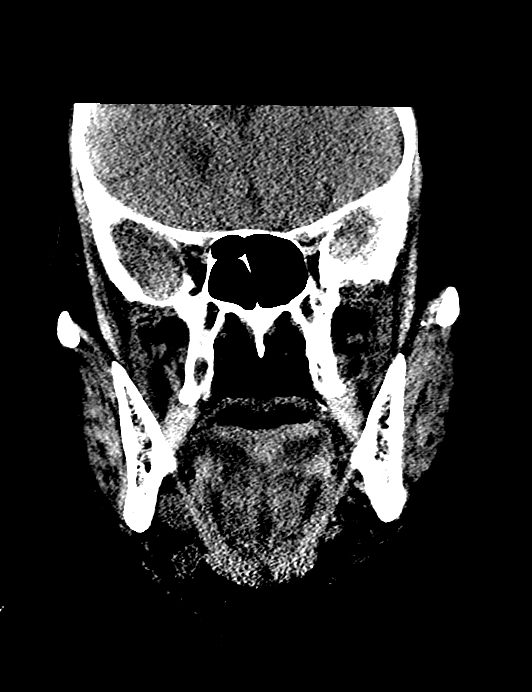
[im 51/66  brain]
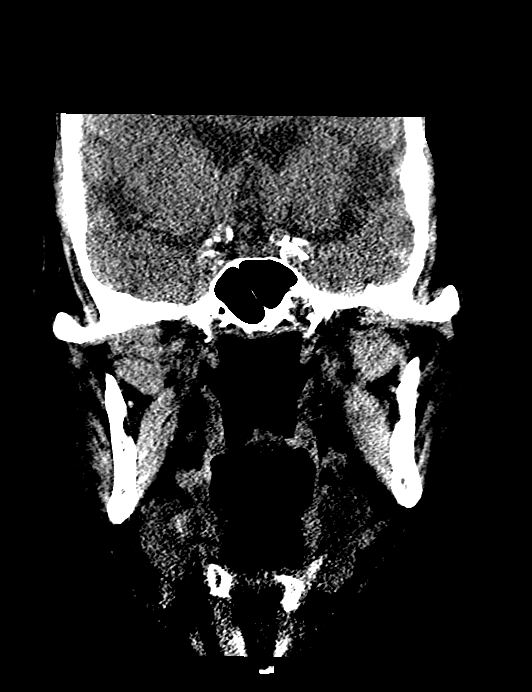
[im 58/66  brain]
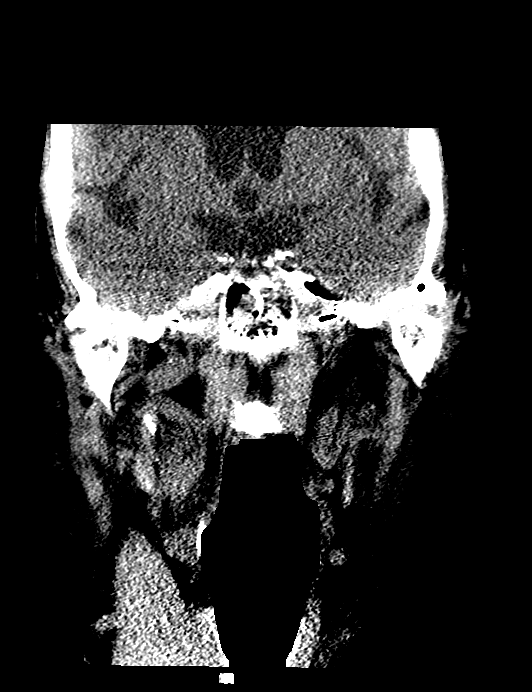

[Series 309: sagittals st · sagittal · 0.34mm/px · 2 of 77 slices shown]
[im 26/77  brain]
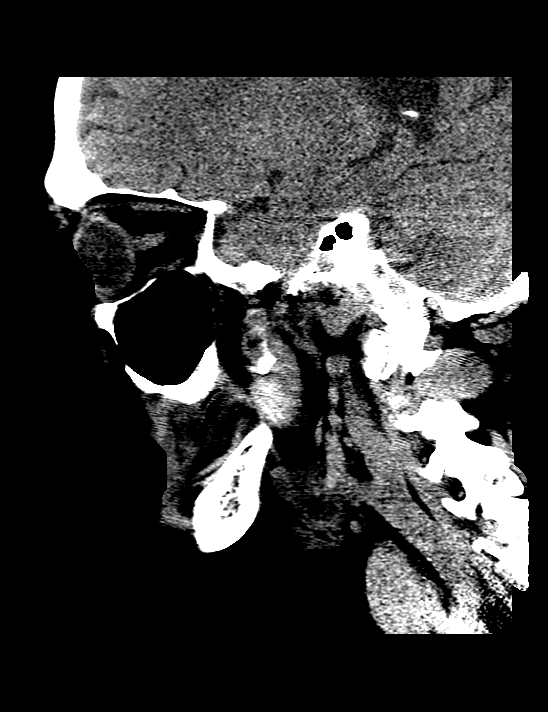
[im 51/77  brain]
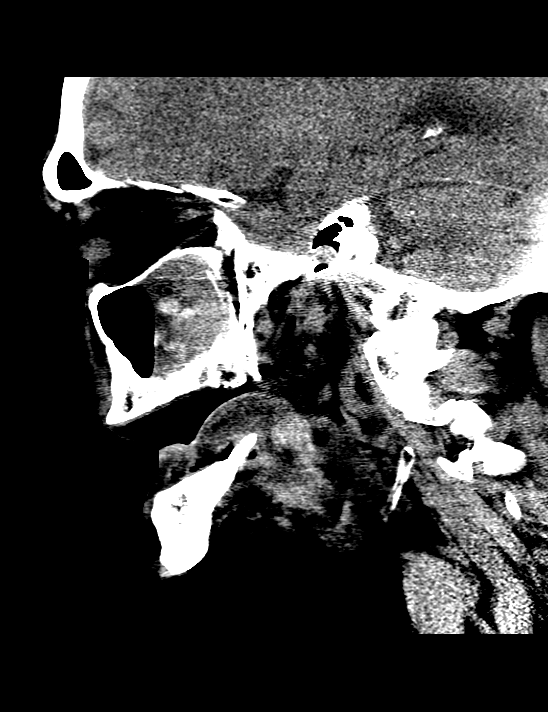

[10 of 47 positions shown; findings below may reference images not displayed]

FINDINGS: CT HEAD FINDINGS

3 mm right parafalcine acute subdural hematoma. In addition, 3 mm
left middle cranial fossa subdural hematoma appears acute. No
intraparenchymal hemorrhage, mass effect or midline shift.

Moderate ventriculomegaly, likely on the basis of global parenchymal
brain volume loss as there is overall commensurate enlargement of
cerebral sulci and cerebellar folia, similar. Patchy to confluent
supratentorial white matter hypodensities. No acute large vascular
territory infarct. Basal cisterns are patent. Severe calcific
atherosclerosis the carotid siphons. Nondisplaced fracture of the
left dx greater wing of the sphenoid.

CT MAXILLOFACIAL FINDINGS

Minimally depressed left anterior maxillary wall fracture, segmental
a mildly depressed left posterolateral maxillary wall fracture.
Segmental depressed left zygomatic arch fracture.

Nondisplaced left lateral orbital wall and rim fracture. Left
orbital floor fracture with 2 mm of depressed bony fragments,
minimal external herniation of extraconal fat.

Ocular globes intact. Extraocular muscles located without hematoma.
Preservation of the retrobulbar fat. Optic nerve sheath complexes
are unremarkable. Status post bilateral ocular lens implants. Mild
left periorbital soft tissue swelling without subcutaneous gas or
radiopaque foreign bodies.

Layering hyperdense blood products in left maxillary sinus, minimal
right maxillary and sphenoid air-fluid level of paranasal sinus
mucosal thickening. No destructive bony lesions. Patient is
edentulous. The mandible is intact and the condyles located.

CT CERVICAL SPINE FINDINGS

Cervical vertebral bodies are intact and aligned with straightened
cervical lordosis. Severe C4-5 through C6-7 degenerative disc
disease, mild at C3-4. C1-2 articulation maintained with moderate
arthropathy. Bone mineral density is decreased without destructive
bony lesions.

Mild chronic T2 compression fracture. Mild calcific atherosclerosis
of the carotid bulbs. Severe right greater than left
sternoclavicular osteoarthrosis.
IMPRESSION: CT head: Nondisplaced left greater wing of the sphenoid fracture
with 3 mm left middle cranial fossa subdural hematoma, 3 mm right
parafalcine subdural hematomas.

No intraparenchymal hemorrhage. Involutional changes. Moderate white
matter changes suggest chronic small vessel ischemic disease.

Maxillofacial CT: Left zygomaticomaxillary complex fracture. No
postseptal hematoma.

CT cervical spine: Straightened cervical lordosis without acute
fracture nor malalignment.

Critical Value/emergent results were called by telephone at the time
of interpretation on 10/01/2013 at [DATE] to Dr. SHIMANDA DICKEN , who
verbally acknowledged these results.

  By: Paulus N Ceejay

## 2015-12-30 ENCOUNTER — Other Ambulatory Visit: Payer: Self-pay

## 2015-12-30 ENCOUNTER — Ambulatory Visit: Payer: Self-pay | Admitting: Oncology
